# Patient Record
Sex: Male | Born: 1940 | Race: White | Hispanic: No | Marital: Married | Smoking: Current every day smoker
Health system: Southern US, Community
[De-identification: ages and names within clinical notes are randomized; demographics above are authoritative.]

## PROBLEM LIST (undated history)

## (undated) DIAGNOSIS — I499 Cardiac arrhythmia, unspecified: Secondary | ICD-10-CM

## (undated) DIAGNOSIS — C801 Malignant (primary) neoplasm, unspecified: Secondary | ICD-10-CM

## (undated) DIAGNOSIS — F32A Depression, unspecified: Secondary | ICD-10-CM

## (undated) DIAGNOSIS — R52 Pain, unspecified: Secondary | ICD-10-CM

## (undated) DIAGNOSIS — G2581 Restless legs syndrome: Secondary | ICD-10-CM

## (undated) DIAGNOSIS — M1990 Unspecified osteoarthritis, unspecified site: Secondary | ICD-10-CM

## (undated) DIAGNOSIS — F1010 Alcohol abuse, uncomplicated: Secondary | ICD-10-CM

## (undated) DIAGNOSIS — F329 Major depressive disorder, single episode, unspecified: Secondary | ICD-10-CM

## (undated) DIAGNOSIS — G629 Polyneuropathy, unspecified: Secondary | ICD-10-CM

## (undated) DIAGNOSIS — I10 Essential (primary) hypertension: Secondary | ICD-10-CM

## (undated) DIAGNOSIS — M8700 Idiopathic aseptic necrosis of unspecified bone: Secondary | ICD-10-CM

## (undated) DIAGNOSIS — F419 Anxiety disorder, unspecified: Secondary | ICD-10-CM

## (undated) DIAGNOSIS — W19XXXA Unspecified fall, initial encounter: Secondary | ICD-10-CM

## (undated) HISTORY — PX: EYE SURGERY: SHX253

## (undated) HISTORY — PX: HERNIA REPAIR: SHX51

## (undated) HISTORY — PX: KNEE ARTHROSCOPY W/ ACL RECONSTRUCTION: SHX1858

## (undated) HISTORY — PX: BRAIN SURGERY: SHX531

## (undated) HISTORY — PX: OTHER SURGICAL HISTORY: SHX169

## (undated) HISTORY — PX: JOINT REPLACEMENT: SHX530

## (undated) HISTORY — PX: FRACTURE SURGERY: SHX138

---

## 2011-06-23 ENCOUNTER — Emergency Department: Payer: Self-pay | Admitting: Emergency Medicine

## 2016-08-14 ENCOUNTER — Encounter: Payer: Self-pay | Admitting: *Deleted

## 2016-08-16 ENCOUNTER — Ambulatory Visit: Payer: No Typology Code available for payment source | Admitting: Certified Registered Nurse Anesthetist

## 2016-08-16 ENCOUNTER — Ambulatory Visit
Admission: RE | Admit: 2016-08-16 | Discharge: 2016-08-16 | Disposition: A | Discharge status: Home / Self Care | Payer: No Typology Code available for payment source | Source: Ambulatory Visit | Attending: Ophthalmology | Admitting: Ophthalmology

## 2016-08-16 ENCOUNTER — Encounter
Admission: RE | Disposition: A | Discharge status: Home / Self Care | Payer: Self-pay | Source: Ambulatory Visit | Attending: Ophthalmology

## 2016-08-16 DIAGNOSIS — M1990 Unspecified osteoarthritis, unspecified site: Secondary | ICD-10-CM | POA: Diagnosis not present

## 2016-08-16 DIAGNOSIS — G2581 Restless legs syndrome: Secondary | ICD-10-CM | POA: Insufficient documentation

## 2016-08-16 DIAGNOSIS — Z79899 Other long term (current) drug therapy: Secondary | ICD-10-CM | POA: Diagnosis not present

## 2016-08-16 DIAGNOSIS — G629 Polyneuropathy, unspecified: Secondary | ICD-10-CM | POA: Diagnosis not present

## 2016-08-16 DIAGNOSIS — H2511 Age-related nuclear cataract, right eye: Secondary | ICD-10-CM | POA: Insufficient documentation

## 2016-08-16 DIAGNOSIS — J449 Chronic obstructive pulmonary disease, unspecified: Secondary | ICD-10-CM | POA: Insufficient documentation

## 2016-08-16 DIAGNOSIS — F329 Major depressive disorder, single episode, unspecified: Secondary | ICD-10-CM | POA: Insufficient documentation

## 2016-08-16 DIAGNOSIS — I10 Essential (primary) hypertension: Secondary | ICD-10-CM | POA: Insufficient documentation

## 2016-08-16 DIAGNOSIS — E7800 Pure hypercholesterolemia, unspecified: Secondary | ICD-10-CM | POA: Insufficient documentation

## 2016-08-16 DIAGNOSIS — F419 Anxiety disorder, unspecified: Secondary | ICD-10-CM | POA: Diagnosis not present

## 2016-08-16 DIAGNOSIS — C9000 Multiple myeloma not having achieved remission: Secondary | ICD-10-CM | POA: Diagnosis not present

## 2016-08-16 DIAGNOSIS — F17200 Nicotine dependence, unspecified, uncomplicated: Secondary | ICD-10-CM | POA: Insufficient documentation

## 2016-08-16 HISTORY — DX: Cardiac arrhythmia, unspecified: I49.9

## 2016-08-16 HISTORY — PX: CATARACT EXTRACTION W/PHACO: SHX586

## 2016-08-16 HISTORY — DX: Pain, unspecified: R52

## 2016-08-16 HISTORY — DX: Polyneuropathy, unspecified: G62.9

## 2016-08-16 HISTORY — DX: Restless legs syndrome: G25.81

## 2016-08-16 HISTORY — DX: Malignant (primary) neoplasm, unspecified: C80.1

## 2016-08-16 HISTORY — DX: Anxiety disorder, unspecified: F41.9

## 2016-08-16 HISTORY — DX: Unspecified fall, initial encounter: W19.XXXA

## 2016-08-16 HISTORY — DX: Unspecified osteoarthritis, unspecified site: M19.90

## 2016-08-16 HISTORY — DX: Major depressive disorder, single episode, unspecified: F32.9

## 2016-08-16 HISTORY — DX: Idiopathic aseptic necrosis of unspecified bone: M87.00

## 2016-08-16 HISTORY — DX: Alcohol abuse, uncomplicated: F10.10

## 2016-08-16 HISTORY — DX: Depression, unspecified: F32.A

## 2016-08-16 SURGERY — PHACOEMULSIFICATION, CATARACT, WITH IOL INSERTION
Anesthesia: Monitor Anesthesia Care | Site: Eye | Laterality: Right | Wound class: Clean

## 2016-08-16 MED ORDER — MOXIFLOXACIN HCL 0.5 % OP SOLN
OPHTHALMIC | Status: AC
  Filled 2016-08-16: qty 3

## 2016-08-16 MED ORDER — LIDOCAINE HCL (PF) 4 % IJ SOLN
INTRAMUSCULAR | Status: AC
  Filled 2016-08-16: qty 5

## 2016-08-16 MED ORDER — SODIUM HYALURONATE 23 MG/ML IO SOLN
INTRAOCULAR | Status: AC
  Filled 2016-08-16: qty 0.6

## 2016-08-16 MED ORDER — MIDAZOLAM HCL 2 MG/2ML IJ SOLN
INTRAMUSCULAR | Status: DC | PRN
  Administered 2016-08-16: 1 mg via INTRAVENOUS

## 2016-08-16 MED ORDER — EPINEPHRINE PF 1 MG/ML IJ SOLN
INTRAMUSCULAR | Status: DC | PRN
  Administered 2016-08-16: 250 mL via OPHTHALMIC

## 2016-08-16 MED ORDER — POVIDONE-IODINE 5 % OP SOLN
OPHTHALMIC | Status: AC
  Filled 2016-08-16: qty 30

## 2016-08-16 MED ORDER — SODIUM CHLORIDE 0.9 % IV SOLN
INTRAVENOUS | Status: DC
  Administered 2016-08-16: 11:00:00 via INTRAVENOUS

## 2016-08-16 MED ORDER — FENTANYL CITRATE (PF) 100 MCG/2ML IJ SOLN
INTRAMUSCULAR | Status: DC | PRN
  Administered 2016-08-16: 50 ug via INTRAVENOUS

## 2016-08-16 MED ORDER — SODIUM HYALURONATE 23 MG/ML IO SOLN
INTRAOCULAR | Status: DC | PRN
  Administered 2016-08-16: 0.6 mL via INTRAOCULAR

## 2016-08-16 MED ORDER — MOXIFLOXACIN HCL 0.5 % OP SOLN
OPHTHALMIC | Status: DC | PRN
  Administered 2016-08-16: 5 [drp] via OPHTHALMIC

## 2016-08-16 MED ORDER — SODIUM HYALURONATE 10 MG/ML IO SOLN
INTRAOCULAR | Status: DC | PRN
  Administered 2016-08-16: 0.85 mL via INTRAOCULAR

## 2016-08-16 MED ORDER — SODIUM HYALURONATE 10 MG/ML IO SOLN
INTRAOCULAR | Status: AC
  Filled 2016-08-16: qty 0.85

## 2016-08-16 MED ORDER — EPINEPHRINE PF 1 MG/ML IJ SOLN
INTRAMUSCULAR | Status: AC
  Filled 2016-08-16: qty 2

## 2016-08-16 MED ORDER — CYCLOPENTOLATE HCL 2 % OP SOLN
OPHTHALMIC | Status: AC
  Filled 2016-08-16: qty 2

## 2016-08-16 MED ORDER — PHENYLEPHRINE HCL 10 % OP SOLN
OPHTHALMIC | Status: AC
  Filled 2016-08-16: qty 5

## 2016-08-16 MED ORDER — ARMC OPHTHALMIC DILATING DROPS
1.0000 "application " | OPHTHALMIC | Status: AC
  Administered 2016-08-16 (×3): 1 via OPHTHALMIC

## 2016-08-16 MED ORDER — LIDOCAINE HCL (PF) 4 % IJ SOLN
INTRAOCULAR | Status: DC | PRN
  Administered 2016-08-16: 4 mL via OPHTHALMIC

## 2016-08-16 MED ORDER — MOXIFLOXACIN HCL 0.5 % OP SOLN: 1.0000 [drp] | OPHTHALMIC | Status: DC | PRN

## 2016-08-16 SURGICAL SUPPLY — 21 items
CANNULA ANT/CHMB 27GA (MISCELLANEOUS) ×6 IMPLANT
CUP MEDICINE 2OZ PLAST GRAD ST (MISCELLANEOUS) ×3 IMPLANT
GLOVE BIO SURGEON STRL SZ8 (GLOVE) ×3 IMPLANT
GLOVE BIOGEL M 6.5 STRL (GLOVE) ×3 IMPLANT
GLOVE SURG LX 7.5 STRW (GLOVE) ×2
GLOVE SURG LX STRL 7.5 STRW (GLOVE) ×1 IMPLANT
GOWN STRL REUS W/ TWL LRG LVL3 (GOWN DISPOSABLE) ×2 IMPLANT
GOWN STRL REUS W/TWL LRG LVL3 (GOWN DISPOSABLE) ×4
LENS IOL TECNIS ITEC 20.5 (Intraocular Lens) ×3 IMPLANT
PACK CATARACT (MISCELLANEOUS) ×3 IMPLANT
PACK CATARACT BRASINGTON LX (MISCELLANEOUS) ×3 IMPLANT
PACK EYE AFTER SURG (MISCELLANEOUS) ×3 IMPLANT
SOL BSS BAG (MISCELLANEOUS) ×3
SOL PREP PVP 2OZ (MISCELLANEOUS) ×3
SOLUTION BSS BAG (MISCELLANEOUS) ×1 IMPLANT
SOLUTION PREP PVP 2OZ (MISCELLANEOUS) ×1 IMPLANT
SYR 3ML LL SCALE MARK (SYRINGE) ×6 IMPLANT
SYR 5ML LL (SYRINGE) ×3 IMPLANT
SYR TB 1ML 27GX1/2 LL (SYRINGE) ×3 IMPLANT
WATER STERILE IRR 250ML POUR (IV SOLUTION) ×3 IMPLANT
WIPE NON LINTING 3.25X3.25 (MISCELLANEOUS) ×3 IMPLANT

## 2016-08-16 NOTE — H&P (Signed)
The History and Physical notes are on paper, have been signed, and are to be scanned. The patient remains stable and unchanged from the H&P.   Previous H&P reviewed, patient examined, and there are no changes.  Benay Pillow 08/16/2016 12:14 PM

## 2016-08-16 NOTE — Discharge Instructions (Signed)
Eye Surgery Discharge Instructions  Expect mild scratchy sensation or mild soreness. DO NOT RUB YOUR EYE!  The day of surgery:  Minimal physical activity, but bed rest is not required  No reading, computer work, or close hand work  No bending, lifting, or straining.  May watch TV  For 24 hours:  No driving, legal decisions, or alcoholic beverages  Safety precautions  Eat anything you prefer: It is better to start with liquids, then soup then solid foods.  _____ Eye patch should be worn until postoperative exam tomorrow.  ____ Solar shield eyeglasses should be worn for comfort in the sunlight/patch while sleeping  Resume all regular medications including aspirin or Coumadin if these were discontinued prior to surgery. You may shower, bathe, shave, or wash your hair. Tylenol may be taken for mild discomfort.  Call your doctor if you experience significant pain, nausea, or vomiting, fever > 101 or other signs of infection. [phone] or [phone] Specific instructions:  Follow-up Information    Benay Pillow, MD Follow up on 08/17/2016.   Specialty:  Ophthalmology Why:  8:50 am Contact information: [place] [address] [phone]          General Anesthesia, Adult, Care After Refer to this sheet in the next few weeks. These instructions provide you with information on caring for yourself after your procedure. Your health care provider may also give you more specific instructions. Your treatment has been planned according to current medical practices, but problems sometimes occur. Call your health care provider if you have any problems or questions after your procedure. WHAT TO EXPECT AFTER THE PROCEDURE After the procedure, it is typical to experience:  Sleepiness.  Nausea and vomiting. HOME CARE INSTRUCTIONS  For the first 24 hours after general anesthesia:  Have a responsible person with you.  Do not drive a car. If you are alone, do  not take public transportation.  Do not drink alcohol.  Do not take medicine that has not been prescribed by your health care provider.  Do not sign important papers or make important decisions.  You may resume a normal diet and activities as directed by your health care provider.  Change bandages (dressings) as directed.  If you have questions or problems that seem related to general anesthesia, call the hospital and ask for the anesthetist or anesthesiologist on call. SEEK MEDICAL CARE IF:  You have nausea and vomiting that continue the day after anesthesia.  You develop a rash. SEEK IMMEDIATE MEDICAL CARE IF:   You have difficulty breathing.  You have chest pain.  You have any allergic problems.   This information is not intended to replace advice given to you by your health care provider. Make sure you discuss any questions you have with your health care provider.   Document Released: 01/28/2001 Document Revised: 11/12/2014 Document Reviewed: 02/20/2012 Elsevier Interactive Patient Education [employer].

## 2016-08-16 NOTE — Anesthesia Preprocedure Evaluation (Signed)
Anesthesia Evaluation  Patient identified by MRN, date of birth, ID band Patient awake    Reviewed: Allergy & Precautions, NPO status , Patient's Chart, lab work & pertinent test results  Airway Mallampati: II       Dental  (+) Teeth Intact   Pulmonary neg pulmonary ROS, COPD, Current Smoker,     + decreased breath sounds      Cardiovascular Exercise Tolerance: Good hypertension, Pt. on medications and Pt. on home beta blockers  Rhythm:Regular Rate:Normal     Neuro/Psych Anxiety Depression Hx of subd. heamatoma    GI/Hepatic negative GI ROS, Neg liver ROS,   Endo/Other  negative endocrine ROS  Renal/GU negative Renal ROS     Musculoskeletal   Abdominal   Peds  Hematology   Anesthesia Other Findings   Reproductive/Obstetrics                             Anesthesia Physical Anesthesia Plan  ASA: III  Anesthesia Plan: MAC   Post-op Pain Management:    Induction: Intravenous  Airway Management Planned: Natural Airway and Nasal Cannula  Additional Equipment:   Intra-op Plan:   Post-operative Plan:   Informed Consent: I have reviewed the patients History and Physical, chart, labs and discussed the procedure including the risks, benefits and alternatives for the proposed anesthesia with the patient or authorized representative who has indicated his/her understanding and acceptance.     Plan Discussed with: CRNA  Anesthesia Plan Comments:         Anesthesia Quick Evaluation

## 2016-08-16 NOTE — Anesthesia Postprocedure Evaluation (Signed)
Anesthesia Post Note  Patient: KEMONTAE NAZZAL  Procedure(s) Performed: Procedure(s) (LRB): CATARACT EXTRACTION PHACO AND INTRAOCULAR LENS PLACEMENT (IOC) (Right)  Patient location during evaluation: PACU Anesthesia Type: MAC Level of consciousness: awake and alert and oriented Pain management: satisfactory to patient Vital Signs Assessment: post-procedure vital signs reviewed and stable Respiratory status: respiratory function stable Cardiovascular status: blood pressure returned to baseline    Last Vitals:  Vitals:   08/16/16 1107  BP: 130/82  Pulse: 64  Resp: 16  Temp: 36.7 C    Last Pain:  Vitals:   08/16/16 1107  TempSrc: Oral                 Blima Singer

## 2016-08-16 NOTE — Transfer of Care (Signed)
Immediate Anesthesia Transfer of Care Note  Patient: Ryan Travis  Procedure(s) Performed: Procedure(s) with comments: CATARACT EXTRACTION PHACO AND INTRAOCULAR LENS PLACEMENT (IOC) (Right) - Lot # [id number] H [place]: 01:00.8 AP%:16.5 CDE: 10.02   Patient Location: PACU  Anesthesia Type:MAC  Level of Consciousness: awake, alert  and oriented  Airway & Oxygen Therapy: Patient Spontanous Breathing  Post-op Assessment: Report given to RN and Post -op Vital signs reviewed and stable  Post vital signs: Reviewed and stable  Last Vitals:  Vitals:   08/16/16 1107  BP: 130/82  Pulse: 64  Resp: 16  Temp: 36.7 C    Last Pain:  Vitals:   08/16/16 1107  TempSrc: Oral         Complications: No apparent anesthesia complications

## 2016-08-16 NOTE — Anesthesia Procedure Notes (Signed)
Procedure Name: MAC Performed by: Jamillah Camilo Pre-anesthesia Checklist: Patient identified, Emergency Drugs available, Suction available, Patient being monitored and Timeout performed Oxygen Delivery Method: Nasal cannula       

## 2016-08-16 NOTE — Op Note (Signed)
OPERATIVE NOTE  Ryan Travis [id number] 08/16/2016   PREOPERATIVE DIAGNOSIS:  Nuclear sclerotic cataract right eye.  H25.11   POSTOPERATIVE DIAGNOSIS:    Nuclear sclerotic cataract right eye.     PROCEDURE:  Phacoemusification with posterior chamber intraocular lens placement of the right eye   LENS:   Implant Name Type Inv. Item Serial No. Manufacturer Lot No. LRB No. Used  LENS IOL DIOP 20.5 - [id number] Intraocular Lens LENS IOL DIOP 20.5 [id number] AMO   Right 1       PCB00 +20.5   ULTRASOUND TIME: 1 minutes 00 seconds.  CDE 10.02   SURGEON:  Benay Pillow, MD, MPH  ANESTHESIOLOGIST: Anesthesiologist: Iver Nestle, MD CRNA: Demetrius Charity, CRNA   ANESTHESIA:  Topical with tetracaine drops, augmented with 1% preservative-free intracameral lidocaine.  ESTIMATED BLOOD LOSS: less than 1 mL.   COMPLICATIONS:  None.   DESCRIPTION OF PROCEDURE:  The patient was identified in the holding room and transported to the operating room and placed in the supine position under the operating microscope.  The right eye was identified as the operative eye and it was prepped and draped in the usual sterile ophthalmic fashion.   A 1.0 millimeter clear-corneal paracentesis was made at the 10:30 position. 0.5 ml of preservative-free 1% lidocaine with epinephrine was injected into the anterior chamber.  The anterior chamber was filled with Healon 5 viscoelastic.  A 2.4 millimeter keratome was used to make a near-clear corneal incision at the 8:00 position.  A curvilinear capsulorrhexis was made with a cystotome and capsulorrhexis forceps.  Balanced salt solution was used to hydrodissect and hydrodelineate the nucleus.   Phacoemulsification was then used in stop and chop fashion to remove the lens nucleus and epinucleus.  The remaining cortex was then removed using the irrigation and aspiration handpiece. Healon was then placed into the capsular bag to distend it for lens  placement.  A lens was then injected into the capsular bag.  The remaining viscoelastic was aspirated.   Wounds were hydrated with balanced salt solution.  The anterior chamber was inflated to a physiologic pressure with balanced salt solution.    Intracameral vigamox 0.1 mL undiluted was injected into the eye.  No wound leaks were noted.  Topical Vigamox drops were applied to the eye.  The patient was taken to the recovery room in stable condition without complications of anesthesia or surgery  Benay Pillow 08/16/2016, 1:12 PM

## 2016-09-06 ENCOUNTER — Ambulatory Visit: Admit: 2016-09-06 | Payer: No Typology Code available for payment source | Admitting: Ophthalmology

## 2016-09-06 SURGERY — PHACOEMULSIFICATION, CATARACT, WITH IOL INSERTION
Anesthesia: Choice | Laterality: Left

## 2016-10-02 ENCOUNTER — Encounter: Payer: Self-pay | Admitting: *Deleted

## 2016-10-04 ENCOUNTER — Ambulatory Visit
Admission: RE | Admit: 2016-10-04 | Discharge: 2016-10-04 | Disposition: A | Discharge status: Home / Self Care | Payer: No Typology Code available for payment source | Source: Ambulatory Visit | Attending: Ophthalmology | Admitting: Ophthalmology

## 2016-10-04 ENCOUNTER — Ambulatory Visit: Payer: No Typology Code available for payment source | Admitting: Anesthesiology

## 2016-10-04 ENCOUNTER — Encounter: Payer: Self-pay | Admitting: *Deleted

## 2016-10-04 ENCOUNTER — Encounter
Admission: RE | Disposition: A | Discharge status: Home / Self Care | Payer: Self-pay | Source: Ambulatory Visit | Attending: Ophthalmology

## 2016-10-04 DIAGNOSIS — M879 Osteonecrosis, unspecified: Secondary | ICD-10-CM | POA: Diagnosis not present

## 2016-10-04 DIAGNOSIS — G629 Polyneuropathy, unspecified: Secondary | ICD-10-CM | POA: Diagnosis not present

## 2016-10-04 DIAGNOSIS — F17200 Nicotine dependence, unspecified, uncomplicated: Secondary | ICD-10-CM | POA: Diagnosis not present

## 2016-10-04 DIAGNOSIS — Z85828 Personal history of other malignant neoplasm of skin: Secondary | ICD-10-CM | POA: Diagnosis not present

## 2016-10-04 DIAGNOSIS — M1990 Unspecified osteoarthritis, unspecified site: Secondary | ICD-10-CM | POA: Diagnosis not present

## 2016-10-04 DIAGNOSIS — F329 Major depressive disorder, single episode, unspecified: Secondary | ICD-10-CM | POA: Diagnosis not present

## 2016-10-04 DIAGNOSIS — I10 Essential (primary) hypertension: Secondary | ICD-10-CM | POA: Insufficient documentation

## 2016-10-04 DIAGNOSIS — F419 Anxiety disorder, unspecified: Secondary | ICD-10-CM | POA: Insufficient documentation

## 2016-10-04 DIAGNOSIS — H2512 Age-related nuclear cataract, left eye: Secondary | ICD-10-CM | POA: Insufficient documentation

## 2016-10-04 HISTORY — DX: Essential (primary) hypertension: I10

## 2016-10-04 HISTORY — PX: CATARACT EXTRACTION W/PHACO: SHX586

## 2016-10-04 SURGERY — PHACOEMULSIFICATION, CATARACT, WITH IOL INSERTION
Anesthesia: Monitor Anesthesia Care | Site: Eye | Laterality: Left | Wound class: Clean

## 2016-10-04 MED ORDER — SODIUM HYALURONATE 10 MG/ML IO SOLN
INTRAOCULAR | Status: AC
  Filled 2016-10-04: qty 0.85

## 2016-10-04 MED ORDER — MOXIFLOXACIN HCL 0.5 % OP SOLN
OPHTHALMIC | Status: DC | PRN
  Administered 2016-10-04: 1 [drp] via OPHTHALMIC

## 2016-10-04 MED ORDER — FENTANYL CITRATE (PF) 100 MCG/2ML IJ SOLN
INTRAMUSCULAR | Status: DC | PRN
  Administered 2016-10-04: 50 ug via INTRAVENOUS

## 2016-10-04 MED ORDER — MOXIFLOXACIN HCL 0.5 % OP SOLN
OPHTHALMIC | Status: AC
  Filled 2016-10-04: qty 3

## 2016-10-04 MED ORDER — POVIDONE-IODINE 5 % OP SOLN
OPHTHALMIC | Status: DC | PRN
Start: 2016-10-04 — End: 2016-10-04
  Administered 2016-10-04: 1 via OPHTHALMIC

## 2016-10-04 MED ORDER — SODIUM HYALURONATE 10 MG/ML IO SOLN
INTRAOCULAR | Status: DC | PRN
  Administered 2016-10-04: 0.85 mL via INTRAOCULAR

## 2016-10-04 MED ORDER — EPINEPHRINE PF 1 MG/ML IJ SOLN
INTRAOCULAR | Status: DC | PRN
  Administered 2016-10-04: 10:00:00 via OPHTHALMIC

## 2016-10-04 MED ORDER — LIDOCAINE HCL (PF) 4 % IJ SOLN
INTRAOCULAR | Status: DC | PRN
  Administered 2016-10-04: 4 mL via OPHTHALMIC

## 2016-10-04 MED ORDER — CARBACHOL 0.01 % IO SOLN
INTRAOCULAR | Status: DC | PRN
  Administered 2016-10-04: 0.5 mL via INTRAOCULAR

## 2016-10-04 MED ORDER — POVIDONE-IODINE 5 % OP SOLN
OPHTHALMIC | Status: AC
  Filled 2016-10-04: qty 30

## 2016-10-04 MED ORDER — ARMC OPHTHALMIC DILATING DROPS
OPHTHALMIC | Status: AC
  Filled 2016-10-04: qty 0.4

## 2016-10-04 MED ORDER — MOXIFLOXACIN HCL 0.5 % OP SOLN: 1.0000 [drp] | OPHTHALMIC | Status: DC | PRN

## 2016-10-04 MED ORDER — EPINEPHRINE PF 1 MG/ML IJ SOLN
INTRAMUSCULAR | Status: AC
  Filled 2016-10-04: qty 2

## 2016-10-04 MED ORDER — SODIUM HYALURONATE 23 MG/ML IO SOLN
INTRAOCULAR | Status: DC | PRN
  Administered 2016-10-04: 0.6 mL via INTRAOCULAR

## 2016-10-04 MED ORDER — ARMC OPHTHALMIC DILATING DROPS
1.0000 "application " | OPHTHALMIC | Status: AC
  Administered 2016-10-04 (×3): 1 via OPHTHALMIC

## 2016-10-04 MED ORDER — LIDOCAINE HCL (PF) 4 % IJ SOLN
INTRAMUSCULAR | Status: AC
  Filled 2016-10-04: qty 5

## 2016-10-04 MED ORDER — SODIUM HYALURONATE 23 MG/ML IO SOLN
INTRAOCULAR | Status: AC
  Filled 2016-10-04: qty 0.6

## 2016-10-04 MED ORDER — MIDAZOLAM HCL 2 MG/2ML IJ SOLN
INTRAMUSCULAR | Status: DC | PRN
  Administered 2016-10-04: 1 mg via INTRAVENOUS

## 2016-10-04 MED ORDER — SODIUM CHLORIDE 0.9 % IV SOLN
INTRAVENOUS | Status: DC
  Administered 2016-10-04: 09:00:00 via INTRAVENOUS

## 2016-10-04 SURGICAL SUPPLY — 23 items
CANNULA ANT/CHMB 27GA (MISCELLANEOUS) ×6 IMPLANT
CUP MEDICINE 2OZ PLAST GRAD ST (MISCELLANEOUS) ×3 IMPLANT
DISSECTOR HYDRO NUCLEUS 50X22 (MISCELLANEOUS) ×3 IMPLANT
GLOVE BIO SURGEON STRL SZ8 (GLOVE) ×3 IMPLANT
GLOVE BIOGEL M 6.5 STRL (GLOVE) ×3 IMPLANT
GLOVE SURG LX 7.5 STRW (GLOVE) ×2
GLOVE SURG LX STRL 7.5 STRW (GLOVE) ×1 IMPLANT
GOWN STRL REUS W/ TWL LRG LVL3 (GOWN DISPOSABLE) ×2 IMPLANT
GOWN STRL REUS W/TWL LRG LVL3 (GOWN DISPOSABLE) ×4
LENS IOL TECNIS ITEC 17.5 (Intraocular Lens) ×3 IMPLANT
NEEDLE CAPSULORHEX 25GA (NEEDLE) ×3 IMPLANT
PACK CATARACT (MISCELLANEOUS) ×3 IMPLANT
PACK CATARACT BRASINGTON LX (MISCELLANEOUS) ×3 IMPLANT
PACK EYE AFTER SURG (MISCELLANEOUS) ×3 IMPLANT
SOL BSS BAG (MISCELLANEOUS) ×3
SOL PREP PVP 2OZ (MISCELLANEOUS) ×3
SOLUTION BSS BAG (MISCELLANEOUS) ×1 IMPLANT
SOLUTION PREP PVP 2OZ (MISCELLANEOUS) ×1 IMPLANT
SYR 3ML LL SCALE MARK (SYRINGE) ×6 IMPLANT
SYR 5ML LL (SYRINGE) ×3 IMPLANT
SYR TB 1ML 27GX1/2 LL (SYRINGE) ×3 IMPLANT
WATER STERILE IRR 250ML POUR (IV SOLUTION) ×3 IMPLANT
WIPE NON LINTING 3.25X3.25 (MISCELLANEOUS) ×3 IMPLANT

## 2016-10-04 NOTE — Transfer of Care (Signed)
Immediate Anesthesia Transfer of Care Note  Patient: [name]  Procedure(s) Performed: Procedure(s) with comments: CATARACT EXTRACTION PHACO AND INTRAOCULAR LENS PLACEMENT (IOC) (Left) - [place] 35.4 AP% 10.5 CDE 4.11 Fluid pack lot # [id number]  Patient Location: PACU  Anesthesia Type:MAC  Level of Consciousness: awake, alert  and oriented  Airway & Oxygen Therapy: Patient Spontanous Breathing  Post-op Assessment: Report given to RN and Post -op Vital signs reviewed and stable  Post vital signs: Reviewed and stable  Last Vitals:  Vitals:   10/04/16 0849 10/04/16 1050  BP: 111/73 127/88  Pulse: 88 72  Resp: 20 18  Temp: 36.5 C     Last Pain:  Vitals:   10/04/16 1050  TempSrc: Temporal         Complications: No apparent anesthesia complications

## 2016-10-04 NOTE — OR Nursing (Signed)
IV site clear

## 2016-10-04 NOTE — H&P (Signed)
The History and Physical notes are on paper, have been signed, and are to be scanned. The patient remains stable and unchanged from the H&P.   Previous H&P reviewed, patient examined, and there are no changes.  Ryan Travis 10/04/2016 10:11 AM

## 2016-10-04 NOTE — Anesthesia Preprocedure Evaluation (Signed)
Anesthesia Evaluation  Patient identified by MRN, date of birth, ID band Patient awake    Reviewed: Allergy & Precautions, NPO status , Patient's Chart, lab work & pertinent test results  History of Anesthesia Complications Negative for: history of anesthetic complications  Airway Mallampati: II  TM Distance: >3 FB Neck ROM: Full    Dental no notable dental hx.    Pulmonary neg sleep apnea, neg COPD, Current Smoker,    breath sounds clear to auscultation- rhonchi (-) wheezing      Cardiovascular Exercise Tolerance: Good hypertension, (-) CAD and (-) Past MI  Rhythm:Regular Rate:Normal - Systolic murmurs and - Diastolic murmurs    Neuro/Psych Anxiety Depression negative neurological ROS     GI/Hepatic negative GI ROS, Neg liver ROS,   Endo/Other  negative endocrine ROSneg diabetes  Renal/GU negative Renal ROS     Musculoskeletal  (+) Arthritis ,   Abdominal (+) - obese,   Peds  Hematology   Anesthesia Other Findings Past Medical History: No date: Alcohol abuse No date: Anxiety No date: Arthritis 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[facility]) No date: Cancer ([place])     Comment: multiple myeloma No date: Depression No date: Dysrhythmia No date: Fall     Comment: 3 weeks ago/ torn ligaments left knee/wearing               brace/pt No date: Hypertension No date: Neuropathy ([place]) No date: Pain     Comment: chronic leg and ankle pain No date: RLS (restless legs syndrome)   Reproductive/Obstetrics                             Anesthesia Physical Anesthesia Plan  ASA: II  Anesthesia Plan: MAC   Post-op Pain Management:    Induction: Intravenous  Airway Management Planned: Natural Airway  Additional Equipment:   Intra-op Plan:   Post-operative Plan:   Informed Consent: I have reviewed the patients History and Physical, chart, labs and discussed the procedure including the risks,  benefits and alternatives for the proposed anesthesia with the patient or authorized representative who has indicated his/her understanding and acceptance.   Dental advisory given  Plan Discussed with: CRNA and Anesthesiologist  Anesthesia Plan Comments:         Anesthesia Quick Evaluation

## 2016-10-04 NOTE — Anesthesia Postprocedure Evaluation (Signed)
Anesthesia Post Note  Patient: TREVIOUS FOUCHE  Procedure(s) Performed: Procedure(s) (LRB): CATARACT EXTRACTION PHACO AND INTRAOCULAR LENS PLACEMENT (IOC) (Left)  Patient location during evaluation: PACU Anesthesia Type: MAC Level of consciousness: awake and alert Pain management: pain level controlled Vital Signs Assessment: post-procedure vital signs reviewed and stable Respiratory status: spontaneous breathing, nonlabored ventilation and respiratory function stable Cardiovascular status: stable and blood pressure returned to baseline Anesthetic complications: no    Last Vitals:  Vitals:   10/04/16 0849 10/04/16 1050  BP: 111/73 127/88  Pulse: 88 72  Resp: 20 18  Temp: 36.5 C 36.7 C    Last Pain:  Vitals:   10/04/16 1050  TempSrc: Temporal                 Shakeeta Godette

## 2016-10-04 NOTE — Discharge Instructions (Signed)
Eye Surgery Discharge Instructions  Expect mild scratchy sensation or mild soreness. DO NOT RUB YOUR EYE!  The day of surgery: . Minimal physical activity, but bed rest is not required . No reading, computer work, or close hand work . No bending, lifting, or straining. . May watch TV  For 24 hours: . No driving, legal decisions, or alcoholic beverages . Safety precautions . Eat anything you prefer: It is better to start with liquids, then soup then solid foods. . _____ Eye patch should be worn until postoperative exam tomorrow. . ____ Solar shield eyeglasses should be worn for comfort in the sunlight/patch while sleeping  Resume all regular medications including aspirin or Coumadin if these were discontinued prior to surgery. You may shower, bathe, shave, or wash your hair. Tylenol may be taken for mild discomfort.  Call your doctor if you experience significant pain, nausea, or vomiting, fever > 101 or other signs of infection. [phone] or [phone] Specific instructions:  Follow-up Information    Benay Pillow, MD Follow up on 10/05/2016.   Specialty:  Ophthalmology Why:  at9:40 am Contact information: [address] [place] [address] [phone]

## 2016-10-04 NOTE — Op Note (Signed)
OPERATIVE NOTE  Ryan Travis [id number] 10/04/2016   PREOPERATIVE DIAGNOSIS:  Nuclear sclerotic cataract left eye.  H25.12   POSTOPERATIVE DIAGNOSIS:    Nuclear sclerotic cataract left eye.     PROCEDURE:  Phacoemusification with posterior chamber intraocular lens placement of the left eye   LENS:   Implant Name Type Inv. Item Serial No. Manufacturer Lot No. LRB No. Used  LENS IOL DIOP 17.5 - [id number] Intraocular Lens LENS IOL DIOP 17.5 [id number] AMO   Left 1       PCB00 +17.5 D IOL (plano target, monovision).   ULTRASOUND TIME: 0  minutes 35 seconds.  CDE 4.11   SURGEON:  Benay Pillow, MD, MPH   ANESTHESIA:  Topical with tetracaine drops augmented with 1% preservative-free intracameral lidocaine.   COMPLICATIONS:  None.   DESCRIPTION OF PROCEDURE:  The patient was identified in the holding room and transported to the operating room and placed in the supine position under the operating microscope.  The left eye was identified as the operative eye and it was prepped and draped in the usual sterile ophthalmic fashion.   A 1.0 millimeter clear-corneal paracentesis was made at the 5:00 position. 0.5 ml of preservative-free 1% lidocaine with epinephrine was injected into the anterior chamber.  The anterior chamber was filled with Healon 5 viscoelastic.  A 2.4 millimeter keratome was used to make a near-clear corneal incision at the 2:00 position.  A curvilinear capsulorrhexis was made with a cystotome and capsulorrhexis forceps.  Balanced salt solution was used to hydrodissect and hydrodelineate the nucleus.   Phacoemulsification was then used in stop and chop fashion to remove the lens nucleus and epinucleus.  The remaining cortex was then removed using the irrigation and aspiration handpiece. Healon was then placed into the capsular bag to distend it for lens placement.  A lens was then injected into the capsular bag.  The remaining viscoelastic was aspirated.   Wounds were  hydrated with balanced salt solution.  The anterior chamber was inflated to a physiologic pressure with balanced salt solution.   Intracameral vigamox 0.1 mL undiltued was injected into the eye and a drop placed onto the ocular surface.   No wound leaks were noted.  The patient was taken to the recovery room in stable condition without complications of anesthesia or surgery  Benay Pillow 10/04/2016, 10:47 AM

## 2016-10-16 ENCOUNTER — Encounter
Admission: EM | Disposition: A | Discharge status: Home / Self Care | Payer: Self-pay | Source: Home / Self Care | Attending: Internal Medicine

## 2016-10-16 ENCOUNTER — Encounter: Payer: Self-pay | Admitting: Emergency Medicine

## 2016-10-16 ENCOUNTER — Emergency Department: Payer: Non-veteran care

## 2016-10-16 ENCOUNTER — Inpatient Hospital Stay
Admission: EM | Admit: 2016-10-16 | Discharge: 2016-10-19 | DRG: 246 | Disposition: A | Discharge status: Home / Self Care | Payer: Non-veteran care | Attending: Internal Medicine | Admitting: Internal Medicine

## 2016-10-16 DIAGNOSIS — Z885 Allergy status to narcotic agent status: Secondary | ICD-10-CM | POA: Diagnosis not present

## 2016-10-16 DIAGNOSIS — Z888 Allergy status to other drugs, medicaments and biological substances status: Secondary | ICD-10-CM

## 2016-10-16 DIAGNOSIS — Z880 Allergy status to penicillin: Secondary | ICD-10-CM

## 2016-10-16 DIAGNOSIS — I10 Essential (primary) hypertension: Secondary | ICD-10-CM | POA: Diagnosis present

## 2016-10-16 DIAGNOSIS — G629 Polyneuropathy, unspecified: Secondary | ICD-10-CM | POA: Diagnosis present

## 2016-10-16 DIAGNOSIS — R296 Repeated falls: Secondary | ICD-10-CM | POA: Diagnosis present

## 2016-10-16 DIAGNOSIS — R570 Cardiogenic shock: Secondary | ICD-10-CM | POA: Diagnosis not present

## 2016-10-16 DIAGNOSIS — C9000 Multiple myeloma not having achieved remission: Secondary | ICD-10-CM | POA: Diagnosis present

## 2016-10-16 DIAGNOSIS — I639 Cerebral infarction, unspecified: Secondary | ICD-10-CM | POA: Diagnosis not present

## 2016-10-16 DIAGNOSIS — I4891 Unspecified atrial fibrillation: Secondary | ICD-10-CM | POA: Diagnosis not present

## 2016-10-16 DIAGNOSIS — G894 Chronic pain syndrome: Secondary | ICD-10-CM | POA: Diagnosis present

## 2016-10-16 DIAGNOSIS — R262 Difficulty in walking, not elsewhere classified: Secondary | ICD-10-CM

## 2016-10-16 DIAGNOSIS — I252 Old myocardial infarction: Secondary | ICD-10-CM

## 2016-10-16 DIAGNOSIS — W19XXXA Unspecified fall, initial encounter: Secondary | ICD-10-CM | POA: Diagnosis present

## 2016-10-16 DIAGNOSIS — F329 Major depressive disorder, single episode, unspecified: Secondary | ICD-10-CM | POA: Diagnosis present

## 2016-10-16 DIAGNOSIS — I2510 Atherosclerotic heart disease of native coronary artery without angina pectoris: Secondary | ICD-10-CM | POA: Diagnosis not present

## 2016-10-16 DIAGNOSIS — R001 Bradycardia, unspecified: Secondary | ICD-10-CM | POA: Diagnosis present

## 2016-10-16 DIAGNOSIS — F419 Anxiety disorder, unspecified: Secondary | ICD-10-CM | POA: Diagnosis present

## 2016-10-16 DIAGNOSIS — I2111 ST elevation (STEMI) myocardial infarction involving right coronary artery: Secondary | ICD-10-CM | POA: Diagnosis present

## 2016-10-16 DIAGNOSIS — G2581 Restless legs syndrome: Secondary | ICD-10-CM | POA: Diagnosis present

## 2016-10-16 DIAGNOSIS — I2119 ST elevation (STEMI) myocardial infarction involving other coronary artery of inferior wall: Secondary | ICD-10-CM | POA: Diagnosis present

## 2016-10-16 DIAGNOSIS — I213 ST elevation (STEMI) myocardial infarction of unspecified site: Secondary | ICD-10-CM | POA: Diagnosis present

## 2016-10-16 DIAGNOSIS — I480 Paroxysmal atrial fibrillation: Secondary | ICD-10-CM | POA: Diagnosis not present

## 2016-10-16 DIAGNOSIS — E876 Hypokalemia: Secondary | ICD-10-CM | POA: Diagnosis present

## 2016-10-16 DIAGNOSIS — R519 Headache, unspecified: Secondary | ICD-10-CM

## 2016-10-16 DIAGNOSIS — G8929 Other chronic pain: Secondary | ICD-10-CM | POA: Diagnosis present

## 2016-10-16 DIAGNOSIS — Z79899 Other long term (current) drug therapy: Secondary | ICD-10-CM

## 2016-10-16 DIAGNOSIS — E871 Hypo-osmolality and hyponatremia: Secondary | ICD-10-CM | POA: Diagnosis present

## 2016-10-16 DIAGNOSIS — L8990 Pressure ulcer of unspecified site, unspecified stage: Secondary | ICD-10-CM | POA: Insufficient documentation

## 2016-10-16 DIAGNOSIS — R2681 Unsteadiness on feet: Secondary | ICD-10-CM

## 2016-10-16 DIAGNOSIS — M6281 Muscle weakness (generalized): Secondary | ICD-10-CM

## 2016-10-16 DIAGNOSIS — F17210 Nicotine dependence, cigarettes, uncomplicated: Secondary | ICD-10-CM | POA: Diagnosis present

## 2016-10-16 DIAGNOSIS — Z79891 Long term (current) use of opiate analgesic: Secondary | ICD-10-CM

## 2016-10-16 DIAGNOSIS — R51 Headache: Secondary | ICD-10-CM | POA: Diagnosis not present

## 2016-10-16 DIAGNOSIS — I219 Acute myocardial infarction, unspecified: Secondary | ICD-10-CM | POA: Diagnosis present

## 2016-10-16 DIAGNOSIS — M25579 Pain in unspecified ankle and joints of unspecified foot: Secondary | ICD-10-CM | POA: Diagnosis present

## 2016-10-16 HISTORY — PX: CARDIAC CATHETERIZATION: SHX172

## 2016-10-16 LAB — COMPREHENSIVE METABOLIC PANEL
ALK PHOS: 49 U/L (ref 38–126)
ALT: 28 U/L (ref 17–63)
AST: 78 U/L — ABNORMAL HIGH (ref 15–41)
Albumin: 3.8 g/dL (ref 3.5–5.0)
Anion gap: 7 (ref 5–15)
BILIRUBIN TOTAL: 0.7 mg/dL (ref 0.3–1.2)
BUN: 21 mg/dL — ABNORMAL HIGH (ref 6–20)
CALCIUM: 8.6 mg/dL — AB (ref 8.9–10.3)
CO2: 23 mmol/L (ref 22–32)
CREATININE: 0.92 mg/dL (ref 0.61–1.24)
Chloride: 104 mmol/L (ref 101–111)
Glucose, Bld: 110 mg/dL — ABNORMAL HIGH (ref 65–99)
Potassium: 3.4 mmol/L — ABNORMAL LOW (ref 3.5–5.1)
Sodium: 134 mmol/L — ABNORMAL LOW (ref 135–145)
TOTAL PROTEIN: 7.3 g/dL (ref 6.5–8.1)

## 2016-10-16 LAB — CBC WITH DIFFERENTIAL/PLATELET
Basophils Absolute: 0 10*3/uL (ref 0–0.1)
Basophils Relative: 0 %
Eosinophils Absolute: 0 10*3/uL (ref 0–0.7)
Eosinophils Relative: 0 %
HEMATOCRIT: 46 % (ref 40.0–52.0)
HEMOGLOBIN: 15.5 g/dL (ref 13.0–18.0)
LYMPHS ABS: 1.9 10*3/uL (ref 1.0–3.6)
LYMPHS PCT: 13 %
MCH: 35.4 pg — AB (ref 26.0–34.0)
MCHC: 33.7 g/dL (ref 32.0–36.0)
MCV: 105 fL — AB (ref 80.0–100.0)
MONOS PCT: 7 %
Monocytes Absolute: 1 10*3/uL (ref 0.2–1.0)
NEUTROS PCT: 80 %
Neutro Abs: 11.1 10*3/uL — ABNORMAL HIGH (ref 1.4–6.5)
Platelets: 183 10*3/uL (ref 150–440)
RBC: 4.38 MIL/uL — ABNORMAL LOW (ref 4.40–5.90)
RDW: 15.6 % — ABNORMAL HIGH (ref 11.5–14.5)
WBC: 14 10*3/uL — AB (ref 3.8–10.6)

## 2016-10-16 LAB — TROPONIN I: Troponin I: 6.07 ng/mL (ref <0.03)

## 2016-10-16 SURGERY — LEFT HEART CATH AND CORONARY ANGIOGRAPHY
Anesthesia: Moderate Sedation

## 2016-10-16 MED ORDER — IOPAMIDOL (ISOVUE-370) INJECTION 76%
INTRAVENOUS | Status: DC | PRN
  Administered 2016-10-16: 165 mL via INTRA_ARTERIAL

## 2016-10-16 MED ORDER — GABAPENTIN 300 MG PO CAPS
300.0000 mg | ORAL_CAPSULE | Freq: Two times a day (BID) | ORAL | Status: DC
Start: 2016-10-16 — End: 2016-10-19
  Administered 2016-10-16 – 2016-10-19 (×6): 300 mg via ORAL
  Filled 2016-10-16 (×6): qty 1

## 2016-10-16 MED ORDER — CLOPIDOGREL BISULFATE 75 MG PO TABS: 75.0000 mg | ORAL_TABLET | Freq: Every day | ORAL | Status: DC

## 2016-10-16 MED ORDER — ADENOSINE (DIAGNOSTIC) FOR INTRACORONARY USE
INTRAVENOUS | Status: DC | PRN
  Administered 2016-10-16: 48 ug via INTRACORONARY

## 2016-10-16 MED ORDER — DOPAMINE-DEXTROSE 3.2-5 MG/ML-% IV SOLN
INTRAVENOUS | Status: DC | PRN
  Administered 2016-10-16: 7.468 ug/kg/min via INTRAVENOUS

## 2016-10-16 MED ORDER — MIDAZOLAM HCL 2 MG/2ML IJ SOLN
INTRAMUSCULAR | Status: DC | PRN
  Administered 2016-10-16 (×2): 0.5 mg via INTRAVENOUS

## 2016-10-16 MED ORDER — TIROFIBAN HCL IV 12.5 MG/250 ML
INTRAVENOUS | Status: DC | PRN
  Administered 2016-10-16: 0.075 ug/kg/min via INTRAVENOUS

## 2016-10-16 MED ORDER — DOXYCYCLINE HYCLATE 100 MG PO TABS
100.0000 mg | ORAL_TABLET | Freq: Two times a day (BID) | ORAL | Status: DC
Start: 2016-10-16 — End: 2016-10-19
  Administered 2016-10-16 – 2016-10-19 (×6): 100 mg via ORAL
  Filled 2016-10-16 (×6): qty 1

## 2016-10-16 MED ORDER — HEPARIN SODIUM (PORCINE) 1000 UNIT/ML IJ SOLN
INTRAMUSCULAR | Status: AC
  Filled 2016-10-16: qty 1

## 2016-10-16 MED ORDER — ACETAMINOPHEN 325 MG PO TABS: 650.0000 mg | ORAL_TABLET | ORAL | Status: DC | PRN

## 2016-10-16 MED ORDER — HEPARIN (PORCINE) IN NACL 2-0.9 UNIT/ML-% IJ SOLN
INTRAMUSCULAR | Status: AC
  Filled 2016-10-16: qty 1000

## 2016-10-16 MED ORDER — TIROFIBAN HCL IV 12.5 MG/250 ML: 0.0750 ug/kg/min | INTRAVENOUS | Status: AC

## 2016-10-16 MED ORDER — SODIUM CHLORIDE 0.9 % IV SOLN: INTRAVENOUS | Status: AC

## 2016-10-16 MED ORDER — CLOPIDOGREL BISULFATE 75 MG PO TABS: 600.0000 mg | ORAL_TABLET | Freq: Once | ORAL | Status: DC

## 2016-10-16 MED ORDER — HEPARIN SODIUM (PORCINE) 1000 UNIT/ML IJ SOLN
INTRAMUSCULAR | Status: DC | PRN
  Administered 2016-10-16: 5000 [IU] via INTRAVENOUS
  Administered 2016-10-16: 3000 [IU] via INTRAVENOUS

## 2016-10-16 MED ORDER — ADENOSINE 6 MG/2ML IV SOLN
INTRAVENOUS | Status: AC
  Filled 2016-10-16: qty 2

## 2016-10-16 MED ORDER — DOPAMINE-DEXTROSE 3.2-5 MG/ML-% IV SOLN
7.5000 ug/kg/min | INTRAVENOUS | Status: DC
Start: 2016-10-16 — End: 2016-10-17

## 2016-10-16 MED ORDER — SERTRALINE HCL 100 MG PO TABS
200.0000 mg | ORAL_TABLET | Freq: Every day | ORAL | Status: DC
  Administered 2016-10-17 – 2016-10-19 (×3): 200 mg via ORAL
  Filled 2016-10-16 (×3): qty 2

## 2016-10-16 MED ORDER — MIDAZOLAM HCL 2 MG/2ML IJ SOLN
INTRAMUSCULAR | Status: AC
  Filled 2016-10-16: qty 2

## 2016-10-16 MED ORDER — NITROGLYCERIN 5 MG/ML IV SOLN
INTRAVENOUS | Status: AC
  Filled 2016-10-16: qty 10

## 2016-10-16 MED ORDER — CLOPIDOGREL BISULFATE 75 MG PO TABS
600.0000 mg | ORAL_TABLET | Freq: Once | ORAL | Status: AC
  Administered 2016-10-16: 600 mg via ORAL
  Filled 2016-10-16: qty 8

## 2016-10-16 MED ORDER — ASPIRIN 81 MG PO CHEW
81.0000 mg | CHEWABLE_TABLET | Freq: Every day | ORAL | Status: DC
  Administered 2016-10-17 – 2016-10-19 (×3): 81 mg via ORAL
  Filled 2016-10-16 (×3): qty 1

## 2016-10-16 MED ORDER — SODIUM CHLORIDE 0.9% FLUSH
3.0000 mL | Freq: Two times a day (BID) | INTRAVENOUS | Status: DC
  Administered 2016-10-16 – 2016-10-19 (×6): 3 mL via INTRAVENOUS

## 2016-10-16 MED ORDER — TIROFIBAN HCL IV 12.5 MG/250 ML: 0.0750 ug/kg/min | INTRAVENOUS | Status: DC

## 2016-10-16 MED ORDER — SODIUM CHLORIDE 0.9 % IV SOLN: 250.0000 mL | INTRAVENOUS | Status: DC | PRN

## 2016-10-16 MED ORDER — ONDANSETRON HCL 4 MG/2ML IJ SOLN: 4.0000 mg | Freq: Four times a day (QID) | INTRAMUSCULAR | Status: DC | PRN

## 2016-10-16 MED ORDER — SODIUM CHLORIDE 0.9% FLUSH: 3.0000 mL | INTRAVENOUS | Status: DC | PRN

## 2016-10-16 MED ORDER — CARBAMIDE PEROXIDE 6.5 % OT SOLN
10.0000 [drp] | Freq: Every day | OTIC | Status: DC
  Filled 2016-10-16: qty 15

## 2016-10-16 MED ORDER — HEPARIN SODIUM (PORCINE) 5000 UNIT/ML IJ SOLN
5000.0000 [IU] | Freq: Three times a day (TID) | INTRAMUSCULAR | Status: DC
  Administered 2016-10-17 – 2016-10-19 (×7): 5000 [IU] via SUBCUTANEOUS
  Filled 2016-10-16 (×7): qty 1

## 2016-10-16 MED ORDER — ATORVASTATIN CALCIUM 20 MG PO TABS
80.0000 mg | ORAL_TABLET | Freq: Every day | ORAL | Status: DC
  Administered 2016-10-17 – 2016-10-18 (×2): 80 mg via ORAL
  Filled 2016-10-16 (×2): qty 4

## 2016-10-16 MED ORDER — FENTANYL CITRATE (PF) 100 MCG/2ML IJ SOLN
INTRAMUSCULAR | Status: DC | PRN
  Administered 2016-10-16 (×3): 25 ug via INTRAVENOUS

## 2016-10-16 MED ORDER — CLOPIDOGREL BISULFATE 75 MG PO TABS
75.0000 mg | ORAL_TABLET | Freq: Every day | ORAL | Status: DC
  Administered 2016-10-17 – 2016-10-19 (×3): 75 mg via ORAL
  Filled 2016-10-16 (×3): qty 1

## 2016-10-16 MED ORDER — FENTANYL CITRATE (PF) 100 MCG/2ML IJ SOLN
INTRAMUSCULAR | Status: AC
  Filled 2016-10-16: qty 2

## 2016-10-16 MED ORDER — TIROFIBAN (AGGRASTAT) BOLUS VIA INFUSION
INTRAVENOUS | Status: DC | PRN
  Administered 2016-10-16: 2142.5 ug via INTRAVENOUS

## 2016-10-16 SURGICAL SUPPLY — 22 items
BALLN TREK RX 2.5X12 (BALLOONS) ×3
BALLN TREK RX 3.0X15 (BALLOONS) ×3
BALLN ~~LOC~~ TREK RX 4.0X12 (BALLOONS) ×3
BALLOON TREK RX 2.5X12 (BALLOONS) ×1 IMPLANT
BALLOON TREK RX 3.0X15 (BALLOONS) ×1 IMPLANT
BALLOON ~~LOC~~ TREK RX 4.0X12 (BALLOONS) ×1 IMPLANT
CABLE ADAPT CONN TEMP 6FT (ADAPTER) ×3 IMPLANT
CANNULA 5F STIFF (CANNULA) ×9 IMPLANT
CATH INFINITI 5FR ANG PIGTAIL (CATHETERS) ×3 IMPLANT
CATH INFINITI 5FR JL4 (CATHETERS) ×6 IMPLANT
CATH VISTA GUIDE 6FR JR4 (CATHETERS) ×3 IMPLANT
DEVICE CLOSURE MYNXGRIP 6/7F (Vascular Products) ×3 IMPLANT
DEVICE INFLAT 30 PLUS (MISCELLANEOUS) ×3 IMPLANT
KIT MANI 3VAL PERCEP (MISCELLANEOUS) ×3 IMPLANT
PACK CARDIAC CATH (CUSTOM PROCEDURE TRAY) ×3 IMPLANT
SHEATH AVANTI 5FR X 11CM (SHEATH) ×3 IMPLANT
SHEATH AVANTI 6FR X 11CM (SHEATH) ×3 IMPLANT
SLEEVE REPOSITIONING LENGTH 30 (MISCELLANEOUS) ×3 IMPLANT
STENT XIENCE ALPINE RX 3.5X23 (Permanent Stent) ×3 IMPLANT
WIRE EMERALD 3MM-J .035X150CM (WIRE) ×3 IMPLANT
WIRE PACING TEMP ST TIP 5 (CATHETERS) ×3 IMPLANT
WIRE RUNTHROUGH .014X180CM (WIRE) ×3 IMPLANT

## 2016-10-16 NOTE — ED Provider Notes (Signed)
[facility] Emergency Department Provider Note  ____________________________________________  Time seen: Approximately 4:09 PM  I have reviewed the triage vital signs and the nursing notes.   HISTORY  Chief Complaint Chest Pain Level 5 caveat:  Portions of the history and physical were unable to be obtained due to the patient's acute illness    HPI Ryan EVRARD is a 75 y.o. male who complains of central chest pain that started 2 hours ago, constant. He tried taking 2 Vicodin which did not help. Continue feeling worse and worse and became diaphoretic. EMS reported an initial heart rate of 40, blood pressure of 40 systolic. They initiated dopamine en route but were unable to follow serial pressures. Patient reports nausea     Past Medical History:  Diagnosis Date  . Alcohol abuse   . Anxiety   . Arthritis   . 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 Cancer ([place])    multiple myeloma  . Depression   . Dysrhythmia   . Fall    3 weeks ago/ torn ligaments left knee/wearing brace/pt  . Hypertension   . Neuropathy ([place])   . Pain    chronic leg and ankle pain  . RLS (restless legs syndrome)      There are no active problems to display for this patient.    Past Surgical History:  Procedure Laterality Date  . BRAIN SURGERY     subdural hematoma  . CATARACT EXTRACTION W/PHACO Right 08/16/2016   Procedure: CATARACT EXTRACTION PHACO AND INTRAOCULAR LENS PLACEMENT (IOC);  Surgeon: Eulogio Bear, MD;  Location: [facility] ORS;  Service: Ophthalmology;  Laterality: Right;  Lot # [id number] H [place]: 01:00.8 AP%:16.5 CDE: 10.02   . CATARACT EXTRACTION W/PHACO Left 10/04/2016   Procedure: CATARACT EXTRACTION PHACO AND INTRAOCULAR LENS PLACEMENT (IOC);  Surgeon: Eulogio Bear, MD;  Location: [facility] ORS;  Service: Ophthalmology;  Laterality: Left;  [place] 35.4 AP% 10.5 CDE 4.11 Fluid pack lot # [id number]  . EYE SURGERY    . FRACTURE SURGERY     multiple right ankle  .  HERNIA REPAIR    . JOINT REPLACEMENT Bilateral    thr  . KNEE ARTHROSCOPY W/ ACL RECONSTRUCTION     left x 2  . rcr       Prior to Admission medications   Medication Sig Start Date End Date Taking? Authorizing Provider  AMLODIPINE BESYLATE PO Take 10 mg by mouth daily.    Historical Provider, MD  carbamide peroxide (DEBROX) 6.5 % otic solution Place 10 drops into both ears daily.    Historical Provider, MD  carvedilol (COREG) 12.5 MG tablet Take 12.5 mg by mouth 2 (two) times daily with a meal.    Historical Provider, MD  doxycycline (MONODOX) 100 MG capsule Take 100 mg by mouth 2 (two) times daily.    Historical Provider, MD  gabapentin (NEURONTIN) 300 MG capsule Take 300 mg by mouth 2 (two) times daily. 1 am 3 hs    Historical Provider, MD  HYDROcodone-acetaminophen (NORCO) 10-325 MG tablet Take 1 tablet by mouth.    Historical Provider, MD  HYDROcodone-acetaminophen (NORCO/VICODIN) 5-325 MG tablet Take 1 tablet by mouth every 4 (four) hours as needed for moderate pain.    Historical Provider, MD  sertraline (ZOLOFT) 100 MG tablet Take 200 mg by mouth daily.    Historical Provider, MD  sildenafil (VIAGRA) 100 MG tablet Take 100 mg by mouth daily as needed for erectile dysfunction.    Historical Provider, MD  Testosterone Cypionate 200 MG/ML KIT Inject 1 mL into the muscle every 21 ( twenty-one) days.    Historical Provider, MD     Allergies Morphine and related; Neomycin; Other; and Penicillins   History reviewed. No pertinent family history.  Social History Social History  Substance Use Topics  . Smoking status: Current Every Day Smoker    Packs/day: 0.25    Types: Cigarettes  . Smokeless tobacco: Never Used  . Alcohol use Yes    Review of Systems Unable to obtain due to critical illness ____________________________________________   PHYSICAL EXAM:  VITAL SIGNS: ED Triage Vitals  Enc Vitals Group     BP --      Pulse Rate 10/16/16 1601 (!) 46     Resp 10/16/16  1603 15     Temp --      Temp src --      SpO2 10/16/16 1608 91 %     Weight 10/16/16 1602 189 lb (85.7 kg)     Height --      Head Circumference --      Peak Flow --      Pain Score --      Pain Loc --      Pain Edu? --      Excl. in [place]? --     Vital signs reviewed, nursing assessments reviewed.   Constitutional:   Alert and oriented. Ill-appearing ENT   Head:   Normocephalic and atraumatic.   Nose:   No congestion/rhinnorhea.     Neck:   No stridor. No SubQ emphysema. No meningismus. Hematological/Lymphatic/Immunilogical:   No cervical lymphadenopathy. Cardiovascular:   Bradycardia heart rate 40. Thready distal pulses, symmetric femoral pulses and carotid pulses.   Respiratory:   Normal respiratory effort without tachypnea nor retractions. Breath sounds are clear and equal bilaterally. No wheezes/rales/rhonchi. Gastrointestinal:   Soft and nontender. Non distended. There is no CVA tenderness.  No rebound, rigidity, or guarding. Musculoskeletal:   Nontender with normal range of motion in all extremities. No joint effusions.  No lower extremity tenderness.  No edema. Neurologic:   Normal speech and language.  CN 2-10 normal. Motor grossly intact. No gross focal neurologic deficits are appreciated.  Skin:    Skin is warm, diaphoretic and intact. No rash noted.  No petechiae, purpura, or bullae.  ____________________________________________    LABS (pertinent positives/negatives) (all labs ordered are listed, but only abnormal results are displayed) Labs Reviewed - No data to display ____________________________________________   EKG  Interpreted by me Bradycardia heart rate 39, no clear P waves, suspect junctional escape rhythm. ST elevation to 3 aVF with biphasic T waves in V4 V5 and V6. ST depression in aVL  ____________________________________________    RADIOLOGY    ____________________________________________   PROCEDURES Procedures CRITICAL  CARE Performed by: Joni Fears, Emanuell Morina   Total critical care time: 10 minutes  Critical care time was exclusive of separately billable procedures and treating other patients.  Critical care was necessary to treat or prevent imminent or life-threatening deterioration.  Critical care was time spent personally by me on the following activities: development of treatment plan with patient and/or surrogate as well as nursing, discussions with consultants, evaluation of patient's response to treatment, examination of patient, obtaining history from patient or surrogate, ordering and performing treatments and interventions, ordering and review of laboratory studies, ordering and review of radiographic studies, pulse oximetry and re-evaluation of patient's condition.  ____________________________________________   INITIAL IMPRESSION / ASSESSMENT AND PLAN / ED COURSE  Pertinent labs & imaging results that were available during my care of the patient were reviewed by me and considered in my medical decision making (see chart for details).  Patient presents with chest pain and EKG diagnostic of inferior STEMI. Cardiology doctor and present at bedside prior to patient arrival and throughout ED assessment. Patient does report a DO NOT RESUSCITATE directive that he has on file at the [place], and in discussion of this critical illness at high risk of mortality without intervention, he agrees to suspend the order during cardiac catheter procedure to allow for intervention.  During initial assessment and placing an ID band, I titrated dopamine based on response of pulses as best as possible to 10 to improve blood pressure and perfusion. Patient taken emergently to Cath Lab at 4:08 PM.     Clinical Course    ____________________________________________   FINAL CLINICAL IMPRESSION(S) / ED DIAGNOSES  Final diagnoses:  STEMI involving right coronary artery [facility])      New Prescriptions   No medications  on file     Portions of this note were generated with dragon dictation software. Dictation errors may occur despite best attempts at proofreading.    Carrie Mew, MD 10/16/16 [phone]

## 2016-10-16 NOTE — Brief Op Note (Signed)
Brief Cardiac Catheterization Note (Full Report to Follow)  Date: 10/16/2016 Time: 5:34 PM  PATIENT:  Ryan Travis  75 y.o. male  PRE-OPERATIVE DIAGNOSIS:  STEMI  POST-OPERATIVE DIAGNOSIS:  same  PROCEDURE:  Procedure(s): Left Heart Cath and Coronary Angiography (N/A) Coronary Stent Intervention (N/A)  SURGEON:  Surgeon(s) and Role:    * Nelva Bush, MD - Primary  FINDINGS: 1.  Thrombotic occlusion of proximal RCA. 2.  Moderate proximal/mid LAD disease. 3.  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  RECOMMENDATIONS: 1.  STAT CT head due to headache during and after procedure in the setting of remote subdural hematoma. 2.  If CT head without evidence of bleed, load with clopidogrel 600 mg followed by 75 mg daily. 3.  D/C tirofiban 4 hours after clopidogrel given. 4.  Transthoracic echocardiogram tonight or tomorrow. 5.  Transfer to CICU for post-STEMI care.  Nelva Bush, MD [facility] Pager: [phone]

## 2016-10-16 NOTE — H&P (Signed)
PULMONARY / CRITICAL CARE MEDICINE   Name: Ryan Travis MRN: [id number] DOB: [birth year]    ADMISSION DATE:  10/16/2016 CONSULTATION DATE:  10/16/16  REFERRING MD:  Dr.Christopher End  CHIEF COMPLAINT: STEMI, S/P left heart Cath and stent placement.  HISTORY OF PRESENT ILLNESS:    Roxas Clymer is a 75 yo male with PMH significant for Alcohol Abuse, Cancer, Depression , Dysrhythmias and Hypertension.  On 12/12 Patient presented to ED with complaints of chest pain. Patient took vicodin for pain but was feeling worse and became diaphoretic.Marland Kitchen  EMS was called, EMS reported an initial heart rate of 40 and SBP in 40's.  EKG was concerning for ST elevation with Biphasic T waves in V4 V5 and V6 and ST depression in aVL.  Cardiology was consulted and patient was immediately taken into Cath Lab for left heart Cath  And stent placement.  Post operatively CCM team was consulted for Post STEMI care.  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  PAST SURGICAL HISTORY: He  has a past surgical history that includes Fracture surgery; Brain surgery; Knee arthroscopy w/ ACL reconstruction; Hernia repair; rcr; Joint replacement (Bilateral); Cataract extraction w/PHACO (Right, 08/16/2016); Eye surgery; and Cataract extraction w/PHACO (Left, 10/04/2016).  Allergies  Allergen Reactions  . Morphine And Related   . Neomycin   . Other     METHIOLATE  . Penicillins     No current facility-administered medications on file prior to encounter.    Current Outpatient Prescriptions on File Prior to Encounter  Medication Sig  . AMLODIPINE BESYLATE PO Take 10 mg by mouth daily.  . carbamide peroxide (DEBROX) 6.5 % otic solution Place 10 drops into both ears daily.  . carvedilol (COREG) 12.5 MG tablet Take 12.5 mg by  mouth 2 (two) times daily with a meal.  . doxycycline (MONODOX) 100 MG capsule Take 100 mg by mouth 2 (two) times daily.  Marland Kitchen gabapentin (NEURONTIN) 300 MG capsule Take 300 mg by mouth 2 (two) times daily. 1 am 3 hs  . HYDROcodone-acetaminophen (NORCO) 10-325 MG tablet Take 1 tablet by mouth.  Marland Kitchen HYDROcodone-acetaminophen (NORCO/VICODIN) 5-325 MG tablet Take 1 tablet by mouth every 4 (four) hours as needed for moderate pain.  Marland Kitchen sertraline (ZOLOFT) 100 MG tablet Take 200 mg by mouth daily.  . sildenafil (VIAGRA) 100 MG tablet Take 100 mg by mouth daily as needed for erectile dysfunction.  . Testosterone Cypionate 200 MG/ML KIT Inject 1 mL into the muscle every 21 ( twenty-one) days.    FAMILY HISTORY:  His has no family status information on file.    SOCIAL HISTORY: He  reports that he has been smoking Cigarettes.  He has been smoking about 0.25 packs per day. He has never used smokeless tobacco. He reports that he drinks alcohol. He reports that he does not use drugs.  REVIEW OF SYSTEMS:   Review of Systems  Constitutional: Negative for chills, diaphoresis, fever, malaise/fatigue and weight loss.  HENT: Negative for ear discharge, nosebleeds and tinnitus.   Eyes: Negative for double vision, photophobia and pain.  Respiratory: Negative for hemoptysis, sputum production and shortness of breath.   Cardiovascular: Negative for palpitations, orthopnea, claudication and leg swelling.  Gastrointestinal: Negative for abdominal pain, blood in stool, constipation, diarrhea and vomiting.  Genitourinary: Negative for frequency and urgency.  Musculoskeletal: Negative for  falls, joint pain and neck pain.  Neurological: Negative for tremors, sensory change, speech change, focal weakness and weakness.  Endo/Heme/Allergies: Negative for environmental allergies.  Psychiatric/Behavioral: Negative for substance abuse. The patient is not nervous/anxious.      SUBJECTIVE:  Patient denies any Chest pain at  this time and states that he feels better.  No hematoma/swelling  noted on the  Right groin. VITAL SIGNS: Pulse 68   Resp 10   Ht _0  (1.88 m)   Wt 85.7 kg (188 lb 15 oz)   SpO2 98%   BMI 24.26 kg/m   HEMODYNAMICS:    VENTILATOR SETTINGS:    INTAKE / OUTPUT: I/O last 3 completed shifts: In: -  Out: 175 [Urine:175]  PHYSICAL EXAMINATION: General:  Elderly male, found on the bed , in no acute distress Neuro: Awake, alert, oriented, follows command, no focal deficits. HEENT: Atraumatic, normocephalic, no discharge, no JVD appreciated Cardiovascular: Irregularly irregular, No mrg NOTED  Lungs:  Clear bilaterally, no wheezes, crackles, rhonchi noted Abdomen:  Soft, nontender, active bowel sounds Musculoskeletal: no inflammation/deformity noted Skin:  Grossly Intact  LABS:  BMET No results for input(s): NA, K, CL, CO2, BUN, CREATININE, GLUCOSE in the last 168 hours.  Electrolytes No results for input(s): CALCIUM, MG, PHOS in the last 168 hours.  CBC  Recent Labs Lab 10/16/16 1848  WBC 14.0*  HGB 15.5  HCT 46.0  PLT 183    Coag's No results for input(s): APTT, INR in the last 168 hours.  Sepsis Markers No results for input(s): LATICACIDVEN, PROCALCITON, O2SATVEN in the last 168 hours.  ABG No results for input(s): PHART, PCO2ART, PO2ART in the last 168 hours.  Liver Enzymes No results for input(s): AST, ALT, ALKPHOS, BILITOT, ALBUMIN in the last 168 hours.  Cardiac Enzymes No results for input(s): TROPONINI, PROBNP in the last 168 hours.  Glucose No results for input(s): GLUCAP in the last 168 hours.  Imaging Ct Head Wo Contrast  Result Date: 10/16/2016 CLINICAL DATA:  75 year old male with sudden onset of headache lung the cath lab. Diaphoresis. EXAM: CT HEAD WITHOUT CONTRAST TECHNIQUE: Contiguous axial images were obtained from the base of the skull through the vertex without intravenous contrast. COMPARISON:  None. FINDINGS: Brain: Mild cerebral  atrophy. Wedge-shaped area of low attenuation in the posteromedial aspect of the right cerebellar hemisphere, related to an old cerebellar infarction. No evidence of acute infarction, hemorrhage, hydrocephalus, extra-axial collection or mass lesion/mass effect. Vascular: No hyperdense vessel or unexpected calcification. Skull: Normal. Negative for fracture or focal lesion. Sinuses/Orbits: No acute finding. Other: None. IMPRESSION: 1. No acute intracranial abnormalities. 2. Mild cerebral atrophy. 3. Old right cerebellar infarct. Electronically Signed   By: Vinnie Langton M.D.   On: 10/16/2016 18:21     STUDIES:  12/12 Cardiac Cath>> 1.Inferior STEMI with thrombotic occlusion of the proximal RCA. 1. 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 2. Upper normal left ventricular filling pressure (LVEDP 15 mmHg). 3. Successful PCI to the occluded proximal RCA with placement of a Xience Alpine 3.5 x 23 mm drug-eluting stent (postdilated with 4.0 mm [place] balloon) with 0% residual stenosis and TIMI-3 flow. 4. Successful placement of temporary transvenous pacing wire via the right femoral vein. Pacing wire was removed at end of the procedure, as the patient was no longer pacer dependent.  CT head 12/12>>No acute intracranial abnormalities  CULTURES: None  ANTIBIOTICS: none  SIGNIFICANT EVENTS: 12/12>> Patient admitted to  the ICU s/p Right heart Cath and stent placement to the right coronary artery  LINES/TUBES: none   ASSESSMENT / PLAN:  PULMONARY A: No active issues P:   Support with O2 to keep sats> 92%  CARDIOVASCULAR A:    MI S/P Left heart cath and Coronary Angiography  And stent to RCA. Dysrhythmias Hypotension  Hx ofHypertension P:  Continuous Telemetry CT head negative therefore loaded with clopidogrel per cardiology recommendation  D/C tirofiban 4 hours after clopidogrel given Dopamine gtt Transthoracic  echocardiogram tonight or tomorrow. Hold  Home dose coreg  And amlodipine  Rest per cardiology RENAL A:   Hyponatremia Hypokalemia P:   Replace electrolytes per ICU protocol  GASTROINTESTINAL A:   No active issues P:   HH DIET  HEMATOLOGIC A:   No active issues P:  Heparin for dvt prophylaxis  INFECTIOUS A:   leukocytocis related to stress response P:    Follow cbc Monitor fever curve  ENDOCRINE A:   No active issues P:   Blood glucose with BMP  NEUROLOGIC A:   Hx of Depression P:   Continue Sertaline Minimize sedating drugs  Bincy Varughese,AG-ACNP Pulmonary and [place]   10/16/2016, 7:37 PM   Attending note: STEMI s/p RCA stent. Weaned of dopamine shortly after procedure.  Rest per NP Varghese note above.   Appreciate hospitalist assistance.   Critical Care time - 30 mins  Thank you for consulting [facility], we will signoff at this time.  Please feel free to contact us with any questions at [phone] (please enter 7-digits).  Vilinda Boehringer, MD [place] [facility] Pager [phone] (please enter 7-digits) On Call Pager - [phone] (please enter 7-digits)

## 2016-10-16 NOTE — Progress Notes (Signed)
[facility] responded to code STEMI Pt in Rm03, [place] visited the Pt, the medical team was evaluating the Pt, the wife arrived, [facility] led the wife to the [place] waiting Rm, prayed for wife, and provided support and presence until the daughter arrived. [place] checked with the Nurses and updated the family on the Pt's progress timely, and later, the Pt was transferred to ICU Rm10.     10/16/16 2000  Clinical Encounter Type  Visited With Patient;Patient and family together  Visit Type Initial;Follow-up;Spiritual support;Code;ED;Trauma  Referral From Nurse  Consult/Referral To Chaplain  Spiritual Encounters  Spiritual Needs Prayer;Emotional;Other (Comment)

## 2016-10-16 NOTE — Consult Note (Addendum)
Addendum to previous note. Dr. Saunders Revel called and stated that he would like the plavix load to be given now and the aggrastat to be stopped around midnight (4hrs later). Does not want the full 18 hours of aggrastat. Orders have been changed. Called the RN to inform him of the changes.  Ramond Dial, Pharm.D, BCPS Clinical Pharmacist

## 2016-10-16 NOTE — Progress Notes (Signed)
Fem cath flushed and pressure bag applied per Dr Darnelle Bos orders.

## 2016-10-16 NOTE — ED Notes (Signed)
Wife arrived. Escorted to cathlab waiting with pt relations. All medications given to wife.

## 2016-10-16 NOTE — ED Notes (Signed)
Cardiology at bedside on pt arrival

## 2016-10-16 NOTE — ED Triage Notes (Addendum)
Ems called for CP that started 2 hours ago. Pt took 2 vicodin at time. Dopamine started by ems for bp 40s/20s; 13 gtts per min. Hr in 39s. Pt diaphoretic.

## 2016-10-16 NOTE — Consult Note (Addendum)
Cardiology Consultation Note    Patient ID: Ryan Travis, MRN: [id number], DOB/AGE: 03/09/1941 75 y.o. Admit date: 10/16/2016   Date of Consult: 10/16/2016 Primary Physician: PROVIDER NOT [place] Primary Cardiologist: None  Chief Complaint: Chest pain Reason for Consultation: STEMI Requesting MD: Carrie Mew, MD  HPI: Ryan Travis is a 75 y.o. male with history of hypertension, multiple myeloma, multiple falls in the past with subdural hematoma at least 5 years ago, depression, anxiety, alcohol abuse, and chronic pain, and we have been asked to see due to chest pain and EKG changes consistent with STEMI. The patient reports developing chest pain around 3 this afternoon while at home. His wife noted that [name] appeared ill and called 911. EMS found the patient to be hypotensive and bradycardic with a systolic blood pressure in the 40s. EKG showed bradycardia without P waves consistent with a junctional rhythm. Inferior ST segment elevations were also noted.  The patient was given aspirin 324 mg and started on a dopamine infusion. He was transported to the [facility] ED for evaluation. Here, the patient reports having continued chest and neck pain that began earlier this afternoon. He is also nauseated. He denies shortness of breath and lightheadedness. He has not had any recent bleeding. He notes that he fell in the shower yesterday but did not strike his head. He was transported for emergent cardiac catheterization and temporary pacing wire placement. Catheterization revealed occlusion of the proximal RCA, which was successfully treated with a single drug-eluting stent. Temporary transvenous pacemaker was removed at the [place] Hoog[place]orn of the case, as the patient was no longer pacer dependent. However, telemetry was suspicious for possible atrial fibrillation.  Past Medical History:  Diagnosis Date  . Alcohol abuse   . Anxiety   . Arthritis   . 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 Cancer ([place])      multiple myeloma  . Depression   . Dysrhythmia   . Fall    3 weeks ago/ torn ligaments left knee/wearing brace/pt  . Hypertension   . Neuropathy ([place])   . Pain    chronic leg and ankle pain  . RLS (restless legs syndrome)       Surgical History:  Past Surgical History:  Procedure Laterality Date  . BRAIN SURGERY     subdural hematoma  . CATARACT EXTRACTION W/PHACO Right 08/16/2016   Procedure: CATARACT EXTRACTION PHACO AND INTRAOCULAR LENS PLACEMENT (IOC);  Surgeon: Eulogio Bear, MD;  Location: [facility] ORS;  Service: Ophthalmology;  Laterality: Right;  Lot # [id number] H [place]: 01:00.8 AP%:16.5 CDE: 10.02   . CATARACT EXTRACTION W/PHACO Left 10/04/2016   Procedure: CATARACT EXTRACTION PHACO AND INTRAOCULAR LENS PLACEMENT (IOC);  Surgeon: Eulogio Bear, MD;  Location: [facility] ORS;  Service: Ophthalmology;  Laterality: Left;  [place] 35.4 AP% 10.5 CDE 4.11 Fluid pack lot # [id number]  . EYE SURGERY    . FRACTURE SURGERY     multiple right ankle  . HERNIA REPAIR    . JOINT REPLACEMENT Bilateral    thr  . KNEE ARTHROSCOPY W/ ACL RECONSTRUCTION     left x 2  . rcr       Home Meds: Prior to Admission medications   Medication Sig Start Date Jarielys Girardot Date Taking? Authorizing Provider  AMLODIPINE BESYLATE PO Take 10 mg by mouth daily.    Historical Provider, MD  carbamide peroxide (DEBROX) 6.5 % otic solution Place 10 drops into both ears daily.    Historical Provider, MD  carvedilol (COREG) 12.5 MG tablet Take 12.5 mg by mouth 2 (two) times daily with a meal.    Historical Provider, MD  doxycycline (MONODOX) 100 MG capsule Take 100 mg by mouth 2 (two) times daily.    Historical Provider, MD  gabapentin (NEURONTIN) 300 MG capsule Take 300 mg by mouth 2 (two) times daily. 1 am 3 hs    Historical Provider, MD  HYDROcodone-acetaminophen (NORCO) 10-325 MG tablet Take 1 tablet by mouth.    Historical Provider, MD  HYDROcodone-acetaminophen (NORCO/VICODIN) 5-325 MG tablet Take 1 tablet by mouth  every 4 (four) hours as needed for moderate pain.    Historical Provider, MD  sertraline (ZOLOFT) 100 MG tablet Take 200 mg by mouth daily.    Historical Provider, MD  sildenafil (VIAGRA) 100 MG tablet Take 100 mg by mouth daily as needed for erectile dysfunction.    Historical Provider, MD  Testosterone Cypionate 200 MG/ML KIT Inject 1 mL into the muscle every 21 ( twenty-one) days.    Historical Provider, MD    Inpatient Medications:  . aspirin  81 mg Oral Daily  . [START ON 10/17/2016] atorvastatin  80 mg Oral q1800  . carbamide peroxide  10 drop Both Ears Daily  . [START ON 10/17/2016] clopidogrel  600 mg Oral Once  . [START ON 10/18/2016] clopidogrel  75 mg Oral Daily  . doxycycline  100 mg Oral BID  . gabapentin  300 mg Oral BID  . [START ON 10/17/2016] heparin  5,000 Units Subcutaneous Q8H  . [START ON 10/17/2016] sertraline  200 mg Oral Daily  . sodium chloride flush  3 mL Intravenous Q12H   . sodium chloride    . DOPamine    . tirofiban 0.075 mcg/kg/min (10/16/16 1939)    Allergies:  Allergies  Allergen Reactions  . Morphine And Related   . Neomycin   . Other     METHIOLATE  . Penicillins     Social History   Social History  . Marital status: Married    Spouse name: N/A  . Number of children: N/A  . Years of education: N/A   Occupational History  . Not on file.   Social History Main Topics  . Smoking status: Current Every Day Smoker    Packs/day: 0.25    Types: Cigarettes  . Smokeless tobacco: Never Used  . Alcohol use Yes  . Drug use: No  . Sexual activity: No   Other Topics Concern  . Not on file   Social History Narrative  . No narrative on file     Family history: No history of premature coronary artery disease.   Review of Systems: A 12-system review of systems was performed and is negative except as noted in the HPI.  Labs:  Recent Labs  10/16/16 1848  TROPONINI 6.07*   Lab Results  Component Value Date   WBC 14.0 (H) 10/16/2016    HGB 15.5 10/16/2016   HCT 46.0 10/16/2016   MCV 105.0 (H) 10/16/2016   PLT 183 10/16/2016    Recent Labs Lab 10/16/16 1848  NA 134*  K 3.4*  CL 104  CO2 23  BUN 21*  CREATININE 0.92  CALCIUM 8.6*  PROT 7.3  BILITOT 0.7  ALKPHOS 49  ALT 28  AST 78*  GLUCOSE 110*   No results found for: CHOL, HDL, LDLCALC, TRIG No results found for: DDIMER  Radiology/Studies:  Ct Head Wo Contrast  Result Date: 10/16/2016 CLINICAL DATA:  75 year old male with sudden onset  of headache lung the cath lab. Diaphoresis. EXAM: CT HEAD WITHOUT CONTRAST TECHNIQUE: Contiguous axial images were obtained from the base of the skull through the vertex without intravenous contrast. COMPARISON:  None. FINDINGS: Brain: Mild cerebral atrophy. Wedge-shaped area of low attenuation in the posteromedial aspect of the right cerebellar hemisphere, related to an old cerebellar infarction. No evidence of acute infarction, hemorrhage, hydrocephalus, extra-axial collection or mass lesion/mass effect. Vascular: No hyperdense vessel or unexpected calcification. Skull: Normal. Negative for fracture or focal lesion. Sinuses/Orbits: No acute finding. Other: None. IMPRESSION: 1. No acute intracranial abnormalities. 2. Mild cerebral atrophy. 3. Old right cerebellar infarct. Electronically Signed   By: Vinnie Langton M.D.   On: 10/16/2016 18:21    Wt Readings from Last 3 Encounters:  10/16/16 188 lb 15 oz (85.7 kg)  10/02/16 190 lb (86.2 kg)  08/16/16 190 lb (86.2 kg)    EKG: Sinus arrest with junctional rhythm and inferior ST segment elevation.  Physical Exam: Pulse 68, resp. rate 10, height '6\' 2"'  (1.88 m), weight 188 lb 15 oz (85.7 kg), SpO2 98 %. Body mass index is 24.26 kg/m. General: Ill-appearing elderly man, lying in bed. Head: Normocephalic, atraumatic, sclera non-icteric, no xanthomas, nares are without discharge.  Neck: Negative for carotid bruits. Unable to assess JVP, as patient is lying flat. Lungs: Clear  anteriorly. Heart: Irregularly irregular. No murmurs or rubs. Abdomen: Soft, non-tender, non-distended with normoactive bowel sounds. No hepatomegaly. No rebound/guarding. No obvious abdominal masses. Msk:  Strength and tone appear normal for age. Extremities: No clubbing or cyanosis. No edema.  Pedal pulses are trace. Neuro: Alert and oriented X 3. No facial asymmetry. No focal deficit. Moves all extremities spontaneously. Psych:  Responds to questions appropriately with a normal affect.    Assessment and Plan  75 year old man with history of hypertension, multiple myeloma, subdural hematoma several years ago in the setting of frequent falls, anxiety, depression, and alcohol abuse, admitted with inferior STEMI, cardiac shock, and sinus arrest.  STEMI: The patient underwent successful PCI to the proximal RCA with restoration of TIMI-3 flow. His chest pain has resolved. He has continued to have intermittent neck and head discomfort, though this is improving. CT of the head was negative for acute abnormality, including bleed.  Continue low-dose aspirin indefinitely.  Administer clopidogrel 600 mg 1, followed by 75 mg daily to continue for 12 months if possible.  Discontinue tirofiban infusion 4 hours after administration of clopidogrel load.  Obtain transthoracic echocardiogram in the morning.  Initiation of carvedilol secondary to hypotension.  Administer atorvastatin 80 mg daily.  Trental troponins until they have peaked and stopped.  Obtain fasting lipid panel and hemoglobin A1c in the morning.  Cardiogenic shock: Patient's dopamine requirement has decreased with restoration of flow through the RCA. LVEDP was upper normal during cath. He may have some element of RV dysfunction leading to his hypotension.  Continue to wean dopamine as tolerated.  Continue IV fluid hydration for possible RV involvement of acute MI. If clinical assessment of volume status is difficult, placement of  a central venous catheter for CVP monitoring may be helpful.  Continue to infuse dopamine through the venous sheath in the right groin. Anticipate removal of this once vasopressors are no longer needed.  Atrial fibrillation: Telemetry post PCI suspicious for atrial fibrillation. Patient denies prior history of this.  Obtain EKG when patient arrives in ICU.  He appears adequately rate controlled at this time. Would not add additional agents.  Patient is a poor candidate  for anticoagulation, as he would require triple therapy for at least one month. In the setting of falls, alcohol use, and prior subdural hematoma, I feel the risks of chronic anticoagulation outweigh benefits should he remain in atrial fibrillation.  Check TSH with morning labs.  Headache: No focal neurologic deficits. CT head shows no acute abnormalities. I suspect this is likely due to his chronic pain syndrome.  Continue analgesic therapy, PCCM.  Verlan Friends Ivelise Castillo MD 10/16/2016, 7:59 PM Pager: [phone]

## 2016-10-16 NOTE — ED Notes (Signed)
Report to [facility] with cathlab

## 2016-10-16 NOTE — Progress Notes (Signed)
Called for admission s/p RCA occlusion with stent placement.   Now appears to be doing well, maintained on dopamine for low BP. Ct head results reviewed, no acute process, will order plavix as recommended by cardiology. Other admission orders placed, will monitor in ICU. Full H&P to follow.   Marda Stalker, M.D.  10/16/2016

## 2016-10-16 NOTE — ED Notes (Signed)
Transported to cath lab with RN, EMP, defib pads, cardiology, and o2

## 2016-10-16 NOTE — ED Notes (Signed)
C/o [facility]

## 2016-10-16 NOTE — ED Notes (Signed)
Unable to obtain bp in room. Cardiology aware and ok to go to cath lab without

## 2016-10-16 NOTE — ED Notes (Signed)
defib PADS placed on pt

## 2016-10-17 ENCOUNTER — Encounter: Payer: Self-pay | Admitting: Internal Medicine

## 2016-10-17 ENCOUNTER — Inpatient Hospital Stay (HOSPITAL_COMMUNITY)
Admit: 2016-10-17 | Discharge: 2016-10-17 | Disposition: A | Discharge status: Home / Self Care | Payer: Non-veteran care | Attending: Internal Medicine | Admitting: Internal Medicine

## 2016-10-17 DIAGNOSIS — I480 Paroxysmal atrial fibrillation: Secondary | ICD-10-CM

## 2016-10-17 DIAGNOSIS — I639 Cerebral infarction, unspecified: Secondary | ICD-10-CM

## 2016-10-17 LAB — CBC
HCT: 40.9 % (ref 40.0–52.0)
Hemoglobin: 14.2 g/dL (ref 13.0–18.0)
MCH: 35.7 pg — ABNORMAL HIGH (ref 26.0–34.0)
MCHC: 34.8 g/dL (ref 32.0–36.0)
MCV: 102.5 fL — ABNORMAL HIGH (ref 80.0–100.0)
Platelets: 171 10*3/uL (ref 150–440)
RBC: 3.99 MIL/uL — ABNORMAL LOW (ref 4.40–5.90)
RDW: 15.3 % — ABNORMAL HIGH (ref 11.5–14.5)
WBC: 11.2 10*3/uL — ABNORMAL HIGH (ref 3.8–10.6)

## 2016-10-17 LAB — LIPID PANEL
CHOL/HDL RATIO: 3.6 ratio
CHOLESTEROL: 123 mg/dL (ref 0–200)
HDL: 34 mg/dL — ABNORMAL LOW (ref >40)
LDL Cholesterol: 65 mg/dL (ref 0–99)
Triglycerides: 119 mg/dL (ref <150)
VLDL: 24 mg/dL (ref 0–40)

## 2016-10-17 LAB — TROPONIN I
Troponin I: 40.69 ng/mL (ref <0.03)
Troponin I: 36.23 ng/mL (ref <0.03)

## 2016-10-17 LAB — BASIC METABOLIC PANEL WITH GFR
Anion gap: 12 (ref 5–15)
BUN: 19 mg/dL (ref 6–20)
CO2: 19 mmol/L — ABNORMAL LOW (ref 22–32)
Calcium: 8.6 mg/dL — ABNORMAL LOW (ref 8.9–10.3)
Chloride: 107 mmol/L (ref 101–111)
Creatinine, Ser: 0.87 mg/dL (ref 0.61–1.24)
GFR calc Af Amer: >60 mL/min
GFR calc non Af Amer: >60 mL/min
Glucose, Bld: 77 mg/dL (ref 65–99)
Potassium: 3.3 mmol/L — ABNORMAL LOW (ref 3.5–5.1)
Sodium: 138 mmol/L (ref 135–145)

## 2016-10-17 LAB — ECHOCARDIOGRAM COMPLETE
HEIGHTINCHES: 74 in
Weight: 3022.95 oz

## 2016-10-17 LAB — TSH: TSH: 0.853 u[IU]/mL (ref 0.350–4.500)

## 2016-10-17 LAB — MAGNESIUM: Magnesium: 1.5 mg/dL — ABNORMAL LOW (ref 1.7–2.4)

## 2016-10-17 MED ORDER — PNEUMOCOCCAL VAC POLYVALENT 25 MCG/0.5ML IJ INJ: 0.5000 mL | INJECTION | INTRAMUSCULAR | Status: DC | PRN

## 2016-10-17 MED ORDER — CARVEDILOL 12.5 MG PO TABS
12.5000 mg | ORAL_TABLET | Freq: Two times a day (BID) | ORAL | Status: DC
  Filled 2016-10-17 (×2): qty 1

## 2016-10-17 MED ORDER — CARVEDILOL 6.25 MG PO TABS
6.2500 mg | ORAL_TABLET | Freq: Two times a day (BID) | ORAL | Status: DC
  Administered 2016-10-17 – 2016-10-19 (×4): 6.25 mg via ORAL
  Filled 2016-10-17 (×4): qty 1

## 2016-10-17 MED ORDER — ONDANSETRON HCL 4 MG/2ML IJ SOLN: 4.0000 mg | Freq: Once | INTRAMUSCULAR | Status: DC | PRN

## 2016-10-17 MED ORDER — FENTANYL CITRATE (PF) 100 MCG/2ML IJ SOLN: 25.0000 ug | INTRAMUSCULAR | Status: DC | PRN

## 2016-10-17 MED ORDER — HYDROCODONE-ACETAMINOPHEN 5-325 MG PO TABS: 1.0000 | ORAL_TABLET | ORAL | Status: DC | PRN

## 2016-10-17 NOTE — Care Management (Addendum)
I have notified Norina Buzzard with [facility] of patient's admission. Message left for patient's wife that [place] forms will be left in patient's room for her to make a decision regarding transfer when patient is stable. I met with patient and explained [facility] forms. He wants to look over them with his wife before deciding. This RNCM contact number was given to patient to call me today to pick up forms. Anticipate transfer to floor today and then home within next few days.

## 2016-10-17 NOTE — Progress Notes (Signed)
*  PRELIMINARY RESULTS* Echocardiogram 2D Echocardiogram has been performed.  Ryan Travis 10/17/2016, 3:15 PM

## 2016-10-17 NOTE — Progress Notes (Addendum)
[place] at [place] NAME: Ryan Travis    MR#:  [id number]  DATE OF BIRTH:  1940-12-19  SUBJECTIVE:   Denies any chest pain. he is off dopamine drip. REVIEW OF SYSTEMS:   Review of Systems  Constitutional: Negative for chills, fever and weight loss.  HENT: Negative for ear discharge, ear pain and nosebleeds.   Eyes: Negative for blurred vision, pain and discharge.  Respiratory: Negative for sputum production, shortness of breath, wheezing and stridor.   Cardiovascular: Negative for chest pain, palpitations, orthopnea and PND.  Gastrointestinal: Negative for abdominal pain, diarrhea, nausea and vomiting.  Genitourinary: Negative for frequency and urgency.  Musculoskeletal: Negative for back pain and joint pain.  Neurological: Negative for sensory change, speech change, focal weakness and weakness.  Psychiatric/Behavioral: Negative for depression and hallucinations. The patient is not nervous/anxious.    Tolerating Diet:yes Tolerating PT: Pending  DRUG ALLERGIES:   Allergies  Allergen Reactions  . Morphine And Related   . Neomycin   . Other     METHIOLATE  . Penicillins     VITALS:  Blood pressure 117/86, pulse 82, temperature 98.7 F (37.1 C), temperature source Oral, resp. rate (!) 22, height '6\' 2"'  (1.88 m), weight 85.7 kg (188 lb 15 oz), SpO2 99 %.  PHYSICAL EXAMINATION:   Physical Exam  GENERAL:  75 y.o.-year-old patient lying in the bed with no acute distress.  EYES: Pupils equal, round, reactive to light and accommodation. No scleral icterus. Extraocular muscles intact.  HEENT: Head atraumatic, normocephalic. Oropharynx and nasopharynx clear.  NECK:  Supple, no jugular venous distention. No thyroid enlargement, no tenderness.  LUNGS: Normal breath sounds bilaterally, no wheezing, rales, rhonchi. No use of accessory muscles of respiration.  CARDIOVASCULAR: S1, S2 normal. No murmurs, rubs, or gallops.  ABDOMEN:  Soft, nontender, nondistended. Bowel sounds present. No organomegaly or mass.  EXTREMITIES: No cyanosis, clubbing or edema b/l.    NEUROLOGIC: Cranial nerves II through XII are intact. No focal Motor or sensory deficits b/l.   PSYCHIATRIC:  patient is alert and oriented x 3.  SKIN: No obvious rash, lesion, or ulcer.   LABORATORY PANEL:  CBC  Recent Labs Lab 10/17/16 0727  WBC 11.2*  HGB 14.2  HCT 40.9  PLT 171    Chemistries   Recent Labs Lab 10/16/16 1848 10/17/16 0044  NA 134*  --   K 3.4*  --   CL 104  --   CO2 23  --   GLUCOSE 110*  --   BUN 21*  --   CREATININE 0.92  --   CALCIUM 8.6*  --   MG  --  1.5*  AST 78*  --   ALT 28  --   ALKPHOS 49  --   BILITOT 0.7  --    Cardiac Enzymes  Recent Labs Lab 10/17/16 0727  TROPONINI 36.23*   RADIOLOGY:  Ct Head Wo Contrast  Result Date: 10/16/2016 CLINICAL DATA:  75 year old male with sudden onset of headache lung the cath lab. Diaphoresis. EXAM: CT HEAD WITHOUT CONTRAST TECHNIQUE: Contiguous axial images were obtained from the base of the skull through the vertex without intravenous contrast. COMPARISON:  None. FINDINGS: Brain: Mild cerebral atrophy. Wedge-shaped area of low attenuation in the posteromedial aspect of the right cerebellar hemisphere, related to an old cerebellar infarction. No evidence of acute infarction, hemorrhage, hydrocephalus, extra-axial collection or mass lesion/mass effect. Vascular: No hyperdense vessel or unexpected calcification. Skull: Normal. Negative for  fracture or focal lesion. Sinuses/Orbits: No acute finding. Other: None. IMPRESSION: 1. No acute intracranial abnormalities. 2. Mild cerebral atrophy. 3. Old right cerebellar infarct. Electronically Signed   By: Vinnie Langton M.D.   On: 10/16/2016 18:21   ASSESSMENT AND PLAN:   Ryan Travis is a 75 y.o. male with history of hypertension, multiple myeloma, multiple falls in the past with subdural hematoma at least 5 years ago,  depression, anxiety, alcohol abuse, and chronic pain, and we have been asked to see due to chest pain and EKG changes consistent with STEMI  1.  Acute Inferior St elevation MI: -Status post cardiac cath with successful PCI to the proximal RCA with restoration of TIMI-3 flow -No further chest pain -Continue Dual therapy with ASA 81 mg and Plavix 75 mg daily -Continue Coreg as BP allows, may need to decrease dose to 6.25 mg bid -Continue Lipitor 80 mg daily, needs FLP as an outpatient -A1C pending --Echo is pending to evaluate LV systolic function and wall motion  2. Cardiogenic shock post MI -Dopamine has been discontinued at 4:30 AM on 12/13 with good BP since -Stable  3. New onset Afib: -Controlled ventricular response -Coreg as above -He is a poor candidate for anticoagulation, as he would require triple therapy for at least one month. In the setting of falls, alcohol use, and prior subdural hematoma, it has been felt the risks of chronic anticoagulation outweigh benefits should he remain in atrial fibrillation, CT head negative -TSH pending -Replete potassium as needed-Check magnesium   4.depression/anxiety cont zoloft  Transfer to 2 A later today if remains stable   Case discussed with Care Management/Social Worker. Management plans discussed with the patient, family and they are in agreement.  CODE STATUS: full  DVT Prophylaxis:lovenox TOTAL TIME TAKING CARE OF THIS PATIENT: 30 minutes.  >50% time spent on counselling and coordination of care  POSSIBLE D/C IN 1-2 DAYS, DEPENDING ON CLINICAL CONDITION.  Note: This dictation was prepared with Dragon dictation along with smaller phrase technology. Any transcriptional errors that result from this process are unintentional.  Nubia Ziesmer M.D on 10/17/2016 at 12:34 PM  Between 7am to 6pm - Pager - [phone]  After 6pm go to [email or web address] - password [email or web address] [place] [facility]  Office   [phone]  CC: Primary care physician; PROVIDER NOT IN SYSTEM

## 2016-10-17 NOTE — Care Management (Signed)
Consent to transfer to [facility] completed and faxed. Margarita Grizzle with [facility] has been updated.

## 2016-10-17 NOTE — Progress Notes (Signed)
Patient Name: Ryan Travis Date of Encounter: 10/17/2016  Primary Cardiologist: End  Hospital Problem List     Principal Problem:   STEMI involving right coronary artery [facility]) Active Problems:   STEMI (ST elevation myocardial infarction) ([place])   Myocardial infarct     Subjective   Status post cardiac cath on 12/12 that showed an occluded proximal RCA s/p PCI/DES with TIMI 3 flow. Also developed Afib post procedure, currently in Afib with controlled ventricular rates in the 80's bpm. No further chest pain. Dopamine infusion has been discontinued with SBP in the 1-teens at 4:30. Troponin currently > 40. No headache.   Inpatient Medications    Scheduled Meds: . aspirin  81 mg Oral Daily  . atorvastatin  80 mg Oral q1800  . carbamide peroxide  10 drop Both Ears Daily  . carvedilol  12.5 mg Oral BID WC  . clopidogrel  75 mg Oral Daily  . doxycycline  100 mg Oral BID  . gabapentin  300 mg Oral BID  . heparin  5,000 Units Subcutaneous Q8H  . sertraline  200 mg Oral Daily  . sodium chloride flush  3 mL Intravenous Q12H   Continuous Infusions: . DOPamine Stopped (10/17/16 0437)   PRN Meds: sodium chloride, acetaminophen, HYDROcodone-acetaminophen, ondansetron (ZOFRAN) IV, sodium chloride flush   Vital Signs    Vitals:   10/16/16 2100 10/16/16 2200 10/16/16 2300 10/17/16 0000  BP: 103/81 102/79 (!) 85/63 95/73  Pulse: 80 77 81 81  Resp: '14 13 12 12  ' Temp:      TempSrc:      SpO2: 97% 99% 97% 99%  Weight:      Height:        Intake/Output Summary (Last 24 hours) at 10/17/16 0757 Last data filed at 10/16/16 2132  Gross per 24 hour  Intake                0 ml  Output              650 ml  Net             -650 ml   Filed Weights   10/16/16 1602 10/16/16 1840  Weight: 189 lb (85.7 kg) 188 lb 15 oz (85.7 kg)    Physical Exam    GEN: Well nourished, well developed, in no acute distress.  HEENT: Grossly normal.  Neck: Supple, no JVD, carotid bruits, or  masses. Cardiac: Irregular irregular, no murmurs, rubs, or gallops. No clubbing, cyanosis, edema.  Radials/DP/PT 2+ and equal bilaterally.  Respiratory:  Respirations regular and unlabored, clear to auscultation bilaterally. GI: Soft, nontender, nondistended, BS + x 4. MS: no deformity or atrophy. Skin: warm and dry, no rash. Cath site without active bleeding, swelling, or bruit.  Neuro:  Strength and sensation are intact. Psych: AAOx3.  Normal affect.  Labs    CBC  Recent Labs  10/16/16 1848 10/17/16 0727  WBC 14.0* 11.2*  NEUTROABS 11.1*  --   HGB 15.5 14.2  HCT 46.0 40.9  MCV 105.0* 102.5*  PLT 183 097   Basic Metabolic Panel  Recent Labs  10/16/16 1848  NA 134*  K 3.4*  CL 104  CO2 23  GLUCOSE 110*  BUN 21*  CREATININE 0.92  CALCIUM 8.6*   Liver Function Tests  Recent Labs  10/16/16 1848  AST 78*  ALT 28  ALKPHOS 49  BILITOT 0.7  PROT 7.3  ALBUMIN 3.8   No results for input(s): LIPASE,  AMYLASE in the last 72 hours. Cardiac Enzymes  Recent Labs  10/16/16 1848 10/17/16 0044  TROPONINI 6.07* 40.69*   BNP Invalid input(s): POCBNP D-Dimer No results for input(s): DDIMER in the last 72 hours. Hemoglobin A1C No results for input(s): HGBA1C in the last 72 hours. Fasting Lipid Panel No results for input(s): CHOL, HDL, LDLCALC, TRIG, CHOLHDL, LDLDIRECT in the last 72 hours. Thyroid Function Tests No results for input(s): TSH, T4TOTAL, T3FREE, THYROIDAB in the last 72 hours.  Invalid input(s): FREET3  Telemetry    Afib, 80's bpm - Personally Reviewed  ECG    EKG 12/13: Afib, 80 bpm, left axis deviation, nonspecific inferior st/t changes - Personally Reviewed  Radiology    Ct Head Wo Contrast  Result Date: 10/16/2016 CLINICAL DATA:  75 year old male with sudden onset of headache lung the cath lab. Diaphoresis. EXAM: CT HEAD WITHOUT CONTRAST TECHNIQUE: Contiguous axial images were obtained from the base of the skull through the vertex  without intravenous contrast. COMPARISON:  None. FINDINGS: Brain: Mild cerebral atrophy. Wedge-shaped area of low attenuation in the posteromedial aspect of the right cerebellar hemisphere, related to an old cerebellar infarction. No evidence of acute infarction, hemorrhage, hydrocephalus, extra-axial collection or mass lesion/mass effect. Vascular: No hyperdense vessel or unexpected calcification. Skull: Normal. Negative for fracture or focal lesion. Sinuses/Orbits: No acute finding. Other: None. IMPRESSION: 1. No acute intracranial abnormalities. 2. Mild cerebral atrophy. 3. Old right cerebellar infarct. Electronically Signed   By: Vinnie Langton M.D.   On: 10/16/2016 18:21    Cardiac Studies   Cardiac cath 10/16/16: Conclusion   Conclusion: 1. Inferior STEMI with thrombotic occlusion of the proximal RCA. 2. 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 3. Upper normal left ventricular filling pressure (LVEDP 15 mmHg). 4. Successful PCI to the occluded proximal RCA with placement of a Xience Alpine 3.5 x 23 mm drug-eluting stent (postdilated with 4.0 mm [place] balloon) with 0% residual stenosis and TIMI-3 flow. 5. Successful placement of temporary transvenous pacing wire via the right femoral vein. Pacing wire was removed at end of the procedure, as the patient was no longer pacer dependent.  Recommendations: 1. Stat head CT to exclude intracranial hemorrhage in the setting of headaches during and after the procedure. 2. If head CT is negative for acute hemorrhage, load with 600 mg of clopidogrel 1, followed by 75 mg daily. 3. Discontinue tirofiban infusion 4 hours after administration of clopidogrel. 4. Continue indefinite low-dose aspirin therapy. 5. Administer atorvastatin 80 mg daily. 6. Check serial troponins until they have peaked, then stop. 7. Obtain transthoracic echocardiogram, fasting lipid panel, and TSH. 8. Perform EKG  to reassess ST segments as well as underlying rhythm; telemetry suspicious for atrial fibrillation. 9. Wean dopamine as blood pressure tolerates to maintain MAP > 65 mmHg. 10. Transfer to ICU on critical care service.  Cardiology will continue to follow.   Echo pending.  Patient Profile     75 y.o. male with history of hypertension, multiple myeloma, multiple falls in the past with subdural hematoma at least 5 years ago, depression, anxiety, alcohol abuse, and chronic pain who presented to [facility] in the evening of 12/12 with chest pain for several hours and was found to be in cardiogenic shock requiring dopamine infusion and venous temp wire initially with both being discontinued at this time in the setting of an acute inferior ST elevation MI s/p PCI/DES. Developed new onset Afib with controlled ventricular  response.   Assessment & Plan    1. Inferior St elevation MI: -Status post cardiac cath as above -No further chest pain -Continue DAPT with ASA 81 mg and Plavix 75 mg daily -Continue Coreg as BP allows, may need to decrease dose to 6.25 mg bid this morning given BP in the 1-teens systolic -Continue Lipitor 80 mg daily, needs FLP as an outpatient -A1C pending -Will need several more days of inpatient monitoring as he is at increased risk of arrhythmia -Echo is pending to evaluate LV systolic function and wall motion  2. Cardiogenic shock: -Dopamine has been discontinued at 4:30 AM on 12/13 with good BP since -Sheath has been removed -Stable  3. New onset Afib: -Controlled ventricular response -Coreg as above -He is a poor candidate for anticoagulation, as he would require triple therapy for at least one month. In the setting of falls, alcohol use, and prior subdural hematoma, it has been felt the risks of chronic anticoagulation outweigh benefits should he remain in atrial fibrillation -TSH pending -Replete potassium as needed, pending at this time -Check magnesium   4.  Headache: -CT head negative -Resolved    Signed, Christell Faith, PA-C [facility] Pager: [phone] 10/17/2016, 7:57 AM

## 2016-10-17 NOTE — Progress Notes (Signed)
Spoke with Dr. Fritzi Mandes, Hospitalist will take over care on their service today. Appreciate assistance.  Vilinda Boehringer, MD [facility] Pager [phone] (please enter 7-digits) On Call Pager - [phone] (please enter 7-digits)

## 2016-10-18 DIAGNOSIS — I2111 ST elevation (STEMI) myocardial infarction involving right coronary artery: Secondary | ICD-10-CM

## 2016-10-18 DIAGNOSIS — L8990 Pressure ulcer of unspecified site, unspecified stage: Secondary | ICD-10-CM | POA: Insufficient documentation

## 2016-10-18 LAB — BASIC METABOLIC PANEL
ANION GAP: 4 — AB (ref 5–15)
BUN: 14 mg/dL (ref 6–20)
CALCIUM: 8.7 mg/dL — AB (ref 8.9–10.3)
CO2: 26 mmol/L (ref 22–32)
CREATININE: 0.78 mg/dL (ref 0.61–1.24)
Chloride: 106 mmol/L (ref 101–111)
GFR calc Af Amer: >60 mL/min (ref >60)
GLUCOSE: 96 mg/dL (ref 65–99)
Potassium: 3.3 mmol/L — ABNORMAL LOW (ref 3.5–5.1)
Sodium: 136 mmol/L (ref 135–145)

## 2016-10-18 LAB — MAGNESIUM: Magnesium: 1.5 mg/dL — ABNORMAL LOW (ref 1.7–2.4)

## 2016-10-18 LAB — HEMOGLOBIN A1C
Hgb A1c MFr Bld: 5.3 % (ref 4.8–5.6)
MEAN PLASMA GLUCOSE: 105 mg/dL

## 2016-10-18 LAB — MRSA PCR SCREENING: MRSA by PCR: NEGATIVE

## 2016-10-18 MED ORDER — POTASSIUM CHLORIDE CRYS ER 20 MEQ PO TBCR
40.0000 meq | EXTENDED_RELEASE_TABLET | Freq: Two times a day (BID) | ORAL | Status: AC
  Administered 2016-10-18 (×2): 40 meq via ORAL
  Filled 2016-10-18 (×2): qty 2

## 2016-10-18 MED ORDER — LISINOPRIL 5 MG PO TABS
5.0000 mg | ORAL_TABLET | Freq: Every day | ORAL | Status: DC
Start: 2016-10-18 — End: 2016-10-19
  Administered 2016-10-18 – 2016-10-19 (×2): 5 mg via ORAL
  Filled 2016-10-18 (×2): qty 1

## 2016-10-18 MED ORDER — MAGNESIUM OXIDE 400 (241.3 MG) MG PO TABS
400.0000 mg | ORAL_TABLET | Freq: Two times a day (BID) | ORAL | Status: DC
  Administered 2016-10-18 – 2016-10-19 (×3): 400 mg via ORAL
  Filled 2016-10-18 (×3): qty 1

## 2016-10-18 MED ORDER — HYDROCODONE-ACETAMINOPHEN 10-325 MG PO TABS
1.0000 | ORAL_TABLET | ORAL | Status: DC | PRN
  Administered 2016-10-18: 1 via ORAL
  Filled 2016-10-18: qty 1

## 2016-10-18 MED ORDER — TRAZODONE HCL 50 MG PO TABS
50.0000 mg | ORAL_TABLET | Freq: Every evening | ORAL | Status: DC | PRN
Start: 2016-10-18 — End: 2016-10-19
  Administered 2016-10-18: 50 mg via ORAL
  Filled 2016-10-18: qty 1

## 2016-10-18 NOTE — Care Management (Signed)
Informed by Norina Buzzard that patient has been declined for transfer to the [facility].

## 2016-10-18 NOTE — Progress Notes (Signed)
Patient Name: Ryan Travis Date of Encounter: 10/18/2016  Primary Cardiologist: End  Hospital Problem List     Principal Problem:   STEMI involving right coronary artery [facility]) Active Problems:   STEMI (ST elevation myocardial infarction) ([place])   Myocardial infarct     Subjective   No acute overnight events. No further chest pain. Ambulated in his room and sat in the recliner on 12/13 without issues. Converted to sinus rhythm at 8:30 AM on 12/13 and has maintained sinus rhythm since. BP in the 140's/90's this morning. Echo was not interpretable.   Inpatient Medications    Scheduled Meds: . aspirin  81 mg Oral Daily  . atorvastatin  80 mg Oral q1800  . carbamide peroxide  10 drop Both Ears Daily  . carvedilol  6.25 mg Oral BID WC  . clopidogrel  75 mg Oral Daily  . doxycycline  100 mg Oral BID  . gabapentin  300 mg Oral BID  . heparin  5,000 Units Subcutaneous Q8H  . sertraline  200 mg Oral Daily  . sodium chloride flush  3 mL Intravenous Q12H   Continuous Infusions:  PRN Meds: sodium chloride, acetaminophen, HYDROcodone-acetaminophen, ondansetron (ZOFRAN) IV, pneumococcal 23 valent vaccine, sodium chloride flush   Vital Signs    Vitals:   10/17/16 1800 10/17/16 2000 10/18/16 0000 10/18/16 0400  BP: 132/88 118/82 123/86 (!) 146/98  Pulse:  85 95 84  Resp:  '19 17 18  ' Temp:  97.9 F (36.6 C) 98.2 F (36.8 C) 98.6 F (37 C)  TempSrc:  Oral Oral Oral  SpO2:  95% 97% 94%  Weight:      Height:        Intake/Output Summary (Last 24 hours) at 10/18/16 0735 Last data filed at 10/18/16 0600  Gross per 24 hour  Intake              890 ml  Output             3250 ml  Net            -2360 ml   Filed Weights   10/16/16 1602 10/16/16 1840  Weight: 189 lb (85.7 kg) 188 lb 15 oz (85.7 kg)    Physical Exam    GEN: Well nourished, well developed, in no acute distress.  HEENT: Grossly normal.  Neck: Supple, no JVD, carotid bruits, or masses. Cardiac: RRR,  no murmurs, rubs, or gallops. No clubbing, cyanosis, edema.  Radials/DP/PT 2+ and equal bilaterally.  Respiratory:  Respirations regular and unlabored, clear to auscultation bilaterally. GI: Soft, nontender, nondistended, BS + x 4. MS: no deformity or atrophy. Skin: warm and dry, no rash. Neuro:  Strength and sensation are intact. Psych: AAOx3.  Normal affect.  Labs    CBC  Recent Labs  10/16/16 1848 10/17/16 0727  WBC 14.0* 11.2*  NEUTROABS 11.1*  --   HGB 15.5 14.2  HCT 46.0 40.9  MCV 105.0* 102.5*  PLT 183 470   Basic Metabolic Panel  Recent Labs  10/16/16 1848 10/17/16 0044 10/17/16 0727  NA 134*  --  138  K 3.4*  --  3.3*  CL 104  --  107  CO2 23  --  19*  GLUCOSE 110*  --  77  BUN 21*  --  19  CREATININE 0.92  --  0.87  CALCIUM 8.6*  --  8.6*  MG  --  1.5*  --    Liver Function Tests  Recent Labs  10/16/16 1848  AST 78*  ALT 28  ALKPHOS 49  BILITOT 0.7  PROT 7.3  ALBUMIN 3.8   No results for input(s): LIPASE, AMYLASE in the last 72 hours. Cardiac Enzymes  Recent Labs  10/16/16 1848 10/17/16 0044 10/17/16 0727  TROPONINI 6.07* 40.69* 36.23*   BNP Invalid input(s): POCBNP D-Dimer No results for input(s): DDIMER in the last 72 hours. Hemoglobin A1C  Recent Labs  10/17/16 0727  HGBA1C 5.3   Fasting Lipid Panel  Recent Labs  10/17/16 0727  CHOL 123  HDL 34*  LDLCALC 65  TRIG 119  CHOLHDL 3.6   Thyroid Function Tests  Recent Labs  10/17/16 0727  TSH 0.853    Telemetry    Maintaining sinus rhythm as of 8:30 AM on 12/13 - Personally Reviewed  ECG    n/a - Personally Reviewed  Radiology    Ct Head Wo Contrast  Result Date: 10/16/2016 CLINICAL DATA:  75 year old male with sudden onset of headache lung the cath lab. Diaphoresis. EXAM: CT HEAD WITHOUT CONTRAST TECHNIQUE: Contiguous axial images were obtained from the base of the skull through the vertex without intravenous contrast. COMPARISON:  None. FINDINGS: Brain:  Mild cerebral atrophy. Wedge-shaped area of low attenuation in the posteromedial aspect of the right cerebellar hemisphere, related to an old cerebellar infarction. No evidence of acute infarction, hemorrhage, hydrocephalus, extra-axial collection or mass lesion/mass effect. Vascular: No hyperdense vessel or unexpected calcification. Skull: Normal. Negative for fracture or focal lesion. Sinuses/Orbits: No acute finding. Other: None. IMPRESSION: 1. No acute intracranial abnormalities. 2. Mild cerebral atrophy. 3. Old right cerebellar infarct. Electronically Signed   By: Vinnie Langton M.D.   On: 10/16/2016 18:21    Cardiac Studies   Cardiac cath 10/16/16: Conclusion   Conclusion: 1. Inferior STEMI with thrombotic occlusion of the proximal RCA. 2. 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 3. Upper normal left ventricular filling pressure (LVEDP 15 mmHg). 4. Successful PCI to the occluded proximal RCA with placement of a Xience Alpine 3.5 x 23 mm drug-eluting stent (postdilated with 4.0 mm [place] balloon) with 0% residual stenosis and TIMI-3 flow. 5. Successful placement of temporary transvenous pacing wire via the right femoral vein. Pacing wire was removed at end of the procedure, as the patient was no longer pacer dependent.  Recommendations: 1. Stat head CT to exclude intracranial hemorrhage in the setting of headaches during and after the procedure. 2. If head CT is negative for acute hemorrhage, load with 600 mg of clopidogrel 1, followed by 75 mg daily. 3. Discontinue tirofiban infusion 4 hours after administration of clopidogrel. 4. Continue indefinite low-dose aspirin therapy. 5. Administer atorvastatin 80 mg daily. 6. Check serial troponins until they have peaked, then stop. 7. Obtain transthoracic echocardiogram, fasting lipid panel, and TSH. 8. Perform EKG to reassess ST segments as well as underlying rhythm; telemetry  suspicious for atrial fibrillation. 9. Wean dopamine as blood pressure tolerates to maintain MAP > 65 mmHg. 10. Transfer to ICU on critical care service. Cardiology will continue to follow.   Echo 10/17/16:Study Conclusions  - Uninterpretable echocardiogram due to severe chest wall and/or   lung interference.  Recommendations:  Consider alternative cardiac imaging modality, as clinically indicated.   Patient Profile     75 y.o. male with history of hypertension, multiple myeloma, multiple falls in the past with subdural hematoma at least 5 years ago, depression, anxiety, alcohol abuse, and chronic pain who presented to  [place] in the evening of 12/12 with chest pain for several hours and was found to be in cardiogenic shock requiring dopamine infusion and venous temp wire initially with both being discontinued at this time in the setting of an acute inferior ST elevation MI s/p PCI/DES. Developed new onset Afib with controlled ventricular response, converting to NSR at 8:30 AM on 12/13.  Assessment & Plan    1.  Inferior St elevation MI: -Status post cardiac cath as above -No further chest pain -Continue DAPT with ASA 81 mg and Plavix 75 mg daily -Start low-dose Coreg as BP allows -Continue Lipitor 80 mg daily, needs FLP as an outpatient -A1C 5.3% -Will need one more day of inpatient monitoring as he is at increased risk of arrhythmia -Echo was not interpretable, will reorder limited study to assess EF this admission  2. Cardiogenic shock: -Dopamine has been discontinued at 4:30 AM on 12/13 with good BP since -Sheath has been removed -Stable  3. New onset Afib: -Converted to sinus rhythm at 8:30 AM on 12/13 and has maintained sinus rhythm since -Coreg restarted as above this morning -He is a poor candidate for anticoagulation, as he would require triple therapy for at least one month. In the setting of falls, alcohol use, and prior subdural hematoma, it has been felt the risks  of chronic anticoagulation outweigh benefits should he remain in atrial fibrillation -TSH normal -Replete potassium as needed to goal of 4.0, pending at this time -Replete magnesium to goal of 2.0  4. Headache: -CT head negative -Resolved  5. Dispo: -Should be ok for transfer to 2A today, ambulate in the hallway   Signed, Marcille Blanco [place] Pager: [phone] 10/18/2016, 7:35 AM

## 2016-10-18 NOTE — Progress Notes (Signed)
CH made a follow-up of the Pt, CH had seen in the Ed. Pt was with his wife and daughter in the Rm at the time of this visit. Pt was in good spirit, he talked to [facility] about this life, his family, and the Christmas gift he had received form his one of his daughter. Before exiting the Rm, CH offered prayers for the Pt and his family, and then left.     10/18/16 1600  Clinical Encounter Type  Visited With Patient;Patient and family together  Visit Type Follow-up;Spiritual support  Spiritual Encounters  Spiritual Needs Prayer

## 2016-10-18 NOTE — Care Management (Signed)
Updated [facility] Ryan Travis that patient plan to move out to 2A today. Updated notes faxed/received.

## 2016-10-18 NOTE — Progress Notes (Addendum)
[place] [facility] NAME: Ryan Travis    MR#:  [id number]  DATE OF BIRTH:  28-Mar-1941  SUBJECTIVE:   Denies any chest pain. he is off dopamine dripMore than 24 hours. Vital stable. Patient asymptomatic. Anxiously waiting for transfer to regular floor. REVIEW OF SYSTEMS:   Review of Systems  Constitutional: Negative for chills, fever and weight loss.  HENT: Negative for ear discharge, ear pain and nosebleeds.   Eyes: Negative for blurred vision, pain and discharge.  Respiratory: Negative for sputum production, shortness of breath, wheezing and stridor.   Cardiovascular: Negative for chest pain, palpitations, orthopnea and PND.  Gastrointestinal: Negative for abdominal pain, diarrhea, nausea and vomiting.  Genitourinary: Negative for frequency and urgency.  Musculoskeletal: Negative for back pain and joint pain.  Neurological: Negative for sensory change, speech change, focal weakness and weakness.  Psychiatric/Behavioral: Negative for depression and hallucinations. The patient is not nervous/anxious.    Tolerating Diet:yes Tolerating PT: Pending  DRUG ALLERGIES:   Allergies  Allergen Reactions  . Morphine And Related   . Neomycin   . Other     METHIOLATE  . Penicillins     VITALS:  Blood pressure 128/88, pulse 85, temperature 98.4 F (36.9 C), temperature source Oral, resp. rate 18, height '6\' 2"'  (1.88 m), weight 85.7 kg (188 lb 15 oz), SpO2 98 %.  PHYSICAL EXAMINATION:   Physical Exam  GENERAL:  75 y.o.-year-old patient lying in the bed with no acute distress.  EYES: Pupils equal, round, reactive to light and accommodation. No scleral icterus. Extraocular muscles intact.  HEENT: Head atraumatic, normocephalic. Oropharynx and nasopharynx clear.  NECK:  Supple, no jugular venous distention. No thyroid enlargement, no tenderness.  LUNGS: Normal breath sounds bilaterally, no wheezing, rales, rhonchi. No use of accessory  muscles of respiration.  CARDIOVASCULAR: S1, S2 normal. No murmurs, rubs, or gallops.  ABDOMEN: Soft, nontender, nondistended. Bowel sounds present. No organomegaly or mass.  EXTREMITIES: No cyanosis, clubbing or edema b/l.    NEUROLOGIC: Cranial nerves II through XII are intact. No focal Motor or sensory deficits b/l.   PSYCHIATRIC:  patient is alert and oriented x 3.  SKIN: No obvious rash, lesion, or ulcer.   LABORATORY PANEL:  CBC  Recent Labs Lab 10/17/16 0727  WBC 11.2*  HGB 14.2  HCT 40.9  PLT 171    Chemistries   Recent Labs Lab 10/16/16 1848  10/18/16 0815  NA 134*  < > 136  K 3.4*  < > 3.3*  CL 104  < > 106  CO2 23  < > 26  GLUCOSE 110*  < > 96  BUN 21*  < > 14  CREATININE 0.92  < > 0.78  CALCIUM 8.6*  < > 8.7*  MG  --   < > 1.5*  AST 78*  --   --   ALT 28  --   --   ALKPHOS 49  --   --   BILITOT 0.7  --   --   < > = values in this interval not displayed. Cardiac Enzymes  Recent Labs Lab 10/17/16 0727  TROPONINI 36.23*   RADIOLOGY:  Ct Head Wo Contrast  Result Date: 10/16/2016 CLINICAL DATA:  75 year old male with sudden onset of headache lung the cath lab. Diaphoresis. EXAM: CT HEAD WITHOUT CONTRAST TECHNIQUE: Contiguous axial images were obtained from the base of the skull through the vertex without intravenous contrast. COMPARISON:  None. FINDINGS: Brain: Mild cerebral  atrophy. Wedge-shaped area of low attenuation in the posteromedial aspect of the right cerebellar hemisphere, related to an old cerebellar infarction. No evidence of acute infarction, hemorrhage, hydrocephalus, extra-axial collection or mass lesion/mass effect. Vascular: No hyperdense vessel or unexpected calcification. Skull: Normal. Negative for fracture or focal lesion. Sinuses/Orbits: No acute finding. Other: None. IMPRESSION: 1. No acute intracranial abnormalities. 2. Mild cerebral atrophy. 3. Old right cerebellar infarct. Electronically Signed   By: Vinnie Langton M.D.   On:  10/16/2016 18:21   ASSESSMENT AND PLAN:   Ryan Travis is a 75 y.o. male with history of hypertension, multiple myeloma, multiple falls in the past with subdural hematoma at least 5 years ago, depression, anxiety, alcohol abuse, and chronic pain, and we have been asked to see due to chest pain and EKG changes consistent with STEMI  1.  Acute Inferior St elevation MI: -Status post cardiac cath with successful PCI to the proximal RCA with restoration of TIMI-3 flow -No further chest pain -Continue Dual therapy with ASA 81 mg and Plavix 75 mg daily -Continue Coreg as BP allows, may need to decrease dose to 6.25 mg bid -Continue Lipitor 80 mg daily, needs FLP as an outpatient  2. Cardiogenic shock post MI -Dopamine has been discontinued at 4:30 AM on 12/13 with good BP since -Stable  3. New onset Afib: -Controlled ventricular response -Coreg as above -He is a poor candidate for anticoagulation, as he would require triple therapy for at least one month. In the setting of falls, alcohol use, and prior subdural hematoma, it has been felt the risks of chronic anticoagulation outweigh benefits should he remain in atrial fibrillation, CT head negative -Replete potassium as needed-Check magnesium   4.depression/anxiety cont zoloft  Oral improving. Patient will be starting physical therapy. Spoke with the physician at [facility] after they called me for possible transfer. Patient is doing well may likely discharge tomorrow. [facility] aware. He will get back in touch with me after he discusses with his administrator.   Case discussed with Care Management/Social Worker. Management plans discussed with the patient, family and they are in agreement.  CODE STATUS: full  DVT Prophylaxis:lovenox TOTAL TIME TAKING CARE OF THIS PATIENT: 30 minutes.  >50% time spent on counselling and coordination of care  POSSIBLE D/C IN 1-2 DAYS, DEPENDING ON CLINICAL CONDITION.  Note: This dictation was prepared with  Dragon dictation along with smaller phrase technology. Any transcriptional errors that result from this process are unintentional.  [name] M.D on 10/18/2016 at 1:54 PM  Between 7am to 6pm - Pager - [phone]  After 6pm go to [email or web address] - password [email or web address] [facility]  Office  [phone]  CC: Primary care physician; PROVIDER NOT IN SYSTEM

## 2016-10-18 NOTE — Progress Notes (Signed)
Report given to Vernie Shanks, RN on 2A- Patient being transferred to room 231.

## 2016-10-19 LAB — BASIC METABOLIC PANEL
ANION GAP: 5 (ref 5–15)
BUN: 15 mg/dL (ref 6–20)
CALCIUM: 8.7 mg/dL — AB (ref 8.9–10.3)
CO2: 25 mmol/L (ref 22–32)
Chloride: 107 mmol/L (ref 101–111)
Creatinine, Ser: 0.73 mg/dL (ref 0.61–1.24)
Glucose, Bld: 80 mg/dL (ref 65–99)
POTASSIUM: 3.6 mmol/L (ref 3.5–5.1)
SODIUM: 137 mmol/L (ref 135–145)

## 2016-10-19 LAB — MAGNESIUM: MAGNESIUM: 1.6 mg/dL — AB (ref 1.7–2.4)

## 2016-10-19 MED ORDER — CLOPIDOGREL BISULFATE 75 MG PO TABS: 75.0000 mg | ORAL_TABLET | Freq: Every day | ORAL | 1 refills | Status: DC

## 2016-10-19 MED ORDER — LISINOPRIL 5 MG PO TABS: 5.0000 mg | ORAL_TABLET | Freq: Every day | ORAL | 1 refills | Status: DC

## 2016-10-19 MED ORDER — ATORVASTATIN CALCIUM 80 MG PO TABS: 80.0000 mg | ORAL_TABLET | Freq: Every day | ORAL | 1 refills | Status: DC

## 2016-10-19 NOTE — Progress Notes (Signed)
Patient is discharge home in a stable condition, summary and f/u care given, verbalized understanding , left with family 

## 2016-10-19 NOTE — Progress Notes (Signed)
Patient Name: Ryan Travis Date of Encounter: 10/19/2016  Primary Cardiologist: End  Hospital Problem List     Principal Problem:   STEMI involving right coronary artery [facility]) Active Problems:   STEMI (ST elevation myocardial infarction) ([place])   Myocardial infarct   Pressure injury of skin     Subjective   He is doing well with no chest pain or Shortness of breath. No arrhythmia overnight. He wants to go home today.  Inpatient Medications    Scheduled Meds: . aspirin  81 mg Oral Daily  . atorvastatin  80 mg Oral q1800  . carbamide peroxide  10 drop Both Ears Daily  . carvedilol  6.25 mg Oral BID WC  . clopidogrel  75 mg Oral Daily  . doxycycline  100 mg Oral BID  . gabapentin  300 mg Oral BID  . heparin  5,000 Units Subcutaneous Q8H  . lisinopril  5 mg Oral Daily  . magnesium oxide  400 mg Oral BID  . sertraline  200 mg Oral Daily  . sodium chloride flush  3 mL Intravenous Q12H   Continuous Infusions:  PRN Meds: sodium chloride, acetaminophen, HYDROcodone-acetaminophen, ondansetron (ZOFRAN) IV, pneumococcal 23 valent vaccine, sodium chloride flush, traZODone   Vital Signs    Vitals:   10/18/16 0900 10/18/16 1223 10/18/16 1849 10/19/16 0606  BP:  128/88 131/86 118/78  Pulse:  85 83 69  Resp:  18  17  Temp: 98.7 F (37.1 C) 98.4 F (36.9 C) 97.7 F (36.5 C) 97.5 F (36.4 C)  TempSrc: Oral Oral Oral Oral  SpO2:  98% 98% 94%  Weight:      Height:        Intake/Output Summary (Last 24 hours) at 10/19/16 0848 Last data filed at 10/19/16 0748  Gross per 24 hour  Intake              360 ml  Output              625 ml  Net             -265 ml   Filed Weights   10/16/16 1602 10/16/16 1840  Weight: 189 lb (85.7 kg) 188 lb 15 oz (85.7 kg)    Physical Exam    GEN: Well nourished, well developed, in no acute distress.  HEENT: Grossly normal.  Neck: Supple, no JVD, carotid bruits, or masses. Cardiac: RRR, no murmurs, rubs, or gallops. No  clubbing, cyanosis, edema.  Radials/DP/PT 2+ and equal bilaterally.  Respiratory:  Respirations regular and unlabored, clear to auscultation bilaterally. GI: Soft, nontender, nondistended, BS + x 4. MS: no deformity or atrophy. Skin: warm and dry, no rash. Neuro:  Strength and sensation are intact. Psych: AAOx3.  Normal affect.  Labs    CBC  Recent Labs  10/16/16 1848 10/17/16 0727  WBC 14.0* 11.2*  NEUTROABS 11.1*  --   HGB 15.5 14.2  HCT 46.0 40.9  MCV 105.0* 102.5*  PLT 183 474   Basic Metabolic Panel  Recent Labs  10/18/16 0815 10/19/16 0619  NA 136 137  K 3.3* 3.6  CL 106 107  CO2 26 25  GLUCOSE 96 80  BUN 14 15  CREATININE 0.78 0.73  CALCIUM 8.7* 8.7*  MG 1.5* 1.6*   Liver Function Tests  Recent Labs  10/16/16 1848  AST 78*  ALT 28  ALKPHOS 49  BILITOT 0.7  PROT 7.3  ALBUMIN 3.8   No results for input(s): LIPASE, AMYLASE  in the last 72 hours. Cardiac Enzymes  Recent Labs  10/16/16 1848 10/17/16 0044 10/17/16 0727  TROPONINI 6.07* 40.69* 36.23*   BNP Invalid input(s): POCBNP D-Dimer No results for input(s): DDIMER in the last 72 hours. Hemoglobin A1C  Recent Labs  10/17/16 0727  HGBA1C 5.3   Fasting Lipid Panel  Recent Labs  10/17/16 0727  CHOL 123  HDL 34*  LDLCALC 65  TRIG 119  CHOLHDL 3.6   Thyroid Function Tests  Recent Labs  10/17/16 0727  TSH 0.853    Telemetry    Maintaining sinus rhythm  With PACs - Personally Reviewed  ECG    n/a - Personally Reviewed  Radiology    No results found.  Cardiac Studies   Cardiac cath 10/16/16: Conclusion   Conclusion: 1. Inferior STEMI with thrombotic occlusion of the proximal RCA. 2. 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 3. Upper normal left ventricular filling pressure (LVEDP 15 mmHg). 4. Successful PCI to the occluded proximal RCA with placement of a Xience Alpine 3.5 x 23 mm drug-eluting  stent (postdilated with 4.0 mm [place] balloon) with 0% residual stenosis and TIMI-3 flow. 5. Successful placement of temporary transvenous pacing wire via the right femoral vein. Pacing wire was removed at end of the procedure, as the patient was no longer pacer dependent.  Recommendations: 1. Stat head CT to exclude intracranial hemorrhage in the setting of headaches during and after the procedure. 2. If head CT is negative for acute hemorrhage, load with 600 mg of clopidogrel 1, followed by 75 mg daily. 3. Discontinue tirofiban infusion 4 hours after administration of clopidogrel. 4. Continue indefinite low-dose aspirin therapy. 5. Administer atorvastatin 80 mg daily. 6. Check serial troponins until they have peaked, then stop. 7. Obtain transthoracic echocardiogram, fasting lipid panel, and TSH. 8. Perform EKG to reassess ST segments as well as underlying rhythm; telemetry suspicious for atrial fibrillation. 9. Wean dopamine as blood pressure tolerates to maintain MAP > 65 mmHg. 10. Transfer to ICU on critical care service. Cardiology will continue to follow.   Echo 10/17/16:Study Conclusions  - Uninterpretable echocardiogram due to severe chest wall and/or   lung interference.  Recommendations:  Consider alternative cardiac imaging modality, as clinically indicated.   Patient Profile     75 y.o. male with history of hypertension, multiple myeloma, multiple falls in the past with subdural hematoma at least 5 years ago, depression, anxiety, alcohol abuse, and chronic pain who presented to [facility] in the evening of 12/12 with chest pain for several hours and was found to be in cardiogenic shock requiring dopamine infusion and venous temp wire initially with both being discontinued at this time in the setting of an acute inferior ST elevation MI s/p PCI/DES. Developed new onset Afib with controlled ventricular response, converting to NSR at 8:30 AM on 12/13.  Assessment & Plan    1.   Inferior St elevation MI: -Status PCI and drug-eluting stent placement to the right coronary artery. -No further chest pain -Continue DAPT with ASA 81 mg and Plavix 75 mg daily for 1 year -Continue Lipitor 80 mg daily, needs FLP as an outpatient -A1C 5.3% - The patient can be discharged home from a cardiac standpoint. He is going to follow-up at the [place]. I recommend follow-up within one to 2 weeks.  2. Cardiogenic shock:  Resolved.   3. New onset Afib: - This was overall brief post MI. I do not recommend  long-term anticoagulation especially that he is on dual antiplatelet therapy. If he develops recurrent atrial fibrillation in the future, anticoagulation can be started and aspirin can be discontinued.  4.  Ischemic cardiomyopathy: Left ventricular angiography was not performed during cath. Echo was not diagnostic but I suspect that his ejection fraction is reduced. Thus, he was started on carvedilol and lisinopril.   Signed, Kathlyn Sacramento, MD  10/19/2016, 8:48 AM

## 2016-10-19 NOTE — Progress Notes (Signed)
A&O. Up with one assist. Medicated for pain and insomnia. Slept well through the night. Groin unremarkable.

## 2016-10-19 NOTE — Evaluation (Signed)
Physical Therapy Evaluation Patient Details Name: Ryan Travis MRN: [id number] DOB: 08-25-1941 Today's Date: 10/19/2016   History of Present Illness  Pt is a 75 y/o M who presented to the ED with chest pain.  Pt admitted for STEMI, s/p L heart cath and stent placement.  Pt's PMH includes alcohol abuse, anxiety, aseptic bony necrosis, depression, cancer, neuropathy, chronic pain, ACL reconstruction surgery, Bil THA, fall and subdural hematoma.     Clinical Impression  Pt admitted with above diagnosis. Pt currently with functional limitations due to the deficits listed below (see PT Problem List). Mr. Fagg presents 1/5 L quad strength and uses L knee brace locked in extension and built up shoe for OOB mobility. He demonstrates unsteadiness with transfers and ambulation but does not demonstrate LOB and does not require outside assist.  As evidenced by pt's balance testing via Berg Balance Test and the DGI, pt is at a high risk of falling.  Pt relies heavily on his vision for his balance and demonstrates LOB when he closes his eyes or dons a shirt.  Therefore, recommending OPPT at d/c to address balance impairments.  Results of these balance measures were relayed to the pt and he is agreeable to OPPT. Pt will benefit from skilled PT to increase their independence and safety with mobility to allow discharge to the venue listed below.      Follow Up Recommendations Outpatient PT (to address balance impairments)    Equipment Recommendations  None recommended by PT    Recommendations for Other Services       Precautions / Restrictions Precautions Precautions: Fall Required Braces or Orthoses: Other Brace/Splint Other Brace/Splint: Knee brace locked in extension with OOB mobility.  Built up shoe due to leg length discrepancy.  Restrictions Weight Bearing Restrictions: No      Mobility  Bed Mobility Overal bed mobility: Independent             General bed mobility comments: No cues  or physical assist needed.    Transfers Overall transfer level: Needs assistance Equipment used: Rolling walker (2 wheeled) Transfers: Sit to/from Stand Sit to Stand: Supervision         General transfer comment: Supervision for safety as pt mildly unstable as he rises to stand and to sit.  Proper hand placement.  Ambulation/Gait Ambulation/Gait assistance: Supervision Ambulation Distance (Feet): 300 Feet Assistive device: Rolling walker (2 wheeled) Gait Pattern/deviations: Step-through pattern;Decreased stride length;Antalgic   Gait velocity interpretation: at or above normal speed for age/gender General Gait Details: This was the pt's first time ambulating with built up shoe which pt reports has improved his balance. Mild instability but pt able to maintain balance without LOB.  HR up to 113.    Stairs            Wheelchair Mobility    Modified Rankin (Stroke Patients Only)       Balance Overall balance assessment: Needs assistance;History of Falls Sitting-balance support: No upper extremity supported;Feet supported Sitting balance-Leahy Scale: Good     Standing balance support: During functional activity;No upper extremity supported Standing balance-Leahy Scale: Fair Standing balance comment: Able to stand statically without UE support but requires RW for dynamic activities                 Standardized Balance Assessment Standardized Balance Assessment : Berg Balance Test;Dynamic Gait Index Berg Balance Test Sit to Stand: Able to stand  independently using hands Standing Unsupported: Able to stand safely 2 minutes Sitting with  Back Unsupported but Feet Supported on Floor or Stool: Able to sit safely and securely 2 minutes Stand to Sit: Sits safely with minimal use of hands Transfers: Able to transfer safely, minor use of hands Standing Unsupported with Eyes Closed: Needs help to keep from falling Standing Ubsupported with Feet Together: Able to place  feet together independently and stand for 1 minute with supervision From Standing, Reach Forward with Outstretched Arm: Can reach forward >12 cm safely (5") From Standing Position, Pick up Object from Floor: Unable to try/needs assist to keep balance (due to L knee extension brace) From Standing Position, Turn to Look Behind Over each Shoulder: Looks behind one side only/other side shows less weight shift Turn 360 Degrees: Needs close supervision or verbal cueing Standing Unsupported, Alternately Place Feet on Step/Stool: Needs assistance to keep from falling or unable to try Standing Unsupported, One Foot in Front: Able to take small step independently and hold 30 seconds Standing on One Leg: Unable to try or needs assist to prevent fall Total Score: 31 Dynamic Gait Index Level Surface: Mild Impairment Change in Gait Speed: Normal Gait with Horizontal Head Turns: Normal Gait with Vertical Head Turns: Mild Impairment Gait and Pivot Turn: Mild Impairment Step Over Obstacle: Mild Impairment Step Around Obstacles: Normal       Pertinent Vitals/Pain Pain Assessment: No/denies pain    Home Living Family/patient expects to be discharged to:: Private residence Living Arrangements: Spouse/significant other Available Help at Discharge: Family;Available 24 hours/day Type of Home: House Home Access: Stairs to enter Entrance Stairs-Rails: Left;Right;Can reach both Entrance Stairs-Number of Steps: 3 Home Layout: Two level;Able to live on main level with bedroom/bathroom Home Equipment: Wheelchair - [employer] - 2 wheels;Cane - single point;Crutches;Bedside commode;Shower seat      Prior Function Level of Independence: Needs assistance   Gait / Transfers Assistance Needed: Ambulating limited community distances with RW but otherwise utilizes WC in home and for longer distances.    ADL's / Homemaking Assistance Needed: Needs assist from wife for tub transfers and for donning L shoe once  knee brace in place  Comments: Pt reports 2 falls in the past 6 months, both occurred when transferring out of the shower     Hand Dominance        Extremity/Trunk Assessment   Upper Extremity Assessment Upper Extremity Assessment: Overall WFL for tasks assessed    Lower Extremity Assessment Lower Extremity Assessment: RLE deficits/detail;LLE deficits/detail RLE Deficits / Details: h/o R ankle surgery. 0 deg PF, ~10 deg DF.  Otherwise strength WNL LLE Deficits / Details: 1/5 knee extension, otherwise WFL    Cervical / Trunk Assessment Cervical / Trunk Assessment: Normal  Communication   Communication: No difficulties  Cognition Arousal/Alertness: Awake/alert Behavior During Therapy: WFL for tasks assessed/performed Overall Cognitive Status: Within Functional Limits for tasks assessed                      General Comments General comments (skin integrity, edema, etc.): As evidenced by pt's balance testing via Berg Balance Test and the DGI pt is at a high risk of falling.  Pt relies heavily on his vision for his balance and demonstrates LOB when he closes his eyes or dons a shirt.  Therefore, recommending OPPT at d/c to address balance impairments.  Results of these balance measures were relayed to the pt and he is agreeable to OPPT.    Exercises     Assessment/Plan    PT Assessment Patient needs continued  PT services  PT Problem List Decreased strength;Decreased range of motion;Decreased activity tolerance;Decreased balance;Decreased knowledge of use of DME;Decreased safety awareness          PT Treatment Interventions DME instruction;Gait training;Stair training;Functional mobility training;Therapeutic activities;Therapeutic exercise;Balance training;Neuromuscular re-education;Patient/family education    PT Goals (Current goals can be found in the Care Plan section)  Acute Rehab PT Goals Patient Stated Goal: to go home PT Goal Formulation: With patient Time  For Goal Achievement: 11/02/16 Potential to Achieve Goals: Good    Frequency Min 2X/week   Barriers to discharge        Co-evaluation               End of Session Equipment Utilized During Treatment: Gait belt Activity Tolerance: Patient tolerated treatment well Patient left: in bed;with call bell/phone within reach;with bed alarm set Nurse Communication: Mobility status         Time: [id number] PT Time Calculation (min) (ACUTE ONLY): 30 min   Charges:   PT Evaluation $PT Eval Low Complexity: 1 Procedure PT Treatments $Gait Training: 8-22 mins   PT G Codes:       Collie Siad PT, DPT 10/19/2016, 10:53 AM

## 2016-10-19 NOTE — Discharge Summary (Signed)
[facility] [place] NAME: Ryan Travis    MR#:  [id number]  DATE OF BIRTH:  03-27-1941  DATE OF ADMISSION:  10/16/2016 ADMITTING PHYSICIAN: Nelva Bush, MD  DATE OF DISCHARGE: 10/19/16  PRIMARY CARE PHYSICIAN: PROVIDER NOT IN SYSTEM    ADMISSION DIAGNOSIS:  STEMI involving right coronary artery ([place]) [I21.11]  DISCHARGE DIAGNOSIS:  Acute STEMI  S/p PCI and stent to RCA Cardiogenic shock post MI-resolved CAD  SECONDARY DIAGNOSIS:   Past Medical History:  Diagnosis Date  . Alcohol abuse   . Anxiety   . Arthritis   . Aseptic bony necrosis ([place])   . Cancer ([place])    multiple myeloma  . Depression   . Dysrhythmia   . Fall    3 weeks ago/ torn ligaments left knee/wearing brace/pt  . Hypertension   . Neuropathy ([place])   . Pain    chronic leg and ankle pain  . RLS (restless legs syndrome)     HOSPITAL COURSE:  Ryan Bob Perryis a 75 y.o.malewith history of hypertension, multiple myeloma, multiple falls in the past with subdural hematoma at least 5 years ago, depression, anxiety, alcohol abuse, and chronic pain, and we have been asked to see due to chest pain and EKG changes consistent with STEMI  1.  Acute Inferior St elevation MI: -Status post cardiac cath with successful PCI to the proximal RCA with restoration of TIMI-3 flow -No further chest pain -Continue Dual therapy with ASA 81 mg and Plavix 75 mg daily -Continue Coreg as BP allows, may need to decrease dose to 6.25 mg bid -Continue Lipitor 80 mg daily  2. Cardiogenic shock post MI -Dopamine has been discontinued at 4:30 AM on 12/13 with good BP since -Stable  3. New onset Afib:-resolved now in SR -Controlled ventricular response -Coreg as above - CT head negative  4.depression/anxiety cont zoloft  Oral improving. Patient will be starting physical therapy. Spoke with the physician at [facility] after they called me for possible transfer. Since pt  is well  And will d/c to home. [facility] transfer was cancelled after discussing with [facility] MD's  CONSULTS OBTAINED:  Treatment Team:  Nelva Bush, MD  DRUG ALLERGIES:   Allergies  Allergen Reactions  . Morphine And Related   . Neomycin   . Other     METHIOLATE  . Penicillins     DISCHARGE MEDICATIONS:   Current Discharge Medication List    START taking these medications   Details  atorvastatin (LIPITOR) 80 MG tablet Take 1 tablet (80 mg total) by mouth daily at 6 PM. Qty: 30 tablet, Refills: 1    clopidogrel (PLAVIX) 75 MG tablet Take 1 tablet (75 mg total) by mouth daily. Qty: 30 tablet, Refills: 1    lisinopril (PRINIVIL,ZESTRIL) 5 MG tablet Take 1 tablet (5 mg total) by mouth daily. Qty: 30 tablet, Refills: 1      CONTINUE these medications which have NOT CHANGED   Details  AMLODIPINE BESYLATE PO Take 10 mg by mouth daily.    carbamide peroxide (DEBROX) 6.5 % otic solution Place 10 drops into both ears daily.    carvedilol (COREG) 12.5 MG tablet Take 12.5 mg by mouth 2 (two) times daily with a meal.    gabapentin (NEURONTIN) 300 MG capsule Take 300 mg by mouth 2 (two) times daily. 1 am 3 hs    HYDROcodone-acetaminophen (NORCO) 10-325 MG tablet Take 1 tablet by mouth.    sertraline (ZOLOFT) 100 MG  tablet Take 200 mg by mouth daily.    Testosterone Cypionate 200 MG/ML KIT Inject 1 mL into the muscle every 21 ( twenty-one) days.      STOP taking these medications     doxycycline (MONODOX) 100 MG capsule      HYDROcodone-acetaminophen (NORCO/VICODIN) 5-325 MG tablet      sildenafil (VIAGRA) 100 MG tablet         If you experience worsening of your admission symptoms, develop shortness of breath, life threatening emergency, suicidal or homicidal thoughts you must seek medical attention immediately by calling 911 or calling your MD immediately  if symptoms less severe.  You Must read complete instructions/literature along with all the possible adverse  reactions/side effects for all the Medicines you take and that have been prescribed to you. Take any new Medicines after you have completely understood and accept all the possible adverse reactions/side effects.   Please note  You were cared for by a hospitalist during your hospital stay. If you have any questions about your discharge medications or the care you received while you were in the hospital after you are discharged, you can call the unit and asked to speak with the hospitalist on call if the hospitalist that took care of you is not available. Once you are discharged, your primary care physician will handle any further medical issues. Please note that NO REFILLS for any discharge medications will be authorized once you are discharged, as it is imperative that you return to your primary care physician (or establish a relationship with a primary care physician if you do not have one) for your aftercare needs so that they can reassess your need for medications and monitor your lab values. Today   SUBJECTIVE   Doing well  VITAL SIGNS:  Blood pressure (!) 128/91, pulse 84, temperature 98.3 F (36.8 C), temperature source Oral, resp. rate 18, height '6\' 2"'  (1.88 m), weight 85.7 kg (188 lb 15 oz), SpO2 97 %.  I/O:   Intake/Output Summary (Last 24 hours) at 10/19/16 1052 Last data filed at 10/19/16 1030  Gross per 24 hour  Intake              240 ml  Output              500 ml  Net             -260 ml    PHYSICAL EXAMINATION:  GENERAL:  75 y.o.-year-old patient lying in the bed with no acute distress.  EYES: Pupils equal, round, reactive to light and accommodation. No scleral icterus. Extraocular muscles intact.  HEENT: Head atraumatic, normocephalic. Oropharynx and nasopharynx clear.  NECK:  Supple, no jugular venous distention. No thyroid enlargement, no tenderness.  LUNGS: Normal breath sounds bilaterally, no wheezing, rales,rhonchi or crepitation. No use of accessory muscles of  respiration.  CARDIOVASCULAR: S1, S2 normal. No murmurs, rubs, or gallops.  ABDOMEN: Soft, non-tender, non-distended. Bowel sounds present. No organomegaly or mass.  EXTREMITIES: No pedal edema, cyanosis, or clubbing.  NEUROLOGIC: Cranial nerves II through XII are intact. Muscle strength 5/5 in all extremities. Sensation intact. Gait not checked.  PSYCHIATRIC: The patient is alert and oriented x 3.  SKIN: No obvious rash, lesion, or ulcer.   DATA REVIEW:   CBC   Recent Labs Lab 10/17/16 0727  WBC 11.2*  HGB 14.2  HCT 40.9  PLT 171    Chemistries   Recent Labs Lab 10/16/16 1848  10/19/16 0619  NA 134*  < >  137  K 3.4*  < > 3.6  CL 104  < > 107  CO2 23  < > 25  GLUCOSE 110*  < > 80  BUN 21*  < > 15  CREATININE 0.92  < > 0.73  CALCIUM 8.6*  < > 8.7*  MG  --   < > 1.6*  AST 78*  --   --   ALT 28  --   --   ALKPHOS 49  --   --   BILITOT 0.7  --   --   < > = values in this interval not displayed.  Microbiology Results   Recent Results (from the past 240 hour(s))  MRSA PCR Screening     Status: None   Collection Time: 10/16/16  6:40 PM  Result Value Ref Range Status   MRSA by PCR NEGATIVE NEGATIVE Final    Comment:        The GeneXpert MRSA Assay (FDA approved for NASAL specimens only), is one component of a comprehensive MRSA colonization surveillance program. It is not intended to diagnose MRSA infection nor to guide or monitor treatment for MRSA infections.     RADIOLOGY:  No results found.   Management plans discussed with the patient, family and they are in agreement.  CODE STATUS:     Code Status Orders        Start     Ordered   10/16/16 1835  Full code  Continuous     10/16/16 1834    Code Status History    Date Active Date Inactive Code Status Order ID Comments User Context   This patient has a current code status but no historical code status.      TOTAL TIME TAKING CARE OF THIS PATIENT: 40 minutes.    Mattingly Fountaine M.D on  10/19/2016 at 10:52 AM  Between 7am to 6pm - Pager - [phone] After 6pm go to [email or web address] - password [email or web address] [facility]  Office  [phone]  CC: Primary care physician; PROVIDER NOT IN SYSTEM

## 2016-10-19 NOTE — Discharge Instructions (Signed)
Pt will see PCP at [facility] and f/u with cardiology thru PCP

## 2016-10-19 NOTE — Care Management (Signed)
Informed that physical therapy recommended home health physical therapy and needed home health.  Recommendation is actually for outpatient.  Sent the physical therapy note to Norina Buzzard at [facility].  Updated patient and primary nurse.

## 2016-10-19 NOTE — Care Management (Signed)
Notified Norina Buzzard and informed of discharge.  Faxed the discharge summary

## 2016-10-28 ENCOUNTER — Emergency Department
Admission: EM | Admit: 2016-10-28 | Discharge: 2016-10-28 | Disposition: A | Discharge status: Other Acute Inpatient Hospital | Payer: Non-veteran care | Attending: Emergency Medicine | Admitting: Emergency Medicine

## 2016-10-28 ENCOUNTER — Ambulatory Visit (HOSPITAL_COMMUNITY)
Admission: AD | Admit: 2016-10-28 | Discharge: 2016-10-28 | Disposition: A | Discharge status: Home / Self Care | Payer: Non-veteran care | Source: Other Acute Inpatient Hospital | Attending: Emergency Medicine | Admitting: Emergency Medicine

## 2016-10-28 ENCOUNTER — Emergency Department: Payer: Non-veteran care

## 2016-10-28 ENCOUNTER — Encounter: Payer: Self-pay | Admitting: Emergency Medicine

## 2016-10-28 DIAGNOSIS — Z87891 Personal history of nicotine dependence: Secondary | ICD-10-CM | POA: Diagnosis not present

## 2016-10-28 DIAGNOSIS — R109 Unspecified abdominal pain: Secondary | ICD-10-CM | POA: Diagnosis not present

## 2016-10-28 DIAGNOSIS — J989 Respiratory disorder, unspecified: Secondary | ICD-10-CM | POA: Diagnosis present

## 2016-10-28 DIAGNOSIS — I10 Essential (primary) hypertension: Secondary | ICD-10-CM | POA: Diagnosis not present

## 2016-10-28 DIAGNOSIS — R0902 Hypoxemia: Secondary | ICD-10-CM | POA: Diagnosis not present

## 2016-10-28 DIAGNOSIS — R0602 Shortness of breath: Secondary | ICD-10-CM | POA: Insufficient documentation

## 2016-10-28 DIAGNOSIS — Z79899 Other long term (current) drug therapy: Secondary | ICD-10-CM | POA: Diagnosis not present

## 2016-10-28 LAB — TROPONIN I
TROPONIN I: 0.03 ng/mL — AB (ref <0.03)
Troponin I: 0.03 ng/mL (ref <0.03)

## 2016-10-28 LAB — CBC WITH DIFFERENTIAL/PLATELET
Basophils Absolute: 0 10*3/uL (ref 0–0.1)
Basophils Relative: 0 %
Eosinophils Absolute: 0 10*3/uL (ref 0–0.7)
Eosinophils Relative: 0 %
HEMATOCRIT: 44.8 % (ref 40.0–52.0)
HEMOGLOBIN: 15.7 g/dL (ref 13.0–18.0)
LYMPHS ABS: 0.9 10*3/uL — AB (ref 1.0–3.6)
LYMPHS PCT: 6 %
MCH: 35.6 pg — ABNORMAL HIGH (ref 26.0–34.0)
MCHC: 35 g/dL (ref 32.0–36.0)
MCV: 101.6 fL — AB (ref 80.0–100.0)
MONO ABS: 0.9 10*3/uL (ref 0.2–1.0)
MONOS PCT: 6 %
NEUTROS ABS: 13.3 10*3/uL — AB (ref 1.4–6.5)
NEUTROS PCT: 88 %
Platelets: 252 10*3/uL (ref 150–440)
RBC: 4.41 MIL/uL (ref 4.40–5.90)
RDW: 15.4 % — AB (ref 11.5–14.5)
WBC: 15.2 10*3/uL — ABNORMAL HIGH (ref 3.8–10.6)

## 2016-10-28 LAB — BASIC METABOLIC PANEL
Anion gap: 9 (ref 5–15)
BUN: 15 mg/dL (ref 6–20)
CALCIUM: 9.1 mg/dL (ref 8.9–10.3)
CHLORIDE: 95 mmol/L — AB (ref 101–111)
CO2: 30 mmol/L (ref 22–32)
CREATININE: 1.1 mg/dL (ref 0.61–1.24)
GFR calc Af Amer: >60 mL/min (ref >60)
GFR calc non Af Amer: >60 mL/min (ref >60)
GLUCOSE: 156 mg/dL — AB (ref 65–99)
Potassium: 3 mmol/L — ABNORMAL LOW (ref 3.5–5.1)
Sodium: 134 mmol/L — ABNORMAL LOW (ref 135–145)

## 2016-10-28 LAB — BRAIN NATRIURETIC PEPTIDE: B Natriuretic Peptide: 73 pg/mL (ref 0.0–100.0)

## 2016-10-28 MED ORDER — LEVOFLOXACIN IN D5W 750 MG/150ML IV SOLN
750.0000 mg | Freq: Once | INTRAVENOUS | Status: AC
Start: 2016-10-28 — End: 2016-10-28
  Administered 2016-10-28: 750 mg via INTRAVENOUS
  Filled 2016-10-28: qty 150

## 2016-10-28 MED ORDER — IPRATROPIUM-ALBUTEROL 0.5-2.5 (3) MG/3ML IN SOLN
3.0000 mL | Freq: Once | RESPIRATORY_TRACT | Status: AC
  Administered 2016-10-28: 3 mL via RESPIRATORY_TRACT
  Filled 2016-10-28: qty 3

## 2016-10-28 NOTE — ED Notes (Signed)
Pt taken to XR ay at this time.

## 2016-10-28 NOTE — ED Notes (Signed)
Report given to Allison, RN

## 2016-10-28 NOTE — ED Notes (Signed)
Pt noted to be 88% on RA, placed on 2L via [place] at this time.

## 2016-10-28 NOTE — ED Triage Notes (Addendum)
Per ACMES: Pt. To ED by [facility] from home, reporting cold sx x3 days, SOB, 125 solumedrol and 3 duonebs in route-rhonci, exp wheezes present bilaterally in route, original O2 sat 86%, 94% RA after tx, diminished lung sounds present. Denies hx COPD  cbg 230, 130/82, 106 HR, 98.6 oral temp

## 2016-10-28 NOTE — ED Notes (Signed)
Report given to transport service. ETA 20

## 2016-10-28 NOTE — ED Provider Notes (Signed)
[facility] Emergency Department Provider Note   ____________________________________________   I have reviewed the triage vital signs and the nursing notes.   HISTORY  Chief Complaint Shortness of Breath   History limited by: Not Limited   HPI Ryan Travis is a 75 y.o. male who presents to the emergency department today because of shortness of breath. Patient states that for the past 3 days he has been having a cold and congestion. Today he associated shortness of breath was getting worse. Patient was recently in the hospital for myocardial infarction had a stent placement. He patient denies any chest pain with this current episode. He denies any history of lung disease. Denies any history of COPD. No fevers.   Past Medical History:  Diagnosis Date  . Alcohol abuse   . Anxiety   . Arthritis   . 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 Cancer ([place])    multiple myeloma  . Depression   . Dysrhythmia   . Fall    3 weeks ago/ torn ligaments left knee/wearing brace/pt  . Hypertension   . Neuropathy ([place])   . Pain    chronic leg and ankle pain  . RLS (restless legs syndrome)     Patient Active Problem List   Diagnosis Date Noted  . Pressure injury of skin 10/18/2016  . 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 STEMI (ST elevation myocardial infarction) ([place]) 10/16/2016  . Myocardial infarct 10/16/2016    Past Surgical History:  Procedure Laterality Date  . BRAIN SURGERY     subdural hematoma  . CARDIAC CATHETERIZATION N/A 10/16/2016   Procedure: Left Heart Cath and Coronary Angiography;  Surgeon: Nelva Bush, MD;  Location: [place] [facility];  Service: Cardiovascular;  Laterality: N/A;  . CARDIAC CATHETERIZATION N/A 10/16/2016   Procedure: Coronary Stent Intervention;  Surgeon: Nelva Bush, MD;  Location: [place] [facility];  Service: Cardiovascular;  Laterality: N/A;  . CARDIAC CATHETERIZATION N/A 10/16/2016   Procedure: Temporary Pacemaker;  Surgeon: Nelva Bush, MD;  Location: [place] [facility];  Service: Cardiovascular;  Laterality: N/A;  . CATARACT EXTRACTION W/PHACO Right 08/16/2016   Procedure: CATARACT EXTRACTION PHACO AND INTRAOCULAR LENS PLACEMENT (IOC);  Surgeon: Eulogio Bear, MD;  Location: [facility];  Service: Ophthalmology;  Laterality: Right;  Lot # [id number] H [place]: 01:00.8 AP%:16.5 CDE: 10.02   . CATARACT EXTRACTION W/PHACO Left 10/04/2016   Procedure: CATARACT EXTRACTION PHACO AND INTRAOCULAR LENS PLACEMENT (IOC);  Surgeon: Eulogio Bear, MD;  Location: [facility] OR[facility];  Service: Ophthalmology;  Laterality: Left;  [place] 35.4 AP% 10.5 CDE 4.11 Fluid pack lot # [id number]  . EYE SURGERY    . FRACTURE SURGERY     multiple right ankle  . HERNIA REPAIR    . JOINT REPLACEMENT Bilateral    thr  . KNEE ARTHROSCOPY W/ ACL RECONSTRUCTION     left x 2  . rcr      Prior to Admission medications   Medication Sig Start Date End Date Taking? Authorizing Provider  AMLODIPINE BESYLATE PO Take 10 mg by mouth daily.    Historical Provider, MD  atorvastatin (LIPITOR) 80 MG tablet Take 1 tablet (80 mg total) by mouth daily at 6 PM. 10/19/16   Fritzi Mandes, MD  carbamide peroxide (DEBROX) 6.5 % otic solution Place 10 drops into both ears daily.    Historical Provider, MD  carvedilol (COREG) 12.5 MG tablet Take 12.5 mg by mouth 2 (two) times daily with a meal.  Historical Provider, MD  clopidogrel (PLAVIX) 75 MG tablet Take 1 tablet (75 mg total) by mouth daily. 10/20/16   Fritzi Mandes, MD  gabapentin (NEURONTIN) 300 MG capsule Take 300 mg by mouth 2 (two) times daily. 1 am 3 hs    Historical Provider, MD  HYDROcodone-acetaminophen (NORCO) 10-325 MG tablet Take 1 tablet by mouth.    Historical Provider, MD  lisinopril (PRINIVIL,ZESTRIL) 5 MG tablet Take 1 tablet (5 mg total) by mouth daily. 10/20/16   Fritzi Mandes, MD  sertraline (ZOLOFT) 100 MG tablet Take 200 mg by mouth daily.    Historical  Provider, MD  Testosterone Cypionate 200 MG/ML KIT Inject 1 mL into the muscle every 21 ( twenty-one) days.    Historical Provider, MD    Allergies Morphine and related; Neomycin; Other; and Penicillins  No family history on file.  Social History Social History  Substance Use Topics  . Smoking status: Former Smoker    Packs/day: 0.25    Types: Cigarettes  . Smokeless tobacco: Never Used  . Alcohol use No    Review of Systems  Constitutional: Negative for fever. Cardiovascular: Negative for chest pain. Respiratory: Positive for shortness of breath. Gastrointestinal: Negative for abdominal pain, vomiting and diarrhea. Genitourinary: Negative for dysuria. Musculoskeletal: Negative for back pain. Skin: Negative for rash. Neurological: Negative for headaches, focal weakness or numbness.  10-point ROS otherwise negative.  ____________________________________________   PHYSICAL EXAM:  VITAL SIGNS: ED Triage Vitals  Enc Vitals Group     BP 10/28/16 1733 116/89     Pulse --      Resp 10/28/16 1733 (!) 24     Temp 10/28/16 1733 98.7 F (37.1 C)     Temp Source 10/28/16 1733 Oral     SpO2 --      Weight 10/28/16 1734 190 lb (86.2 kg)     Height 10/28/16 1734 _0  (1.88 m)     Head Circumference --      Peak Flow --      Pain Score 10/28/16 1734 0   Constitutional: Alert and oriented. Well appearing and in no distress. Eyes: Conjunctivae are normal. Normal extraocular movements. ENT   Head: Normocephalic and atraumatic.   Nose: No congestion/rhinnorhea.   Mouth/Throat: Mucous membranes are moist.   Neck: No stridor. Hematological/Lymphatic/Immunilogical: No cervical lymphadenopathy. Cardiovascular: Normal rate, regular rhythm.  No murmurs, rubs, or gallops. Respiratory: Increased respiratory effort. Diffuse bilateral expiratory wheezing. Gastrointestinal: Soft and non tender. No rebound. No guarding.  Genitourinary: Deferred Musculoskeletal: Normal  range of motion in all extremities. No lower extremity edema. Neurologic:  Normal speech and language. No gross focal neurologic deficits are appreciated.  Skin:  Skin is warm, dry and intact. No rash noted. Psychiatric: Mood and affect are normal. Speech and behavior are normal. Patient exhibits appropriate insight and judgment.  ____________________________________________    LABS (pertinent positives/negatives)  Labs Reviewed  TROPONIN I - Abnormal; Notable for the following:       Result Value   Troponin I 0.03 (*)    All other components within normal limits  CBC WITH DIFFERENTIAL/PLATELET - Abnormal; Notable for the following:    WBC 15.2 (*)    MCV 101.6 (*)    MCH 35.6 (*)    RDW 15.4 (*)    Neutro Abs 13.3 (*)    Lymphs Abs 0.9 (*)    All other components within normal limits  BASIC METABOLIC PANEL - Abnormal; Notable for the following:    Sodium 134 (*)  Potassium 3.0 (*)    Chloride 95 (*)    Glucose, Bld 156 (*)    All other components within normal limits  BRAIN NATRIURETIC PEPTIDE   ____________________________________________   EKG  I, Nance Pear, attending physician, personally viewed and interpreted this EKG  EKG Time: 1742 Rate: 105 Rhythm: sinus tachycardia Axis: left axis deviation Intervals: qtc 455 QRS: LAFB ST changes: no st elevation Impression: abnormal ekg   ____________________________________________    RADIOLOGY  CXR IMPRESSION:  1. No acute thoracic findings.  2. Old healed bilateral rib fractures.  3. Atherosclerosis.  4. Degenerative arthropathy in both shoulders.      ___________________________________________   PROCEDURES  Procedures  ____________________________________________   INITIAL IMPRESSION / ASSESSMENT AND PLAN / ED COURSE  Pertinent labs & imaging results that were available during my care of the patient were reviewed by me and considered in my medical decision making (see chart for  details).  Patient presented to the emergency department today because of concerns for shortness of breath that is been going on for 3 days. Initial exam patient was in mild respiratory distress with diffuse bilateral wheezing. Patient was given Solu-Medrol by EMS. He is given further DuoNeb treatments here with some improvement. However the patient was hypoxic on room air at the mid to high 80s. Given this finding and recent history of MI thank you be best served by hospital  management. Patient is a [place] patient and he was accepted to the [place].  ____________________________________________   FINAL CLINICAL IMPRESSION(S) / ED DIAGNOSES  Final diagnoses:  Right flank pain  Shortness of breath  Hypoxia     Note: This dictation was prepared with Dragon dictation. Any transcriptional errors that result from this process are unintentional     Nance Pear, MD 10/28/16 2256

## 2018-06-17 ENCOUNTER — Emergency Department: Payer: Non-veteran care

## 2018-06-17 ENCOUNTER — Other Ambulatory Visit: Payer: Self-pay

## 2018-06-17 ENCOUNTER — Emergency Department
Admission: EM | Admit: 2018-06-17 | Discharge: 2018-06-17 | Disposition: A | Discharge status: Home / Self Care | Payer: No Typology Code available for payment source | Attending: Emergency Medicine | Admitting: Emergency Medicine

## 2018-06-17 ENCOUNTER — Emergency Department: Payer: No Typology Code available for payment source

## 2018-06-17 DIAGNOSIS — F419 Anxiety disorder, unspecified: Secondary | ICD-10-CM | POA: Insufficient documentation

## 2018-06-17 DIAGNOSIS — I252 Old myocardial infarction: Secondary | ICD-10-CM | POA: Insufficient documentation

## 2018-06-17 DIAGNOSIS — Z96643 Presence of artificial hip joint, bilateral: Secondary | ICD-10-CM | POA: Insufficient documentation

## 2018-06-17 DIAGNOSIS — Z8579 Personal history of other malignant neoplasms of lymphoid, hematopoietic and related tissues: Secondary | ICD-10-CM | POA: Insufficient documentation

## 2018-06-17 DIAGNOSIS — Z7902 Long term (current) use of antithrombotics/antiplatelets: Secondary | ICD-10-CM | POA: Insufficient documentation

## 2018-06-17 DIAGNOSIS — Z79899 Other long term (current) drug therapy: Secondary | ICD-10-CM | POA: Insufficient documentation

## 2018-06-17 DIAGNOSIS — Z87891 Personal history of nicotine dependence: Secondary | ICD-10-CM | POA: Insufficient documentation

## 2018-06-17 DIAGNOSIS — I10 Essential (primary) hypertension: Secondary | ICD-10-CM | POA: Insufficient documentation

## 2018-06-17 DIAGNOSIS — F329 Major depressive disorder, single episode, unspecified: Secondary | ICD-10-CM | POA: Insufficient documentation

## 2018-06-17 DIAGNOSIS — R42 Dizziness and giddiness: Secondary | ICD-10-CM | POA: Insufficient documentation

## 2018-06-17 LAB — CBC WITH DIFFERENTIAL/PLATELET
BASOS ABS: 0 10*3/uL (ref 0–0.1)
BASOS PCT: 1 %
Eosinophils Absolute: 0 10*3/uL (ref 0–0.7)
Eosinophils Relative: 0 %
HEMATOCRIT: 35.5 % — AB (ref 40.0–52.0)
HEMOGLOBIN: 12.3 g/dL — AB (ref 13.0–18.0)
LYMPHS PCT: 23 %
Lymphs Abs: 1 10*3/uL (ref 1.0–3.6)
MCH: 36.3 pg — ABNORMAL HIGH (ref 26.0–34.0)
MCHC: 34.7 g/dL (ref 32.0–36.0)
MCV: 104.7 fL — AB (ref 80.0–100.0)
Monocytes Absolute: 0.4 10*3/uL (ref 0.2–1.0)
Monocytes Relative: 9 %
NEUTROS PCT: 67 %
Neutro Abs: 3 10*3/uL (ref 1.4–6.5)
Platelets: 146 10*3/uL — ABNORMAL LOW (ref 150–440)
RBC: 3.39 MIL/uL — AB (ref 4.40–5.90)
RDW: 15.5 % — ABNORMAL HIGH (ref 11.5–14.5)
WBC: 4.4 10*3/uL (ref 3.8–10.6)

## 2018-06-17 LAB — TROPONIN I: Troponin I: <0.03 ng/mL (ref <0.03)

## 2018-06-17 LAB — BASIC METABOLIC PANEL
Anion gap: 11 (ref 5–15)
BUN: 18 mg/dL (ref 8–23)
CALCIUM: 8.2 mg/dL — AB (ref 8.9–10.3)
CO2: 21 mmol/L — ABNORMAL LOW (ref 22–32)
Chloride: 101 mmol/L (ref 98–111)
Creatinine, Ser: 1.1 mg/dL (ref 0.61–1.24)
Glucose, Bld: 114 mg/dL — ABNORMAL HIGH (ref 70–99)
POTASSIUM: 3.6 mmol/L (ref 3.5–5.1)
Sodium: 133 mmol/L — ABNORMAL LOW (ref 135–145)

## 2018-06-17 MED ORDER — SODIUM CHLORIDE 0.9 % IV BOLUS
500.0000 mL | Freq: Once | INTRAVENOUS | Status: AC
  Administered 2018-06-17: 500 mL via INTRAVENOUS

## 2018-06-17 NOTE — ED Notes (Signed)
Patient transported to CT 

## 2018-06-17 NOTE — ED Provider Notes (Signed)
[facility] Emergency Department Provider Note  ____________________________________________  Time seen: Approximately 3:58 PM  I have reviewed the triage vital signs and the nursing notes.   HISTORY  Chief Complaint Loss of Consciousness    HPI Ryan Travis is a 77 y.o. male with a history of hypertension, multiple myeloma who complains of dizziness this morning.  Wife is concerned that patient may have passed out but he denies.  No headache vision changes paresthesias or weakness.  No chest pain shortness of breath belly pain or back pain.  Otherwise in his usual state of health.  Dizziness was worse with standing.  Described as lightheadedness.  Intermittent.  EMS report initially a normal blood pressure, but after having the patient stand up his blood pressure dropped to 60/40.      Past Medical History:  Diagnosis Date  . Alcohol abuse   . Anxiety   . Arthritis   . 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 Cancer ([place])    multiple myeloma  . Depression   . Dysrhythmia   . Fall    3 weeks ago/ torn ligaments left knee/wearing brace/pt  . Hypertension   . Neuropathy   . Pain    chronic leg and ankle pain  . RLS (restless legs syndrome)      Patient Active Problem List   Diagnosis Date Noted  . Pressure injury of skin 10/18/2016  . STEMI involving right coronary artery ([place]) 10/16/2016  . STEMI (ST elevation myocardial infarction) ([place]) 10/16/2016  . Myocardial infarct ([place]) 10/16/2016     Past Surgical History:  Procedure Laterality Date  . BRAIN SURGERY     subdural hematoma  . CARDIAC CATHETERIZATION N/A 10/16/2016   Procedure: Left Heart Cath and Coronary Angiography;  Surgeon: Nelva Bush, MD;  Location: [place] [facility];  Service: Cardiovascular;  Laterality: N/A;  . CARDIAC CATHETERIZATION N/A 10/16/2016   Procedure: Coronary Stent Intervention;  Surgeon: Nelva Bush, MD;  Location: [place] [facility];  Service:  Cardiovascular;  Laterality: N/A;  . CARDIAC CATHETERIZATION N/A 10/16/2016   Procedure: Temporary Pacemaker;  Surgeon: Nelva Bush, MD;  Location: [place] [facility];  Service: Cardiovascular;  Laterality: N/A;  . CATARACT EXTRACTION W/PHACO Right 08/16/2016   Procedure: CATARACT EXTRACTION PHACO AND INTRAOCULAR LENS PLACEMENT (IOC);  Surgeon: Eulogio Bear, MD;  Location: [facility];  Service: Ophthalmology;  Laterality: Right;  Lot # [id number] H [place]: 01:00.8 AP%:16.5 CDE: 10.02   . CATARACT EXTRACTION W/PHACO Left 10/04/2016   Procedure: CATARACT EXTRACTION PHACO AND INTRAOCULAR LENS PLACEMENT (IOC);  Surgeon: Eulogio Bear, MD;  Location: [facility];  Service: Ophthalmology;  Laterality: Left;  [place] 35.4 AP% 10.5 CDE 4.11 Fluid pack lot # [id number]  . EYE SURGERY    . FRACTURE SURGERY     multiple right ankle  . HERNIA REPAIR    . JOINT REPLACEMENT Bilateral    thr  . KNEE ARTHROSCOPY W/ ACL RECONSTRUCTION     left x 2  . rcr       Prior to Admission medications   Medication Sig Start Date End Date Taking? Authorizing Provider  AMLODIPINE BESYLATE PO Take 10 mg by mouth daily.    [provider]  atorvastatin (LIPITOR) 80 MG tablet Take 1 tablet (80 mg total) by mouth daily at 6 PM. 10/19/16   Fritzi Mandes, MD  carbamide peroxide (DEBROX) 6.5 % otic solution Place 10 drops into both ears daily.    [provider]  carvedilol (COREG) 12.5 MG tablet Take 12.5 mg by mouth 2 (two) times daily with a meal.    [provider]  clopidogrel (PLAVIX) 75 MG tablet Take 1 tablet (75 mg total) by mouth daily. 10/20/16   Fritzi Mandes, MD  gabapentin (NEURONTIN) 300 MG capsule Take 300 mg by mouth 2 (two) times daily. 1 am 3 hs    [provider]  HYDROcodone-acetaminophen (NORCO) 10-325 MG tablet Take 1 tablet by mouth.    [provider]  lisinopril (PRINIVIL,ZESTRIL) 5 MG tablet Take 1 tablet (5 mg total) by mouth daily. 10/20/16   Fritzi Mandes, MD  sertraline (ZOLOFT) 100 MG tablet Take 200 mg by mouth daily.    [provider]  Testosterone Cypionate 200 MG/ML KIT Inject 1 mL into the muscle every 21 ( twenty-one) days.    [provider]     Allergies Morphine and related; Neomycin; Other; and Penicillins   History reviewed. No pertinent family history.  Social History Social History   Tobacco Use  . Smoking status: Former Smoker    Packs/day: 0.25    Types: Cigarettes  . Smokeless tobacco: Never Used  Substance Use Topics  . Alcohol use: No  . Drug use: No    Review of Systems  Constitutional:   No fever or chills.  Cardiovascular:   No chest pain or syncope. Respiratory:   No dyspnea or cough. Gastrointestinal:   Negative for abdominal pain, vomiting and diarrhea.  Musculoskeletal:   Negative for focal pain or swelling All other systems reviewed and are negative except as documented above in ROS and HPI.  ____________________________________________   PHYSICAL EXAM:  VITAL SIGNS: ED Triage Vitals  Enc Vitals Group     BP 06/17/18 1349 (!) 80/61     Pulse Rate 06/17/18 1349 89     Resp 06/17/18 1349 18     Temp 06/17/18 1349 98 F (36.7 C)     Temp Source 06/17/18 1349 Oral     SpO2 06/17/18 1344 96 %     Weight 06/17/18 1351 173 lb (78.5 kg)     Height 06/17/18 1351 '6\' 2"'  (1.88 m)     Head Circumference --      Peak Flow --      Pain Score 06/17/18 1351 0     Pain Loc --      Pain Edu? --      Excl. in [place]? --     Vital signs reviewed, nursing assessments reviewed.   Constitutional:   Alert and oriented. Non-toxic appearance. Eyes:   Conjunctivae are normal. EOMI. PERRL. ENT      Head:   Normocephalic and atraumatic.      Nose:   No congestion/rhinnorhea.       Mouth/Throat:   Dry Mucous membranes, no pharyngeal erythema. No peritonsillar mass.       Neck:   No meningismus. Full ROM. Hematological/Lymphatic/Immunilogical:   No cervical  lymphadenopathy. Cardiovascular:   RRR. Symmetric bilateral radial and DP pulses.  No murmurs. Cap refill less than 2 seconds. Respiratory:   Normal respiratory effort without tachypnea/retractions. Breath sounds are clear and equal bilaterally. No wheezes/rales/rhonchi. Gastrointestinal:   Soft and nontender. Non distended. There is no CVA tenderness.  No rebound, rigidity, or guarding. Musculoskeletal:   Normal range of motion in all extremities. No joint effusions.  No lower extremity tenderness.  No edema. Neurologic:   Normal speech and language.  Motor grossly intact. No acute focal neurologic  deficits are appreciated.  Skin:    Skin is warm, dry and intact. No rash noted.  No petechiae, purpura, or bullae.  ____________________________________________    LABS (pertinent positives/negatives) (all labs ordered are listed, but only abnormal results are displayed) Labs Reviewed  BASIC METABOLIC PANEL - Abnormal; Notable for the following components:      Result Value   Sodium 133 (*)    CO2 21 (*)    Glucose, Bld 114 (*)    Calcium 8.2 (*)    All other components within normal limits  CBC WITH DIFFERENTIAL/PLATELET - Abnormal; Notable for the following components:   RBC 3.39 (*)    Hemoglobin 12.3 (*)    HCT 35.5 (*)    MCV 104.7 (*)    MCH 36.3 (*)    RDW 15.5 (*)    Platelets 146 (*)    All other components within normal limits  URINE CULTURE  TROPONIN I  URINALYSIS, COMPLETE (UACMP) WITH MICROSCOPIC   ____________________________________________   EKG  Interpreted by me Sinus rhythm rate of 89, left axis, normal intervals.  Normal QRS ST segments and T waves.  ____________________________________________    RADIOLOGY  Ct Head Wo Contrast  Result Date: 06/17/2018 CLINICAL DATA:  Initial evaluation for acute weakness, dizziness, syncope. History of multiple myeloma and subdural hematoma. EXAM: CT HEAD WITHOUT CONTRAST TECHNIQUE: Contiguous axial images were  obtained from the base of the skull through the vertex without intravenous contrast. COMPARISON:  Prior CT from 10/16/2016. FINDINGS: Brain: Mild age-related cerebral atrophy with chronic small vessel ischemic disease. Chronic right PICA infarct noted. No acute intracranial hemorrhage. No acute large vessel territory infarct. No mass lesion, midline shift or mass effect. No hydrocephalus. No extra-axial fluid collection. Vascular: No asymmetric hyperdense vessel. Scattered vascular calcifications noted within the carotid siphons. Skull: Scalp soft tissues demonstrate no acute abnormality. Remote left burr hole craniotomy noted, presumably for subdural evacuation. Sinuses/Orbits: Globes and orbital soft tissues demonstrate no acute finding. Paranasal sinuses and mastoid air cells are clear. Other: None. IMPRESSION: 1. No acute intracranial abnormality. 2. Chronic right PICA territory infarct. 3. Mild age-related cerebral atrophy with chronic small vessel ischemic disease. Electronically Signed   By: Jeannine Boga M.D.   On: 06/17/2018 14:14   Dg Chest Portable 1 View  Result Date: 06/17/2018 CLINICAL DATA:  Dizziness, syncope EXAM: PORTABLE CHEST 1 VIEW COMPARISON:  10/28/2016 FINDINGS: Left axillary surgical clips. No acute opacity or effusion. Normal cardiomediastinal silhouette with aortic atherosclerosis. No pneumothorax. Chronic right upper rib deformities. Chronic deformity of the distal right clavicle and AC joint. Old appearing bilateral rib fractures. IMPRESSION: No active disease. Electronically Signed   By: Donavan Foil M.D.   On: 06/17/2018 14:23    ____________________________________________   PROCEDURES Procedures  ____________________________________________    CLINICAL IMPRESSION / ASSESSMENT AND PLAN / ED COURSE  Pertinent labs & imaging results that were available during my care of the patient were reviewed by me and considered in my medical decision making (see chart for  details).    Patient brought to the ED due to an episode of dizziness and low blood pressure at home.  No prodromal symptoms, currently no symptoms.  Patient does report he has not been drinking a lot of water recently.  No heat exposure, denies any chest pain shortness of breath belly pain fevers or chills.  After 500 mL's of saline from EMS, blood pressure normalized.  On arrival patient has normal blood pressure and vital signs and is  asymptomatic.  Due to his age and comorbidities, check labs, CT scan of the head, chest x-ray.  Give IV fluids.  Clinical Course as of Jun 17 1557  Tue Jun 17, 2018  1521 Orthostatics show significant change in systolic blood pressure without hypotension.  I will give him further IV fluids for hydration after which patient should be stable for discharge home.   [PS]    Clinical Course User Index [PS] Carrie Mew, MD     ____________________________________________   FINAL CLINICAL IMPRESSION(S) / ED DIAGNOSES    Final diagnoses:  [place]     ED Discharge Orders    None      Portions of this note were generated with dragon dictation software. Dictation errors may occur despite best attempts at proofreading.    Carrie Mew, MD 06/17/18 [phone]

## 2018-06-17 NOTE — ED Triage Notes (Signed)
Pt arrives via ems from home with reports of dizziness and passing out. EMS states pt bp on 120/90, then dropped to 64/43. Ems administered 500 NS bolus in route. Pt alert and oriented on arrival. Pt denies any pain and states dizziness has resolved.NAD at this time

## 2019-01-08 ENCOUNTER — Emergency Department: Payer: Medicare Other

## 2019-01-08 ENCOUNTER — Other Ambulatory Visit: Payer: Self-pay

## 2019-01-08 ENCOUNTER — Inpatient Hospital Stay
Admission: EM | Admit: 2019-01-08 | Discharge: 2019-01-12 | DRG: 872 | Disposition: A | Discharge status: Home / Self Care | Payer: Medicare Other | Attending: Internal Medicine | Admitting: Internal Medicine

## 2019-01-08 ENCOUNTER — Encounter: Payer: Self-pay | Admitting: *Deleted

## 2019-01-08 DIAGNOSIS — Z880 Allergy status to penicillin: Secondary | ICD-10-CM | POA: Diagnosis not present

## 2019-01-08 DIAGNOSIS — R1310 Dysphagia, unspecified: Secondary | ICD-10-CM | POA: Diagnosis present

## 2019-01-08 DIAGNOSIS — Z955 Presence of coronary angioplasty implant and graft: Secondary | ICD-10-CM

## 2019-01-08 DIAGNOSIS — F17210 Nicotine dependence, cigarettes, uncomplicated: Secondary | ICD-10-CM | POA: Diagnosis present

## 2019-01-08 DIAGNOSIS — Z885 Allergy status to narcotic agent status: Secondary | ICD-10-CM

## 2019-01-08 DIAGNOSIS — G2581 Restless legs syndrome: Secondary | ICD-10-CM | POA: Diagnosis present

## 2019-01-08 DIAGNOSIS — Y908 Blood alcohol level of 240 mg/100 ml or more: Secondary | ICD-10-CM | POA: Diagnosis present

## 2019-01-08 DIAGNOSIS — Z7902 Long term (current) use of antithrombotics/antiplatelets: Secondary | ICD-10-CM

## 2019-01-08 DIAGNOSIS — D696 Thrombocytopenia, unspecified: Secondary | ICD-10-CM | POA: Diagnosis present

## 2019-01-08 DIAGNOSIS — E8729 Other acidosis: Secondary | ICD-10-CM | POA: Diagnosis present

## 2019-01-08 DIAGNOSIS — F10239 Alcohol dependence with withdrawal, unspecified: Secondary | ICD-10-CM | POA: Diagnosis present

## 2019-01-08 DIAGNOSIS — N3001 Acute cystitis with hematuria: Secondary | ICD-10-CM | POA: Diagnosis present

## 2019-01-08 DIAGNOSIS — E872 Acidosis: Secondary | ICD-10-CM | POA: Diagnosis present

## 2019-01-08 DIAGNOSIS — N390 Urinary tract infection, site not specified: Secondary | ICD-10-CM | POA: Diagnosis not present

## 2019-01-08 DIAGNOSIS — E876 Hypokalemia: Secondary | ICD-10-CM | POA: Diagnosis present

## 2019-01-08 DIAGNOSIS — Z96643 Presence of artificial hip joint, bilateral: Secondary | ICD-10-CM | POA: Diagnosis present

## 2019-01-08 DIAGNOSIS — Z961 Presence of intraocular lens: Secondary | ICD-10-CM | POA: Diagnosis present

## 2019-01-08 DIAGNOSIS — F329 Major depressive disorder, single episode, unspecified: Secondary | ICD-10-CM | POA: Diagnosis present

## 2019-01-08 DIAGNOSIS — F10230 Alcohol dependence with withdrawal, uncomplicated: Secondary | ICD-10-CM | POA: Diagnosis not present

## 2019-01-08 DIAGNOSIS — G629 Polyneuropathy, unspecified: Secondary | ICD-10-CM | POA: Diagnosis present

## 2019-01-08 DIAGNOSIS — F10920 Alcohol use, unspecified with intoxication, uncomplicated: Secondary | ICD-10-CM

## 2019-01-08 DIAGNOSIS — Z79891 Long term (current) use of opiate analgesic: Secondary | ICD-10-CM

## 2019-01-08 DIAGNOSIS — C9001 Multiple myeloma in remission: Secondary | ICD-10-CM | POA: Diagnosis present

## 2019-01-08 DIAGNOSIS — I2510 Atherosclerotic heart disease of native coronary artery without angina pectoris: Secondary | ICD-10-CM | POA: Diagnosis present

## 2019-01-08 DIAGNOSIS — F10221 Alcohol dependence with intoxication delirium: Secondary | ICD-10-CM | POA: Diagnosis present

## 2019-01-08 DIAGNOSIS — Z9841 Cataract extraction status, right eye: Secondary | ICD-10-CM

## 2019-01-08 DIAGNOSIS — Z881 Allergy status to other antibiotic agents status: Secondary | ICD-10-CM

## 2019-01-08 DIAGNOSIS — I248 Other forms of acute ischemic heart disease: Secondary | ICD-10-CM | POA: Diagnosis present

## 2019-01-08 DIAGNOSIS — Z8249 Family history of ischemic heart disease and other diseases of the circulatory system: Secondary | ICD-10-CM

## 2019-01-08 DIAGNOSIS — I10 Essential (primary) hypertension: Secondary | ICD-10-CM | POA: Diagnosis present

## 2019-01-08 DIAGNOSIS — B9620 Unspecified Escherichia coli [E. coli] as the cause of diseases classified elsewhere: Secondary | ICD-10-CM | POA: Diagnosis not present

## 2019-01-08 DIAGNOSIS — I252 Old myocardial infarction: Secondary | ICD-10-CM

## 2019-01-08 DIAGNOSIS — F10229 Alcohol dependence with intoxication, unspecified: Secondary | ICD-10-CM | POA: Diagnosis present

## 2019-01-08 DIAGNOSIS — F419 Anxiety disorder, unspecified: Secondary | ICD-10-CM | POA: Diagnosis present

## 2019-01-08 DIAGNOSIS — Z888 Allergy status to other drugs, medicaments and biological substances status: Secondary | ICD-10-CM

## 2019-01-08 DIAGNOSIS — F1290 Cannabis use, unspecified, uncomplicated: Secondary | ICD-10-CM | POA: Diagnosis present

## 2019-01-08 DIAGNOSIS — R059 Cough, unspecified: Secondary | ICD-10-CM

## 2019-01-08 DIAGNOSIS — R0603 Acute respiratory distress: Secondary | ICD-10-CM | POA: Diagnosis present

## 2019-01-08 DIAGNOSIS — R05 Cough: Secondary | ICD-10-CM | POA: Diagnosis not present

## 2019-01-08 DIAGNOSIS — Z794 Long term (current) use of insulin: Secondary | ICD-10-CM

## 2019-01-08 DIAGNOSIS — Z9489 Other transplanted organ and tissue status: Secondary | ICD-10-CM

## 2019-01-08 DIAGNOSIS — M1990 Unspecified osteoarthritis, unspecified site: Secondary | ICD-10-CM | POA: Diagnosis present

## 2019-01-08 DIAGNOSIS — A4151 Sepsis due to Escherichia coli [E. coli]: Secondary | ICD-10-CM | POA: Diagnosis present

## 2019-01-08 DIAGNOSIS — Z9842 Cataract extraction status, left eye: Secondary | ICD-10-CM

## 2019-01-08 DIAGNOSIS — Z79899 Other long term (current) drug therapy: Secondary | ICD-10-CM

## 2019-01-08 LAB — CBC WITH DIFFERENTIAL/PLATELET
ABS IMMATURE GRANULOCYTES: 0.04 10*3/uL (ref 0.00–0.07)
Basophils Absolute: 0 10*3/uL (ref 0.0–0.1)
Basophils Relative: 0 %
Eosinophils Absolute: 0 10*3/uL (ref 0.0–0.5)
Eosinophils Relative: 0 %
HEMATOCRIT: 39.5 % (ref 39.0–52.0)
HEMOGLOBIN: 13.6 g/dL (ref 13.0–17.0)
IMMATURE GRANULOCYTES: 0 %
LYMPHS PCT: 20 %
Lymphs Abs: 1.9 10*3/uL (ref 0.7–4.0)
MCH: 36.4 pg — AB (ref 26.0–34.0)
MCHC: 34.4 g/dL (ref 30.0–36.0)
MCV: 105.6 fL — AB (ref 80.0–100.0)
MONO ABS: 0.7 10*3/uL (ref 0.1–1.0)
Monocytes Relative: 7 %
NEUTROS ABS: 6.6 10*3/uL (ref 1.7–7.7)
Neutrophils Relative %: 73 %
PLATELETS: 41 10*3/uL — AB (ref 150–400)
RBC: 3.74 MIL/uL — ABNORMAL LOW (ref 4.22–5.81)
RDW: 15.9 % — ABNORMAL HIGH (ref 11.5–15.5)
WBC: 9.3 10*3/uL (ref 4.0–10.5)
nRBC: 0 % (ref 0.0–0.2)

## 2019-01-08 LAB — URINALYSIS, COMPLETE (UACMP) WITH MICROSCOPIC
Bilirubin Urine: NEGATIVE
Glucose, UA: NEGATIVE mg/dL
Ketones, ur: 20 mg/dL — AB
NITRITE: POSITIVE — AB
PH: 5 (ref 5.0–8.0)
Protein, ur: 30 mg/dL — AB
Specific Gravity, Urine: 1.02 (ref 1.005–1.030)

## 2019-01-08 LAB — LIPID PANEL
CHOLESTEROL: 179 mg/dL (ref 0–200)
HDL: 119 mg/dL (ref >40)
LDL Cholesterol: 46 mg/dL (ref 0–99)
Total CHOL/HDL Ratio: 1.5 RATIO
Triglycerides: 68 mg/dL (ref <150)
VLDL: 14 mg/dL (ref 0–40)

## 2019-01-08 LAB — COMPREHENSIVE METABOLIC PANEL
ALT: 28 U/L (ref 0–44)
AST: 74 U/L — ABNORMAL HIGH (ref 15–41)
Albumin: 3.9 g/dL (ref 3.5–5.0)
Alkaline Phosphatase: 79 U/L (ref 38–126)
Anion gap: 19 — ABNORMAL HIGH (ref 5–15)
BUN: 21 mg/dL (ref 8–23)
CO2: 21 mmol/L — ABNORMAL LOW (ref 22–32)
CREATININE: 0.8 mg/dL (ref 0.61–1.24)
Calcium: 8.1 mg/dL — ABNORMAL LOW (ref 8.9–10.3)
Chloride: 103 mmol/L (ref 98–111)
GFR calc Af Amer: >60 mL/min (ref >60)
GFR calc non Af Amer: >60 mL/min (ref >60)
Glucose, Bld: 58 mg/dL — ABNORMAL LOW (ref 70–99)
Potassium: 4.1 mmol/L (ref 3.5–5.1)
Sodium: 143 mmol/L (ref 135–145)
Total Bilirubin: 1.3 mg/dL — ABNORMAL HIGH (ref 0.3–1.2)
Total Protein: 7.4 g/dL (ref 6.5–8.1)

## 2019-01-08 LAB — BASIC METABOLIC PANEL
Anion gap: 16 — ABNORMAL HIGH (ref 5–15)
Anion gap: 15 (ref 5–15)
Anion gap: 9 (ref 5–15)
BUN: 19 mg/dL (ref 8–23)
BUN: 17 mg/dL (ref 8–23)
BUN: 15 mg/dL (ref 8–23)
CO2: 21 mmol/L — ABNORMAL LOW (ref 22–32)
CO2: 21 mmol/L — ABNORMAL LOW (ref 22–32)
CO2: 25 mmol/L (ref 22–32)
Calcium: 7.7 mg/dL — ABNORMAL LOW (ref 8.9–10.3)
Calcium: 7.8 mg/dL — ABNORMAL LOW (ref 8.9–10.3)
Calcium: 7.5 mg/dL — ABNORMAL LOW (ref 8.9–10.3)
Chloride: 100 mmol/L (ref 98–111)
Chloride: 100 mmol/L (ref 98–111)
Chloride: 101 mmol/L (ref 98–111)
Creatinine, Ser: 0.62 mg/dL (ref 0.61–1.24)
Creatinine, Ser: 0.6 mg/dL — ABNORMAL LOW (ref 0.61–1.24)
Creatinine, Ser: 0.67 mg/dL (ref 0.61–1.24)
GFR calc Af Amer: >60 mL/min (ref >60)
GFR calc Af Amer: >60 mL/min (ref >60)
GFR calc Af Amer: >60 mL/min (ref >60)
GFR calc non Af Amer: >60 mL/min (ref >60)
GFR calc non Af Amer: >60 mL/min (ref >60)
GFR calc non Af Amer: >60 mL/min (ref >60)
Glucose, Bld: 100 mg/dL — ABNORMAL HIGH (ref 70–99)
Glucose, Bld: 106 mg/dL — ABNORMAL HIGH (ref 70–99)
Glucose, Bld: 138 mg/dL — ABNORMAL HIGH (ref 70–99)
Potassium: 3.8 mmol/L (ref 3.5–5.1)
Potassium: 3.7 mmol/L (ref 3.5–5.1)
Potassium: 3.5 mmol/L (ref 3.5–5.1)
Sodium: 137 mmol/L (ref 135–145)
Sodium: 136 mmol/L (ref 135–145)
Sodium: 135 mmol/L (ref 135–145)

## 2019-01-08 LAB — ETHANOL: Alcohol, Ethyl (B): 268 mg/dL — ABNORMAL HIGH (ref <10)

## 2019-01-08 LAB — TROPONIN I
Troponin I: 0.04 ng/mL (ref <0.03)
Troponin I: 0.03 ng/mL (ref <0.03)
Troponin I: 0.03 ng/mL (ref <0.03)
Troponin I: 0.03 ng/mL (ref <0.03)

## 2019-01-08 LAB — GLUCOSE, CAPILLARY
Glucose-Capillary: 143 mg/dL — ABNORMAL HIGH (ref 70–99)
Glucose-Capillary: 80 mg/dL (ref 70–99)
Glucose-Capillary: 140 mg/dL — ABNORMAL HIGH (ref 70–99)

## 2019-01-08 LAB — MAGNESIUM: Magnesium: 1.5 mg/dL — ABNORMAL LOW (ref 1.7–2.4)

## 2019-01-08 LAB — LACTIC ACID, PLASMA
Lactic Acid, Venous: 5.2 mmol/L (ref 0.5–1.9)
Lactic Acid, Venous: 5.1 mmol/L (ref 0.5–1.9)
Lactic Acid, Venous: 4.7 mmol/L (ref 0.5–1.9)
Lactic Acid, Venous: 3.8 mmol/L (ref 0.5–1.9)
Lactic Acid, Venous: 1.5 mmol/L (ref 0.5–1.9)

## 2019-01-08 LAB — HEMOGLOBIN A1C
Hgb A1c MFr Bld: 4.9 % (ref 4.8–5.6)
Mean Plasma Glucose: 93.93 mg/dL

## 2019-01-08 LAB — MRSA PCR SCREENING: MRSA by PCR: NEGATIVE

## 2019-01-08 LAB — PHOSPHORUS: Phosphorus: 1.5 mg/dL — ABNORMAL LOW (ref 2.5–4.6)

## 2019-01-08 MED ORDER — ADULT MULTIVITAMIN W/MINERALS CH
1.0000 | ORAL_TABLET | Freq: Every day | ORAL | Status: DC
  Administered 2019-01-08 – 2019-01-12 (×5): 1 via ORAL
  Filled 2019-01-08 (×5): qty 1

## 2019-01-08 MED ORDER — ATORVASTATIN CALCIUM 20 MG PO TABS
80.0000 mg | ORAL_TABLET | Freq: Every day | ORAL | Status: DC
  Administered 2019-01-10 – 2019-01-11 (×2): 80 mg via ORAL
  Filled 2019-01-08 (×3): qty 4

## 2019-01-08 MED ORDER — LORAZEPAM 1 MG PO TABS
1.0000 mg | ORAL_TABLET | Freq: Four times a day (QID) | ORAL | Status: DC | PRN
  Administered 2019-01-08: 1 mg via ORAL
  Filled 2019-01-08: qty 1

## 2019-01-08 MED ORDER — SODIUM CHLORIDE 0.9 % IV SOLN: INTRAVENOUS | Status: DC

## 2019-01-08 MED ORDER — NICOTINE 21 MG/24HR TD PT24
21.0000 mg | MEDICATED_PATCH | Freq: Every day | TRANSDERMAL | Status: DC
  Administered 2019-01-08 – 2019-01-12 (×5): 21 mg via TRANSDERMAL
  Filled 2019-01-08 (×5): qty 1

## 2019-01-08 MED ORDER — CARVEDILOL 3.125 MG PO TABS
12.5000 mg | ORAL_TABLET | Freq: Two times a day (BID) | ORAL | Status: DC
  Administered 2019-01-08 – 2019-01-12 (×6): 12.5 mg via ORAL
  Filled 2019-01-08 (×5): qty 4
  Filled 2019-01-08: qty 1

## 2019-01-08 MED ORDER — INSULIN REGULAR(HUMAN) IN NACL 100-0.9 UT/100ML-% IV SOLN: INTRAVENOUS | Status: DC

## 2019-01-08 MED ORDER — GABAPENTIN 300 MG PO CAPS
300.0000 mg | ORAL_CAPSULE | Freq: Two times a day (BID) | ORAL | Status: DC
  Administered 2019-01-08 – 2019-01-12 (×9): 300 mg via ORAL
  Filled 2019-01-08: qty 1
  Filled 2019-01-08: qty 3
  Filled 2019-01-08 (×2): qty 1
  Filled 2019-01-08: qty 3
  Filled 2019-01-08 (×4): qty 1

## 2019-01-08 MED ORDER — DEXTROSE 50 % IV SOLN
1.0000 | Freq: Once | INTRAVENOUS | Status: AC
  Administered 2019-01-08: 50 mL via INTRAVENOUS
  Filled 2019-01-08: qty 50

## 2019-01-08 MED ORDER — AMLODIPINE BESYLATE 10 MG PO TABS
10.0000 mg | ORAL_TABLET | Freq: Every day | ORAL | Status: DC
  Administered 2019-01-08 – 2019-01-12 (×4): 10 mg via ORAL
  Filled 2019-01-08 (×4): qty 1

## 2019-01-08 MED ORDER — CLOPIDOGREL BISULFATE 75 MG PO TABS
75.0000 mg | ORAL_TABLET | Freq: Every day | ORAL | Status: DC
  Administered 2019-01-08 – 2019-01-12 (×5): 75 mg via ORAL
  Filled 2019-01-08 (×5): qty 1

## 2019-01-08 MED ORDER — INSULIN ASPART 100 UNIT/ML ~~LOC~~ SOLN
0.0000 [IU] | Freq: Three times a day (TID) | SUBCUTANEOUS | Status: DC
  Administered 2019-01-09: 2 [IU] via SUBCUTANEOUS
  Administered 2019-01-10 – 2019-01-11 (×2): 1 [IU] via SUBCUTANEOUS
  Filled 2019-01-08 (×3): qty 1

## 2019-01-08 MED ORDER — SODIUM CHLORIDE 0.9 % IV SOLN
1.0000 g | Freq: Once | INTRAVENOUS | Status: AC
  Administered 2019-01-08: 1 g via INTRAVENOUS
  Filled 2019-01-08: qty 10

## 2019-01-08 MED ORDER — IPRATROPIUM-ALBUTEROL 0.5-2.5 (3) MG/3ML IN SOLN
3.0000 mL | Freq: Once | RESPIRATORY_TRACT | Status: AC
  Administered 2019-01-08: 3 mL via RESPIRATORY_TRACT
  Filled 2019-01-08: qty 3

## 2019-01-08 MED ORDER — SERTRALINE HCL 50 MG PO TABS
200.0000 mg | ORAL_TABLET | Freq: Every day | ORAL | Status: DC
  Administered 2019-01-08 – 2019-01-12 (×5): 200 mg via ORAL
  Filled 2019-01-08 (×5): qty 4

## 2019-01-08 MED ORDER — CEPHALEXIN 500 MG PO CAPS
500.0000 mg | ORAL_CAPSULE | Freq: Two times a day (BID) | ORAL | Status: DC
  Administered 2019-01-08 – 2019-01-09 (×4): 500 mg via ORAL
  Filled 2019-01-08 (×6): qty 1

## 2019-01-08 MED ORDER — LORAZEPAM 2 MG/ML IJ SOLN: 1.0000 mg | Freq: Four times a day (QID) | INTRAMUSCULAR | Status: DC | PRN

## 2019-01-08 MED ORDER — POTASSIUM CHLORIDE 10 MEQ/100ML IV SOLN
10.0000 meq | INTRAVENOUS | Status: DC
  Filled 2019-01-08 (×2): qty 100

## 2019-01-08 MED ORDER — DEXTROSE-NACL 5-0.45 % IV SOLN
INTRAVENOUS | Status: DC
  Administered 2019-01-08 (×2): via INTRAVENOUS

## 2019-01-08 MED ORDER — SODIUM CHLORIDE 0.9 % IV BOLUS
1000.0000 mL | Freq: Once | INTRAVENOUS | Status: AC
  Administered 2019-01-08: 1000 mL via INTRAVENOUS

## 2019-01-08 MED ORDER — ORAL CARE MOUTH RINSE
15.0000 mL | Freq: Two times a day (BID) | OROMUCOSAL | Status: DC
  Administered 2019-01-08 – 2019-01-12 (×3): 15 mL via OROMUCOSAL

## 2019-01-08 MED ORDER — VITAMIN B-6 50 MG PO TABS
100.0000 mg | ORAL_TABLET | Freq: Every day | ORAL | Status: DC
  Administered 2019-01-08 – 2019-01-12 (×5): 100 mg via ORAL
  Filled 2019-01-08 (×5): qty 2

## 2019-01-08 MED ORDER — VITAMIN B-1 100 MG PO TABS
100.0000 mg | ORAL_TABLET | Freq: Every day | ORAL | Status: DC
  Administered 2019-01-08 – 2019-01-12 (×5): 100 mg via ORAL
  Filled 2019-01-08 (×5): qty 1

## 2019-01-08 MED ORDER — FOLIC ACID 1 MG PO TABS
1.0000 mg | ORAL_TABLET | Freq: Every day | ORAL | Status: DC
  Administered 2019-01-08 – 2019-01-12 (×5): 1 mg via ORAL
  Filled 2019-01-08 (×5): qty 1

## 2019-01-08 MED ORDER — INSULIN ASPART 100 UNIT/ML ~~LOC~~ SOLN: 0.0000 [IU] | Freq: Every day | SUBCUTANEOUS | Status: DC

## 2019-01-08 NOTE — Progress Notes (Signed)
Inpatient Diabetes Program Recommendations  AACE/ADA: New Consensus Statement on Inpatient Glycemic Control (2015)  Target Ranges:  Prepandial:   less than 140 mg/dL      Peak postprandial:   less than 180 mg/dL (1-2 hours)      Critically ill patients:  140 - 180 mg/dL   Lab Results  Component Value Date   GLUCAP 143 (H) 01/08/2019   HGBA1C 5.3 10/17/2016    Review of Glycemic Control Results for AREEB, CORRON (MRN [id number]) as of 01/08/2019 10:36  Ref. Range 01/08/2019 05:11  Glucose Latest Ref Range: 70 - 99 mg/dL 58 (L)   Diabetes history: None Outpatient Diabetes medications: None Current orders for Inpatient glycemic control: DKA order set/IV insulin Inpatient Diabetes Program Recommendations:    Note that patient admitted for Alcoholic ketoacidosis.  Admission glucose=58 mg/dL.    Insulin drip not started due to low blood sugars.  Do not recommend DKA order set for this patient. Called and discussed with RN and discussed possibility of d/c of DKA order set.  Recommended that he discuss with ICU MD and pharmacist.    Thanks,  Adah Perl, RN, BC-ADM Inpatient Diabetes Coordinator Pager [phone]

## 2019-01-08 NOTE — ED Triage Notes (Signed)
Per EMS pt was seen yesterday morning by them and received a breathing treatment but didn't want to go to the hospital. Today they were called out because pt just feels horrible "like he could just die" VSS. Alert and Oriented but EMS states he seems confused. Pt answers all questions appropriately

## 2019-01-08 NOTE — Plan of Care (Signed)
Pt transferred from ICU.  VSS. A&Ox4.  CIWA score 4, no intervention needed.  Pt has difficulty swallowing, Dr Brett Albino made aware. Now NPO and SLP consult pending.

## 2019-01-08 NOTE — Plan of Care (Signed)

## 2019-01-08 NOTE — ED Provider Notes (Signed)
[facility] Emergency Department Provider Note  ____________________________________________  Time seen: Approximately 5:19 AM  I have reviewed the triage vital signs and the nursing notes.   HISTORY  Chief Complaint Respiratory Distress  Level 5 caveat:  Portions of the history and physical were unable to be obtained due to intoxication   HPI Ryan Travis is a 78 y.o. male history of alcohol abuse, multiple myeloma, hypertension, CAD status post STEMI who presents for evaluation of weakness.  Patient reports that yesterday morning his wife called 911 because he was having trouble breathing.  After receiving 1 DuoNeb patient refused transfer to the hospital.  This morning patient called 911 because he felt "like he was going to die".  He reports that the sensation that he was going to die started after he drank a pint of vodka.  He denies any changes on his chronic cough or shortness of breath.  No chest pain, no abdominal pain.  Patient does report nausea and vomiting however he reports that the symptoms happen daily for more than 6 years.  No fever or chills, no dysuria or hematuria.   Past Medical History:  Diagnosis Date  . Alcohol abuse   . Anxiety   . Arthritis   . 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 Cancer ([place])    multiple myeloma  . Depression   . Dysrhythmia   . Fall    3 weeks ago/ torn ligaments left knee/wearing brace/pt  . Hypertension   . Neuropathy   . Pain    chronic leg and ankle pain  . RLS (restless legs syndrome)     Patient Active Problem List   Diagnosis Date Noted  . Pressure injury of skin 10/18/2016  . STEMI involving right coronary artery ([place]) 10/16/2016  . STEMI (ST elevation myocardial infarction) ([place]) 10/16/2016  . Myocardial infarct ([place]) 10/16/2016    Past Surgical History:  Procedure Laterality Date  . BRAIN SURGERY     subdural hematoma  . CARDIAC CATHETERIZATION N/A 10/16/2016   Procedure: Left Heart  Cath and Coronary Angiography;  Surgeon: Nelva Bush, MD;  Location: [place] [facility];  Service: Cardiovascular;  Laterality: N/A;  . CARDIAC CATHETERIZATION N/A 10/16/2016   Procedure: Coronary Stent Intervention;  Surgeon: Nelva Bush, MD;  Location: [place] [facility];  Service: Cardiovascular;  Laterality: N/A;  . CARDIAC CATHETERIZATION N/A 10/16/2016   Procedure: Temporary Pacemaker;  Surgeon: Nelva Bush, MD;  Location: [place] [facility] LAB;  Service: Cardiovascular;  Laterality: N/A;  . CATARACT EXTRACTION W/PHACO Right 08/16/2016   Procedure: CATARACT EXTRACTION PHACO AND INTRAOCULAR LENS PLACEMENT (IOC);  Surgeon: Eulogio Bear, MD;  Location: [facility] ORS;  Service: Ophthalmology;  Laterality: Right;  Lot # [id number] H [place]: 01:00.8 AP%:16.5 CDE: 10.02   . CATARACT EXTRACTION W/PHACO Left 10/04/2016   Procedure: CATARACT EXTRACTION PHACO AND INTRAOCULAR LENS PLACEMENT (IOC);  Surgeon: Eulogio Bear, MD;  Location: [facility] ORS;  Service: Ophthalmology;  Laterality: Left;  [place] 35.4 AP% 10.5 CDE 4.11 Fluid pack lot # [id number]  . EYE SURGERY    . FRACTURE SURGERY     multiple right ankle  . HERNIA REPAIR    . JOINT REPLACEMENT Bilateral    thr  . KNEE ARTHROSCOPY W/ ACL RECONSTRUCTION     left x 2  . rcr      Prior to Admission medications   Medication Sig Start Date End Date Taking? Authorizing Provider  AMLODIPINE BESYLATE PO Take 10 mg by  mouth daily.    [provider]  atorvastatin (LIPITOR) 80 MG tablet Take 1 tablet (80 mg total) by mouth daily at 6 PM. 10/19/16   Fritzi Mandes, MD  carbamide peroxide (DEBROX) 6.5 % otic solution Place 10 drops into both ears daily.    [provider]  carvedilol (COREG) 12.5 MG tablet Take 12.5 mg by mouth 2 (two) times daily with a meal.    [provider]  clopidogrel (PLAVIX) 75 MG tablet Take 1 tablet (75 mg total) by mouth daily. 10/20/16   Fritzi Mandes, MD  gabapentin (NEURONTIN) 300 MG  capsule Take 300 mg by mouth 2 (two) times daily. 1 am 3 hs    [provider]  HYDROcodone-acetaminophen (NORCO) 10-325 MG tablet Take 1 tablet by mouth.    [provider]  lisinopril (PRINIVIL,ZESTRIL) 5 MG tablet Take 1 tablet (5 mg total) by mouth daily. 10/20/16   Fritzi Mandes, MD  sertraline (ZOLOFT) 100 MG tablet Take 200 mg by mouth daily.    [provider]  Testosterone Cypionate 200 MG/ML KIT Inject 1 mL into the muscle every 21 ( twenty-one) days.    [provider]    Allergies Morphine and related; Neomycin; Other; and Penicillins  No family history on file.  Social History Social History   Tobacco Use  . Smoking status: Former Smoker    Packs/day: 0.25    Types: Cigarettes  . Smokeless tobacco: Never Used  Substance Use Topics  . Alcohol use: No  . Drug use: No    Review of Systems  Constitutional: Negative for fever. Eyes: Negative for visual changes. ENT: Negative for sore throat. Neck: No neck pain  Cardiovascular: Negative for chest pain. Respiratory: Negative for shortness of breath. + cough Gastrointestinal: Negative for abdominal pain, vomiting or diarrhea. Genitourinary: Negative for dysuria. Musculoskeletal: Negative for back pain. Skin: Negative for rash. Neurological: Negative for headaches, weakness or numbness. Psych: No SI or HI  ____________________________________________   PHYSICAL EXAM:  VITAL SIGNS: ED Triage Vitals  Enc Vitals Group     BP --      Pulse Rate 01/08/19 0509 (!) 101     Resp 01/08/19 0509 (!) 24     Temp 01/08/19 0509 98 F (36.7 C)     Temp Source 01/08/19 0509 Oral     SpO2 01/08/19 0509 95 %     Weight 01/08/19 0512 186 lb (84.4 kg)     Height 01/08/19 0512 '5\' 8"'  (1.727 m)     Head Circumference --      Peak Flow --      Pain Score 01/08/19 0510 0     Pain Loc --      Pain Edu? --      Excl. in [place]? --     Constitutional: Alert and oriented, mildly intoxicated.    HEENT:      Head: Normocephalic and atraumatic.         Eyes: Conjunctivae are normal. Sclera is non-icteric.       Mouth/Throat: Mucous membranes are moist.       Neck: Supple with no signs of meningismus. Cardiovascular: Tachycardic with regular rhythm. No murmurs, gallops, or rubs. 2+ symmetrical distal pulses are present in all extremities. No JVD. Respiratory: Increased WOB, tachypneic with decreased air movement bilaterally.  Gastrointestinal: Soft, non tender, and non distended with positive bowel sounds. No rebound or guarding. Musculoskeletal: Nontender with normal range of motion in all extremities. No edema, cyanosis,  or erythema of extremities. Neurologic: Normal speech and language. Face is symmetric. Moving all extremities. No gross focal neurologic deficits are appreciated. Skin: Skin is warm, dry and intact. No rash noted. Psychiatric: Mood and affect are normal. Speech and behavior are normal.  ____________________________________________   LABS (all labs ordered are listed, but only abnormal results are displayed)  Labs Reviewed  CBC WITH DIFFERENTIAL/PLATELET - Abnormal; Notable for the following components:      Result Value   RBC 3.74 (*)    MCV 105.6 (*)    MCH 36.4 (*)    RDW 15.9 (*)    Platelets 41 (*)    All other components within normal limits  COMPREHENSIVE METABOLIC PANEL - Abnormal; Notable for the following components:   CO2 21 (*)    Glucose, Bld 58 (*)    Calcium 8.1 (*)    AST 74 (*)    Total Bilirubin 1.3 (*)    Anion gap 19 (*)    All other components within normal limits  URINALYSIS, COMPLETE (UACMP) WITH MICROSCOPIC - Abnormal; Notable for the following components:   Color, Urine YELLOW (*)    APPearance CLEAR (*)    Hgb urine dipstick LARGE (*)    Ketones, ur 20 (*)    Protein, ur 30 (*)    Nitrite POSITIVE (*)    Leukocytes,Ua TRACE (*)    Bacteria, UA RARE (*)    All other components within normal limits  TROPONIN I - Abnormal;  Notable for the following components:   Troponin I 0.04 (*)    All other components within normal limits  ETHANOL - Abnormal; Notable for the following components:   Alcohol, Ethyl (B) 268 (*)    All other components within normal limits  URINE CULTURE  LACTIC ACID, PLASMA   ____________________________________________  EKG  ED ECG REPORT I, Rudene Re, the attending physician, personally viewed and interpreted this ECG.  Sinus tachycardia, rate of 100, prolonged QTC, LAFB, no ST elevations or depressions.  Unchanged from prior. ____________________________________________  RADIOLOGY  I have personally reviewed the images performed during this visit and I agree with the Radiologist's read.   Interpretation by Radiologist:  Dg Chest 1 View  Result Date: 01/08/2019 CLINICAL DATA:  Cough and shortness of breath EXAM: CHEST  1 VIEW COMPARISON:  06/17/2018 FINDINGS: The heart size and mediastinal contours are within normal limits. Both lungs are clear. The visualized skeletal structures are unremarkable. IMPRESSION: No active disease. Electronically Signed   By: Ulyses Jarred M.D.   On: 01/08/2019 06:06      ____________________________________________   PROCEDURES  Procedure(s) performed: None Procedures Critical Care performed:  Yes  CRITICAL CARE Performed by: Rudene Re  ?  Total critical care time: 30 min  Critical care time was exclusive of separately billable procedures and treating other patients.  Critical care was necessary to treat or prevent imminent or life-threatening deterioration.  Critical care was time spent personally by me on the following activities: development of treatment plan with patient and/or surrogate as well as nursing, discussions with consultants, evaluation of patient's response to treatment, examination of patient, obtaining history from patient or surrogate, ordering and performing treatments and interventions, ordering and  review of laboratory studies, ordering and review of radiographic studies, pulse oximetry and re-evaluation of patient's condition.  ____________________________________________   INITIAL IMPRESSION / ASSESSMENT AND PLAN / ED COURSE  78 y.o. male history of alcohol abuse, multiple myeloma, hypertension, CAD status post STEMI who presents for  evaluation of feeling like he is going to die after drinking a pint of vodka.  Patient is mildly intoxicated, has increased work of breathing, tachypneic with no hypoxia and decreased air movement bilaterally.  According to patient this is his baseline.  Will give 1 DuoNeb.  Will get a chest x-ray to rule out pneumonia or pulmonary edema.  Will check basic labs to rule out alcoholic ketoacidosis, severe dehydration or electrolyte abnormalities.  EKG with no acute ischemic changes.    _________________________ 6:20 AM on 01/08/2019 -----------------------------------------  Labs consistent with UTI but no signs of sepsis, alcoholic ketoacidosis, hypoglycemia, worsening thrombocytopenia most likely due to alcohol abuse, and troponin of 0.04 most likely demand ischemia. WIll give IVF, D50, rocephin, and admit to Hospitalist. Discussed with Dr. Marcille Blanco for admission.    As part of my medical decision making, I reviewed the following data within the [place] notes reviewed and incorporated, Labs reviewed , EKG interpreted , Old chart reviewed, Radiograph reviewed , Discussed with admitting physician , Notes from prior ED visits and [place] Controlled Substance Database    Pertinent labs & imaging results that were available during my care of the patient were reviewed by me and considered in my medical decision making (see chart for details).    ____________________________________________   FINAL CLINICAL IMPRESSION(S) / ED DIAGNOSES  Final diagnoses:  Alcoholic ketoacidosis  Acute cystitis with hematuria  Alcoholic intoxication  without complication (HCC)  Demand ischemia of myocardium (HCC)  Thrombocytopenia (HCC)      NEW MEDICATIONS STARTED DURING THIS VISIT:  ED Discharge Orders    None       Note:  This document was prepared using Dragon voice recognition software and may include unintentional dictation errors.    Rudene Re, MD 01/08/19 [phone]

## 2019-01-08 NOTE — ED Notes (Signed)
ED TO INPATIENT HANDOFF REPORT  ED Nurse Name and Phone #:  Carney Harder [phone]  S Name/Age/Gender [name] 78 y.o. male Room/Bed: ED19A/ED19A  Code Status   Code Status: Prior  Home/SNF/Other Home Patient oriented to: self, place, time and situation Is this baseline? Yes      Chief Complaint [facility] - Respiratory distress  Triage Note Per EMS pt was seen yesterday morning by them and received a breathing treatment but didn't want to go to the hospital. Today they were called out because pt just feels horrible "like he could just die" VSS. Alert and Oriented but EMS states he seems confused. Pt answers all questions appropriately    Allergies Allergies  Allergen Reactions  . Morphine And Related   . Neomycin   . Other     METHIOLATE  . Penicillins     Level of Care/Admitting Diagnosis ED Disposition    ED Disposition Condition [place] [facility]: [place] [[id number]]  Level of Care: Stepdown [14]  Diagnosis: Alcoholic ketoacidosis [366440]  Admitting Physician: Harrie Foreman [[id number]]  Attending Physician: Harrie Foreman [[id number]]  Estimated length of stay: past midnight tomorrow  Certification:: I certify this patient will need inpatient services for at least 2 midnights  PT Class (Do Not Modify): Inpatient [101]  PT Acc Code (Do Not Modify): Private [1]       B Medical/Surgery History Past Medical History:  Diagnosis Date  . Alcohol abuse   . Anxiety   . Arthritis   . 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 Cancer ([place])    multiple myeloma  . Depression   . Dysrhythmia   . Fall    3 weeks ago/ torn ligaments left knee/wearing brace/pt  . Hypertension   . Neuropathy   . Pain    chronic leg and ankle pain  . RLS (restless legs syndrome)    Past Surgical History:  Procedure Laterality Date  . BRAIN SURGERY     subdural hematoma  . CARDIAC CATHETERIZATION N/A 10/16/2016   Procedure: Left Heart Cath and  Coronary Angiography;  Surgeon: Nelva Bush, MD;  Location: [place] CV LAB;  Service: Cardiovascular;  Laterality: N/A;  . CARDIAC CATHETERIZATION N/A 10/16/2016   Procedure: Coronary Stent Intervention;  Surgeon: Nelva Bush, MD;  Location: [place] [facility];  Service: Cardiovascular;  Laterality: N/A;  . CARDIAC CATHETERIZATION N/A 10/16/2016   Procedure: Temporary Pacemaker;  Surgeon: Nelva Bush, MD;  Location: [place] [facility];  Service: Cardiovascular;  Laterality: N/A;  . CATARACT EXTRACTION W/PHACO Right 08/16/2016   Procedure: CATARACT EXTRACTION PHACO AND INTRAOCULAR LENS PLACEMENT (IOC);  Surgeon: Eulogio Bear, MD;  Location: [facility] ORS;  Service: Ophthalmology;  Laterality: Right;  Lot # [id number] H [place]: 01:00.8 AP%:16.5 CDE: 10.02   . CATARACT EXTRACTION W/PHACO Left 10/04/2016   Procedure: CATARACT EXTRACTION PHACO AND INTRAOCULAR LENS PLACEMENT (IOC);  Surgeon: Eulogio Bear, MD;  Location: [facility] ORS;  Service: Ophthalmology;  Laterality: Left;  [place] 35.4 AP% 10.5 CDE 4.11 Fluid pack lot # [id number]  . EYE SURGERY    . FRACTURE SURGERY     multiple right ankle  . HERNIA REPAIR    . JOINT REPLACEMENT Bilateral    thr  . KNEE ARTHROSCOPY W/ ACL RECONSTRUCTION     left x 2  . rcr       A IV Location/Drains/Wounds Patient Lines/Drains/Airways Status   Active Line/Drains/Airways    Name:  Placement date:   Placement time:   Site:   Days:   Peripheral IV 10/28/16 Left Antecubital   10/28/16    1730    Antecubital   802   Peripheral IV 01/08/19 Right Antecubital   01/08/19    0553    Antecubital   less than 1   Incision (Closed) 08/16/16 Eye   08/16/16    1252     875   Incision (Closed) 10/04/16 Eye Left   10/04/16    0958     826   Pressure Injury 10/18/16 Stage II -  Partial thickness loss of dermis presenting as a shallow open ulcer with a red, pink wound bed without slough. right 2nd toe ulcer   10/18/16    1200     812           Intake/Output Last 24 hours  Intake/Output Summary (Last 24 hours) at 01/08/2019 0730 Last data filed at 01/08/2019 0730 Gross per 24 hour  Intake 1002.33 ml  Output -  Net 1002.33 ml    Labs/Imaging Results for orders placed or performed during the hospital encounter of 01/08/19 (from the past 48 hour(s))  CBC with Differential/Platelet     Status: Abnormal   Collection Time: 01/08/19  5:11 AM  Result Value Ref Range   WBC 9.3 4.0 - 10.5 K/uL   RBC 3.74 (L) 4.22 - 5.81 MIL/uL   Hemoglobin 13.6 13.0 - 17.0 g/dL   HCT 39.5 39.0 - 52.0 %   MCV 105.6 (H) 80.0 - 100.0 fL   MCH 36.4 (H) 26.0 - 34.0 pg   MCHC 34.4 30.0 - 36.0 g/dL   RDW 15.9 (H) 11.5 - 15.5 %   Platelets 41 (L) 150 - 400 K/uL    Comment: Immature Platelet Fraction may be clinically indicated, consider ordering this additional test [id number]    nRBC 0.0 0.0 - 0.2 %   Neutrophils Relative % 73 %   Neutro Abs 6.6 1.7 - 7.7 K/uL   Lymphocytes Relative 20 %   Lymphs Abs 1.9 0.7 - 4.0 K/uL   Monocytes Relative 7 %   Monocytes Absolute 0.7 0.1 - 1.0 K/uL   Eosinophils Relative 0 %   Eosinophils Absolute 0.0 0.0 - 0.5 K/uL   Basophils Relative 0 %   Basophils Absolute 0.0 0.0 - 0.1 K/uL   Immature Granulocytes 0 %   Abs Immature Granulocytes 0.04 0.00 - 0.07 K/uL    Comment: Performed at [facility], [address] [place], [place] [address]  Comprehensive metabolic panel     Status: Abnormal   Collection Time: 01/08/19  5:11 AM  Result Value Ref Range   Sodium 143 135 - 145 mmol/L   Potassium 4.1 3.5 - 5.1 mmol/L   Chloride 103 98 - 111 mmol/L   CO2 21 (L) 22 - 32 mmol/L   Glucose, Bld 58 (L) 70 - 99 mg/dL   BUN 21 8 - 23 mg/dL   Creatinine, Ser 0.80 0.61 - 1.24 mg/dL   Calcium 8.1 (L) 8.9 - 10.3 mg/dL   Total Protein 7.4 6.5 - 8.1 g/dL   Albumin 3.9 3.5 - 5.0 g/dL   AST 74 (H) 15 - 41 U/L   ALT 28 0 - 44 U/L   Alkaline Phosphatase 79 38 - 126 U/L   Total Bilirubin 1.3 (H) 0.3 - 1.2 mg/dL    GFR calc non Af Amer >60 >60 mL/min   GFR calc Af Amer >60 >60  mL/min   Anion gap 19 (H) 5 - 15    Comment: Performed at [facility], [address] [place], [place] [address]  Urinalysis, Complete w Microscopic     Status: Abnormal   Collection Time: 01/08/19  5:11 AM  Result Value Ref Range   Color, Urine YELLOW (A) YELLOW   APPearance CLEAR (A) CLEAR   Specific Gravity, Urine 1.020 1.005 - 1.030   pH 5.0 5.0 - 8.0   Glucose, UA NEGATIVE NEGATIVE mg/dL   Hgb urine dipstick LARGE (A) NEGATIVE   Bilirubin Urine NEGATIVE NEGATIVE   Ketones, ur 20 (A) NEGATIVE mg/dL   Protein, ur 30 (A) NEGATIVE mg/dL   Nitrite POSITIVE (A) NEGATIVE   Leukocytes,Ua TRACE (A) NEGATIVE   RBC / HPF 6-10 0 - 5 RBC/hpf   WBC, UA 21-50 0 - 5 WBC/hpf   Bacteria, UA RARE (A) NONE SEEN   Squamous Epithelial / LPF 0-5 0 - 5   Mucus PRESENT    Hyaline Casts, UA PRESENT     Comment: Performed at [facility], [address] [place], [place] [address]  Troponin I - ONCE - STAT     Status: Abnormal   Collection Time: 01/08/19  5:11 AM  Result Value Ref Range   Troponin I 0.04 (HH) <0.03 ng/mL    Comment: CRITICAL RESULT CALLED TO, READ BACK BY AND VERIFIED WITH JENNIFER DAILY AT 0551 01/08/2019 SMA Performed at [facility], [address] [place], [place] [address]   Ethanol     Status: Abnormal   Collection Time: 01/08/19  5:12 AM  Result Value Ref Range   Alcohol, Ethyl (B) 268 (H) <10 mg/dL    Comment: (NOTE) Lowest detectable limit for serum alcohol is 10 mg/dL. For medical purposes only. Performed at [facility], [address] [place], [place] [address]    Dg Chest 1 View  Result Date: 01/08/2019 CLINICAL DATA:  Cough and shortness of breath EXAM: CHEST  1 VIEW COMPARISON:  06/17/2018 FINDINGS: The heart size and mediastinal contours are within normal limits. Both lungs are clear. The visualized skeletal structures are unremarkable. IMPRESSION: No  active disease. Electronically Signed   By: Ulyses Jarred M.D.   On: 01/08/2019 06:06    Pending Labs Unresulted Labs (From admission, onward)    Start     Ordered   01/08/19 1655  Lactic acid, plasma  ONCE - STAT,   STAT     01/08/19 0622   01/08/19 0541  Urine Culture  Add-on,   AD     01/08/19 0541   Signed and Held  Basic metabolic panel  STAT Now then every 4 hours ,   STAT     Signed and Held   Signed and Held  Troponin I - Now Then Q6H  Now then every 6 hours,   R     Signed and Held          Vitals/Pain Today's Vitals   01/08/19 0512 01/08/19 0530 01/08/19 0552 01/08/19 0630  BP:  (!) 144/95  (!) 142/92  Pulse:  (!) 103 (!) 105 95  Resp:  '19 18 13  ' Temp:      TempSrc:      SpO2:  95% 97% 96%  Weight: 84.4 kg     Height: '5\' 8"'  (1.727 m)     PainSc:        Isolation Precautions No active isolations  Medications Medications  ipratropium-albuterol (DUONEB) 0.5-2.5 (3)  MG/3ML nebulizer solution 3 mL (3 mLs Nebulization Given 01/08/19 0530)  cefTRIAXone (ROCEPHIN) 1 g in sodium chloride 0.9 % 100 mL IVPB (0 g Intravenous Stopped 01/08/19 0637)  sodium chloride 0.9 % bolus 1,000 mL ( Intravenous Stopped 01/08/19 0709)  dextrose 50 % solution 50 mL (50 mLs Intravenous Given 01/08/19 4360)    Mobility manual wheelchair High fall risk   Focused Assessments Cardiac Assessment Handoff:    Lab Results  Component Value Date   TROPONINI 0.04 ([place]) 01/08/2019   No results found for: DDIMER Does the Patient currently have chest pain? No     R Recommendations: See Admitting Provider Note  Report given to:   Additional Notes:  Patient from home with wife, stands to pivot into wheelchair. Current every day drinker and smoker.

## 2019-01-08 NOTE — H&P (Signed)
[facility] NAME: Ryan Travis    MR#:  [id number]  DATE OF BIRTH:  [birth year]  DATE OF ADMISSION:  01/08/2019  PRIMARY CARE PHYSICIAN: [place]   REQUESTING/REFERRING PHYSICIAN: Dr. Alfred Levins  CHIEF COMPLAINT:   Chief Complaint  Patient presents with  . Respiratory Distress    HISTORY OF PRESENT ILLNESS:  Ryan Travis  is a 78 y.o. male with a known history of multiple myeloma in remission, hypertension, CAD, STEMI s/p stent, depression, and polysubstance abuse who presented to the ED "feeling like death was at my doorstep, like I was going to die". Wife called 75 yesterday morning because he was having shortness of breath, received 1 DuoNeb treatment and refused to transfer to the hospital.   Onset of symptoms after drinking a pint of vodka. Persistent. Described as dizziness, weakness, shortness of breath however this is chronic. No chest pain, abdominal pain, fall (though he had one 2 weeks ago), or loss of consciousness. Associated with nausea and unknown number of episodes of non-bloody emesis though he states this is a daily occurrence. He drinks 1 pint of vodka daily. Smokes about 1/2 pack of cigarettes a day. Endorses marijuana use without being asked. 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 Historical care at the [place] though he does not want to be transferred at this time.  No fever, chills, weight loss, dysuria, urgency, frequency, or hematuria. Family history of heart attack in his mother who died at age 50. Father died of renal disease unspecified age. Patient actively intoxicated with elevated ethanol level in the ED. BS 58. + anion gap. Thrombocytopenic. Admitted to the hospitalist service for alcoholic ketoacidosis. Sepsis rule out ? UTI + nitrite + protein + ketones . CIWA protocol initiated.  PAST MEDICAL HISTORY:   Past Medical History:  Diagnosis Date  . Alcohol abuse   . Anxiety     . Arthritis   . 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 Cancer ([place])    multiple myeloma  . Depression   . Dysrhythmia   . Fall    3 weeks ago/ torn ligaments left knee/wearing brace/pt  . Hypertension   . Neuropathy   . Pain    chronic leg and ankle pain  . RLS (restless legs syndrome)     PAST SURGICAL HISTORY:   Past Surgical History:  Procedure Laterality Date  . BRAIN SURGERY     subdural hematoma  . CARDIAC CATHETERIZATION N/A 10/16/2016   Procedure: Left Heart Cath and Coronary Angiography;  Surgeon: Nelva Bush, MD;  Location: [facility];  Service: Cardiovascular;  Laterality: N/A;  . CARDIAC CATHETERIZATION N/A 10/16/2016   Procedure: Coronary Stent Intervention;  Surgeon: Nelva Bush, MD;  Location: [facility];  Service: Cardiovascular;  Laterality: N/A;  . CARDIAC CATHETERIZATION N/A 10/16/2016   Procedure: Temporary Pacemaker;  Surgeon: Nelva Bush, MD;  Location: [facility];  Service: Cardiovascular;  Laterality: N/A;  . CATARACT EXTRACTION W/PHACO Right 08/16/2016   Procedure: CATARACT EXTRACTION PHACO AND INTRAOCULAR LENS PLACEMENT (IOC);  Surgeon: Eulogio Bear, MD;  Location: [facility] OR[facility];  Service: Ophthalmology;  Laterality: Right;  Lot # [id number] H [place]: 01:00.8 AP%:16.5 CDE: 10.02   . CATARACT EXTRACTION W/PHACO Left 10/04/2016   Procedure: CATARACT EXTRACTION PHACO AND INTRAOCULAR LENS PLACEMENT (IOC);  Surgeon: Eulogio Bear, MD;  Location: [facility];  Service: Ophthalmology;  Laterality: Left;  [place] 35.4 AP%  10.5 CDE 4.11 Fluid pack lot # [id number]  . EYE SURGERY    . FRACTURE SURGERY     multiple right ankle  . HERNIA REPAIR    . JOINT REPLACEMENT Bilateral    thr  . KNEE ARTHROSCOPY W/ ACL RECONSTRUCTION     left x 2  . rcr      SOCIAL HISTORY:   1/2 pack cigarettes a day. Regular marijuana use. 1 pint of vodka a day  FAMILY HISTORY:   Mother died at age 68 heart attack Father died of renal disease    DRUG ALLERGIES:   Allergies  Allergen Reactions  . Azithromycin Other (See Comments)    Other reaction(s): Unknown   . Morphine And Related   . Neomycin   . Other     METHIOLATE  . Penicillins     REVIEW OF SYSTEMS:   Review of Systems  Constitutional: Negative for chills, diaphoresis, fever and weight loss.  HENT: Negative for congestion, ear discharge, ear pain, sinus pain and sore throat.   Eyes: Negative for blurred vision, double vision and photophobia.  Respiratory: Positive for cough. Negative for hemoptysis, sputum production, shortness of breath and wheezing.   Cardiovascular: Positive for leg swelling. Negative for chest pain, palpitations, orthopnea, claudication and PND.  Gastrointestinal: Positive for nausea and vomiting. Negative for abdominal pain, blood in stool, diarrhea and heartburn.  Genitourinary: Negative for dysuria, flank pain, frequency, hematuria and urgency.  Musculoskeletal: Positive for falls (2 weeks ago), joint pain and myalgias. Negative for back pain and neck pain.  Skin: Negative for itching and rash.  Neurological: Positive for dizziness and weakness. Negative for focal weakness, seizures, loss of consciousness and headaches.  Endo/Heme/Allergies: Does not bruise/bleed easily.  Psychiatric/Behavioral: Positive for substance abuse. Negative for hallucinations and suicidal ideas. The patient is not nervous/anxious.    MEDICATIONS AT HOME:   Prior to Admission medications   Medication Sig Start Date End Date Taking? Authorizing Provider  AMLODIPINE BESYLATE PO Take 10 mg by mouth daily.    [provider]  atorvastatin (LIPITOR) 80 MG tablet Take 1 tablet (80 mg total) by mouth daily at 6 PM. 10/19/16   Fritzi Mandes, MD  carbamide peroxide (DEBROX) 6.5 % otic solution Place 10 drops into both ears daily.    [provider]  carvedilol (COREG) 12.5 MG tablet Take 12.5 mg by mouth 2 (two) times daily with a meal.    [provider]  clopidogrel (PLAVIX) 75 MG tablet Take 1 tablet (75 mg total) by mouth daily. 10/20/16   Fritzi Mandes, MD  gabapentin (NEURONTIN) 300 MG capsule Take 300 mg by mouth 2 (two) times daily. 1 am 3 hs    [provider]  HYDROcodone-acetaminophen (NORCO) 10-325 MG tablet Take 1 tablet by mouth.    [provider]  lisinopril (PRINIVIL,ZESTRIL) 5 MG tablet Take 1 tablet (5 mg total) by mouth daily. 10/20/16   Fritzi Mandes, MD  sertraline (ZOLOFT) 100 MG tablet Take 200 mg by mouth daily.    [provider]  Testosterone Cypionate 200 MG/ML KIT Inject 1 mL into the muscle every 21 ( twenty-one) days.    [provider]    VITAL SIGNS:  Blood pressure 132/90, pulse (!) 104, temperature 98 F (36.7 C), temperature source Oral, resp. rate 19, height '5\' 8"'  (1.727 m), weight 84.4 kg, SpO2 94 %.  PHYSICAL EXAMINATION:  Physical Exam GENERAL:  78 y.o.-year-old patient lying in the bed with no acute  distress. Acutely intoxicated.  EYES: Pupils equal, round, reactive to light and accommodation. No scleral icterus. Extraocular muscles intact.  HEENT: Head atraumatic, normocephalic. Oropharynx and nasopharynx clear.  NECK:  Supple, no jugular venous distention. No thyroid enlargement, no tenderness.  LUNGS: Normal breath sounds bilaterally, no wheezing, rales,rhonchi or crepitation. No use of accessory muscles of respiration. Increased work of breathing with conversation.  CARDIOVASCULAR: S1, S2 normal. No murmurs, rubs, or gallops.  ABDOMEN: Soft, nontender, nondistended. Bowel sounds present. No organomegaly or mass.  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 + pain to palpation in the ankles, knees L>R. No, cyanosis, or clubbing. Distal pulses 2+ NEUROLOGIC: Cranial nerves II through XII are intact. Muscle strength 5/5 in all extremities. Sensation intact. Gait not checked.  PSYCHIATRIC: The  patient is alert and oriented x 3.  SKIN: No obvious rash, lesion, or ulcer.  LABORATORY PANEL:   CBC Recent Labs  Lab 01/08/19 0511  WBC 9.3  HGB 13.6  HCT 39.5  PLT 41*   ------------------------------------------------------------------------------------------------------------------  Chemistries  Recent Labs  Lab 01/08/19 0511  NA 143  K 4.1  CL 103  CO2 21*  GLUCOSE 58*  BUN 21  CREATININE 0.80  CALCIUM 8.1*  AST 74*  ALT 28  ALKPHOS 79  BILITOT 1.3*   ------------------------------------------------------------------------------------------------------------------  Cardiac Enzymes Recent Labs  Lab 01/08/19 0511  TROPONINI 0.04*   ------------------------------------------------------------------------------------------------------------------  RADIOLOGY:  Dg Chest 1 View  Result Date: 01/08/2019 CLINICAL DATA:  Cough and shortness of breath EXAM: CHEST  1 VIEW COMPARISON:  06/17/2018 FINDINGS: The heart size and mediastinal contours are within normal limits. Both lungs are clear. The visualized skeletal structures are unremarkable. IMPRESSION: No active disease. Electronically Signed   By: Ulyses Jarred M.D.   On: 01/08/2019 06:06   IMPRESSION AND PLAN:   # alcoholic ketoacidosis Fluid bolus and D5W given in the ED. No need for insulin at this time as sugar is low. Hemodynamically stable. Monitor electrolytes and supplement as needed.  # respiratory distress Pulse oximetry and close monitoring, PRN breathing treatments ordered  # alcohol abuse CIWA protocol. Continue to assess and counsel as he stabilizes  # UTI, sepsis rule out No symptoms endorsed. Afebrile. Lactate pending. Started empirically on IV rocephin. Urine culture sent. Blood culture if + lactate.  # elevated troponin Elevated to 0.04. He has a history of CAD, STEMI s/p stent. Not endorsing chest pain. Follow serial troponin. Cardiology consult if elevated serially. Cardiac monitoring.  Lipid panel and A1C ordered.   #tobacco abuse Nicotine patch ordered  # neuropathy Continue gabapentin  # hypertension Continue home meds   # depression Zoloft   #DVT prophylaxis SCDs, he is thrombocytopenic   All the records are reviewed and case discussed with ED provider. Management plans discussed with the patient, family and they are in agreement.  CODE STATUS: full code  TOTAL TIME TAKING CARE OF THIS PATIENT: 45 minutes.    Lashayla Armes PA-C on 01/08/2019 at 8:12 AM  Between 7am to 6pm - Pager - [phone]  After 6pm go to [email or web address] - Proofreader  [facility]  Office  [phone]  CC: Primary care physician; [place]   Note: This dictation was prepared with Dragon dictation along with smaller phrase technology. Any transcriptional errors that result from this process are unintentional.

## 2019-01-08 NOTE — ED Notes (Addendum)
This RN spoke with RN Shirlean Mylar in order to provide report to assigned Room 120. RN Shirlean Mylar requesting different level of care.  This RN paged Attending Manuella Ghazi and spoke with him in regards to pt status and room assignment.

## 2019-01-08 NOTE — ED Notes (Signed)
Date and time results received: 01/08/19 8:16 AM  (use smartphrase ".now" to insert current time)  Test: lactic acid Critical Value: 5.2  Name of Provider Notified: Manuella Ghazi  Orders Received? Or Actions Taken?: Actions Taken: MD nofied

## 2019-01-09 ENCOUNTER — Inpatient Hospital Stay: Payer: Medicare Other

## 2019-01-09 DIAGNOSIS — E872 Acidosis: Secondary | ICD-10-CM

## 2019-01-09 DIAGNOSIS — F10230 Alcohol dependence with withdrawal, uncomplicated: Secondary | ICD-10-CM

## 2019-01-09 DIAGNOSIS — B9620 Unspecified Escherichia coli [E. coli] as the cause of diseases classified elsewhere: Secondary | ICD-10-CM

## 2019-01-09 DIAGNOSIS — N390 Urinary tract infection, site not specified: Secondary | ICD-10-CM

## 2019-01-09 LAB — CBC
HCT: 34.7 % — ABNORMAL LOW (ref 39.0–52.0)
Hemoglobin: 12.3 g/dL — ABNORMAL LOW (ref 13.0–17.0)
MCH: 36 pg — ABNORMAL HIGH (ref 26.0–34.0)
MCHC: 35.4 g/dL (ref 30.0–36.0)
MCV: 101.5 fL — ABNORMAL HIGH (ref 80.0–100.0)
Platelets: 29 10*3/uL — CL (ref 150–400)
RBC: 3.42 MIL/uL — ABNORMAL LOW (ref 4.22–5.81)
RDW: 14.6 % (ref 11.5–15.5)
WBC: 6.4 10*3/uL (ref 4.0–10.5)
nRBC: 0.3 % — ABNORMAL HIGH (ref 0.0–0.2)

## 2019-01-09 LAB — GLUCOSE, CAPILLARY
Glucose-Capillary: 107 mg/dL — ABNORMAL HIGH (ref 70–99)
Glucose-Capillary: 125 mg/dL — ABNORMAL HIGH (ref 70–99)
Glucose-Capillary: 191 mg/dL — ABNORMAL HIGH (ref 70–99)
Glucose-Capillary: 153 mg/dL — ABNORMAL HIGH (ref 70–99)
Glucose-Capillary: 107 mg/dL — ABNORMAL HIGH (ref 70–99)

## 2019-01-09 LAB — BASIC METABOLIC PANEL
Anion gap: 11 (ref 5–15)
Anion gap: 8 (ref 5–15)
BUN: 10 mg/dL (ref 8–23)
BUN: 13 mg/dL (ref 8–23)
CO2: 28 mmol/L (ref 22–32)
CO2: 27 mmol/L (ref 22–32)
CREATININE: 0.63 mg/dL (ref 0.61–1.24)
Calcium: 8.1 mg/dL — ABNORMAL LOW (ref 8.9–10.3)
Calcium: 8 mg/dL — ABNORMAL LOW (ref 8.9–10.3)
Chloride: 95 mmol/L — ABNORMAL LOW (ref 98–111)
Chloride: 96 mmol/L — ABNORMAL LOW (ref 98–111)
Creatinine, Ser: 0.55 mg/dL — ABNORMAL LOW (ref 0.61–1.24)
GFR calc Af Amer: >60 mL/min (ref >60)
GFR calc Af Amer: >60 mL/min (ref >60)
GFR calc non Af Amer: >60 mL/min (ref >60)
GLUCOSE: 146 mg/dL — AB (ref 70–99)
Glucose, Bld: 132 mg/dL — ABNORMAL HIGH (ref 70–99)
Potassium: 2.8 mmol/L — ABNORMAL LOW (ref 3.5–5.1)
Potassium: 3.6 mmol/L (ref 3.5–5.1)
Sodium: 134 mmol/L — ABNORMAL LOW (ref 135–145)
Sodium: 131 mmol/L — ABNORMAL LOW (ref 135–145)

## 2019-01-09 LAB — MAGNESIUM: Magnesium: 1.3 mg/dL — ABNORMAL LOW (ref 1.7–2.4)

## 2019-01-09 MED ORDER — IPRATROPIUM-ALBUTEROL 0.5-2.5 (3) MG/3ML IN SOLN: 3.0000 mL | Freq: Once | RESPIRATORY_TRACT | Status: DC

## 2019-01-09 MED ORDER — MAGNESIUM SULFATE 2 GM/50ML IV SOLN
2.0000 g | Freq: Once | INTRAVENOUS | Status: AC
  Administered 2019-01-09: 2 g via INTRAVENOUS
  Filled 2019-01-09: qty 50

## 2019-01-09 MED ORDER — CHLORDIAZEPOXIDE HCL 5 MG PO CAPS
25.0000 mg | ORAL_CAPSULE | Freq: Three times a day (TID) | ORAL | Status: DC
  Administered 2019-01-09 – 2019-01-10 (×3): 25 mg via ORAL
  Filled 2019-01-09 (×3): qty 2
  Filled 2019-01-09: qty 5

## 2019-01-09 MED ORDER — POTASSIUM CHLORIDE 10 MEQ/100ML IV SOLN
10.0000 meq | INTRAVENOUS | Status: AC
  Administered 2019-01-09 (×4): 10 meq via INTRAVENOUS
  Filled 2019-01-09: qty 100

## 2019-01-09 MED ORDER — SODIUM CHLORIDE 0.9 % IV SOLN
INTRAVENOUS | Status: DC | PRN
  Administered 2019-01-09: 250 mL via INTRAVENOUS
  Administered 2019-01-12: 500 mL via INTRAVENOUS

## 2019-01-09 MED ORDER — LORAZEPAM 2 MG PO TABS: 2.0000 mg | ORAL_TABLET | Freq: Four times a day (QID) | ORAL | Status: DC | PRN

## 2019-01-09 MED ORDER — CLONIDINE HCL 0.1 MG PO TABS
0.1000 mg | ORAL_TABLET | Freq: Once | ORAL | Status: AC
  Administered 2019-01-09: 0.1 mg via ORAL
  Filled 2019-01-09: qty 1

## 2019-01-09 MED ORDER — POTASSIUM CHLORIDE CRYS ER 20 MEQ PO TBCR: 40.0000 meq | EXTENDED_RELEASE_TABLET | ORAL | Status: DC

## 2019-01-09 MED ORDER — LORAZEPAM 2 MG/ML IJ SOLN: 0.0000 mg | Freq: Two times a day (BID) | INTRAMUSCULAR | Status: DC

## 2019-01-09 MED ORDER — LOPERAMIDE HCL 2 MG PO CAPS: 2.0000 mg | ORAL_CAPSULE | Freq: Four times a day (QID) | ORAL | Status: DC | PRN

## 2019-01-09 MED ORDER — LORAZEPAM 2 MG/ML IJ SOLN
1.0000 mg | Freq: Four times a day (QID) | INTRAMUSCULAR | Status: AC | PRN
  Administered 2019-01-09 (×2): 1 mg via INTRAVENOUS
  Filled 2019-01-09 (×2): qty 1

## 2019-01-09 MED ORDER — LORAZEPAM 2 MG/ML IJ SOLN
0.0000 mg | Freq: Four times a day (QID) | INTRAMUSCULAR | Status: AC
  Administered 2019-01-09: 05:00:00 1 mg via INTRAVENOUS
  Filled 2019-01-09: qty 1

## 2019-01-09 MED ORDER — NEOMYCIN-POLYMYXIN-DEXAMETH 3.5-10000-0.1 OP SUSP
1.0000 [drp] | Freq: Three times a day (TID) | OPHTHALMIC | Status: DC
  Administered 2019-01-10 – 2019-01-12 (×8): 1 [drp] via OPHTHALMIC
  Filled 2019-01-09: qty 5

## 2019-01-09 MED ORDER — LACTATED RINGERS IV BOLUS
1000.0000 mL | Freq: Once | INTRAVENOUS | Status: AC
  Administered 2019-01-09: 1000 mL via INTRAVENOUS

## 2019-01-09 MED ORDER — POTASSIUM CHLORIDE CRYS ER 20 MEQ PO TBCR
40.0000 meq | EXTENDED_RELEASE_TABLET | Freq: Once | ORAL | Status: AC
  Administered 2019-01-09: 40 meq via ORAL
  Filled 2019-01-09: qty 2

## 2019-01-09 MED ORDER — KETOTIFEN FUMARATE 0.025 % OP SOLN
1.0000 [drp] | Freq: Two times a day (BID) | OPHTHALMIC | Status: DC
  Administered 2019-01-10 – 2019-01-12 (×6): 1 [drp] via OPHTHALMIC
  Filled 2019-01-09: qty 5

## 2019-01-09 MED ORDER — IPRATROPIUM-ALBUTEROL 0.5-2.5 (3) MG/3ML IN SOLN
3.0000 mL | RESPIRATORY_TRACT | Status: DC
  Administered 2019-01-09 – 2019-01-10 (×6): 3 mL via RESPIRATORY_TRACT
  Filled 2019-01-09 (×6): qty 3

## 2019-01-09 MED ORDER — LORAZEPAM 2 MG/ML IJ SOLN: 2.0000 mg | Freq: Four times a day (QID) | INTRAMUSCULAR | Status: DC | PRN

## 2019-01-09 MED ORDER — LORAZEPAM 1 MG PO TABS
1.0000 mg | ORAL_TABLET | Freq: Four times a day (QID) | ORAL | Status: AC | PRN
  Administered 2019-01-09 – 2019-01-12 (×3): 1 mg via ORAL
  Filled 2019-01-09 (×4): qty 1

## 2019-01-09 NOTE — Progress Notes (Signed)
Patient returned to ICU this AM with increased agitation. After receiving scheduled librium and clonidine, and one dose of PRN ativan, patient has remained obtunded, however, arousable Will remain in SDU overnight as Bps have ran low while the patient has been asleep.

## 2019-01-09 NOTE — Progress Notes (Signed)
[facility] [place] NAME: Ryan Travis    MR#:  [id number]  DATE OF BIRTH:  May 23, 1941  SUBJECTIVE:  CHIEF COMPLAINT:   Chief Complaint  Patient presents with  . Respiratory Distress   Seen this AM with daughter at bedside. Initially only able to rouse with shouting and sternal rub, then awake but agitated. Transferred to ICU for continuously scoring max on CIWA. Daughter at bedside. States he chronically takes benzodiazepines at home for withdrawal.   Interest in substance abuse counseling/AA once he is stable. At this time no transfer to the [place].   REVIEW OF SYSTEMS:  ROS Unable to assess given his mental status.   DRUG ALLERGIES:   Allergies  Allergen Reactions  . Azithromycin Other (See Comments)    Other reaction(s): Unknown   . Morphine And Related   . Neomycin   . Other     METHIOLATE  . Penicillins     Did it involve swelling of the face/tongue/throat, SOB, or low BP? Yes Did it involve sudden or severe rash/hives, skin peeling, or any reaction on the inside of your mouth or nose? Yes Did you need to seek medical attention at a hospital or doctor's office? Yes When did it last happen? 10 years If all above answers are "NO", may proceed with cephalosporin use.   VITALS:  Blood pressure (!) 82/63, pulse 74, temperature 98.5 F (36.9 C), temperature source Oral, resp. rate 18, height _0  (1.727 m), weight 84.4 kg, SpO2 100 %. PHYSICAL EXAMINATION:  Physical Exam GENERAL:  78 y.o.-year-old patient lying in the bed with acute agitation.  EYES: Pupils equal, round, reactive to light and accommodation. No scleral icterus. Extraocular muscles intact.  HEENT: Head atraumatic, normocephalic. Oropharynx and nasopharynx clear.  NECK:  Supple, no jugular venous distention. No thyroid enlargement, no tenderness.  LUNGS: Normal breath sounds bilaterally anteriorly, no wheezing, rales,rhonchi or crepitation. No use of accessory  muscles of respiration. Increased work of breathing with conversation.  CARDIOVASCULAR: S1, S2 normal. No murmurs, rubs, or gallops.  ABDOMEN: Soft, nontender, nondistended. Bowel sounds present. No organomegaly or mass.  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 + pain to palpation in the ankles, knees L>R. No, cyanosis, or clubbing. Distal pulses 2+ NEUROLOGIC: Cranial nerves II through XII are intact. Muscle strength 5/5 in all extremities. Sensation intact. Gait not checked.  PSYCHIATRIC: The patient is in acute withdrawal with agitation.  SKIN: No obvious rash, lesion, or ulcer otherwise.  LABORATORY PANEL:  Male CBC Recent Labs  Lab 01/09/19 0322  WBC 6.4  HGB 12.3*  HCT 34.7*  PLT 29*   ------------------------------------------------------------------------------------------------------------------ Chemistries  Recent Labs  Lab 01/08/19 0511  01/09/19 0322  NA 143   < > 134*  K 4.1   < > 2.8*  CL 103   < > 95*  CO2 21*   < > 28  GLUCOSE 58*   < > 146*  BUN 21   < > 10  CREATININE 0.80   < > 0.55*  CALCIUM 8.1*   < > 8.1*  MG  --    < > 1.3*  AST 74*  --   --   ALT 28  --   --   ALKPHOS 79  --   --   BILITOT 1.3*  --   --    < > =  values in this interval not displayed.   RADIOLOGY:  Ct Head Wo Contrast  Result Date: 01/09/2019 CLINICAL DATA:  Altered mental status EXAM: CT HEAD WITHOUT CONTRAST TECHNIQUE: Contiguous axial images were obtained from the base of the skull through the vertex without intravenous contrast. COMPARISON:  06/17/2018 FINDINGS: Brain: No evidence of acute infarction, hemorrhage, hydrocephalus, extra-axial collection or mass lesion/mass effect. Remote right inferior cerebellar infarct. Moderate generalized atrophy with mild small vessel ischemic change in the supratentorial white matter Vascular: Atherosclerotic calcification Skull: Left parietal burr  holes. No acute or aggressive finding. Frontal scalp lipoma that is right para median Sinuses/Orbits: Cataract resection on both sides. IMPRESSION: 1. No acute finding or change from prior. 2. Atrophy and remote right cerebellar infarct. Electronically Signed   By: Monte Fantasia M.D.   On: 01/09/2019 08:45   ASSESSMENT AND PLAN:   Ryan Travis  is a 78 y.o. male veteran follows at [facility] with a known history of multiple myeloma in remission, hypertension, CAD, STEMI s/p stent, depression, and polysubstance abuse who presented to the ED "feeling like death was at my doorstep, like I was going to die". Symptoms after drinking a pint of vodka. Described as dizziness, weakness, shortness of breath however this is chronic. No chest pain, abdominal pain, fall (though he had one 2-3 weeks ago), or loss of consciousness. Associated with nausea and unknown number of episodes of non-bloody emesis though he states this is a daily occurrence.  Patient actively intoxicated with elevated ethanol level in the ED. BS 58. + anion gap. Thrombocytopenic. Admitted to the hospitalist service for alcoholic ketoacidosis with acute intoxication. Sepsis due to UTI, UA + nitrite + protein + ketones. CIWA protocol initiated. Now in ICU.  Plan  # alcoholic ketoacidosis Fluid bolus and D5W given in the ED. IVF initially. No need for insulin as sugar was low. Hemodynamically stable. Monitor electrolytes and supplement as needed. Pharmacy following.  Most recent hypomagnesia 1.3 hypokalemia 2.8.  # dysphagia Was Npo and seen by speech therapy for evaluation of difficulty swallowing. Diet recommendation given and orders placed- mech soft/regular with aspiration precautions while at current cognitive status     # respiratory distress Pulse oximetry and close monitoring, PRN breathing treatments ordered  # alcohol abuse with acute withdrawal CIWA protocol. Continue to assess and counsel as he stabilizes. Transferred  to ICU 01/09/2019 acute altered mental status. CT head 01/09/2019 negative for acute process. Remote infarct and atrophy.   # UTI, sepsis No symptoms endorsed. Afebrile. Lactate 1.5. Started empirically on IV rocephin. Now on Keflex. Urine culture sent + E coli . Blood culture no growth < 24 hrs.   # elevated troponin Elevated to 0.04 initially, down to 0.03. Likely demand ischemia from sepsis, UTI. He has a history of CAD, STEMI s/p stent. Not endorsing chest pain. Followed serial troponins. Cardiac monitoring. Lipid panel and A1C ordered and are within normal limits. Cardiology consult deferred.   #tobacco abuse Nicotine patch ordered  # neuropathy Continue gabapentin  # hypertension Continue home meds   # depression Zoloft   #chronic substance abuse  LCSW Monica aware that family would like services at discharge for substance abuse  #DVT prophylaxis SCDs, he is thrombocytopenic   All the records are reviewed and case is discussed with Care Management/Social Worker. Management plans discussed with the patient and/or family and they are in agreement.  CODE STATUS: Full Code  TOTAL TIME TAKING CARE OF THIS PATIENT: 30 minutes.   More than  50% of the time was spent in counseling/coordination of care: YES  POSSIBLE D/C IN 2-3 DAYS, DEPENDING ON CLINICAL CONDITION.   Ardath Lepak PA-C on 01/09/2019 at 3:12 PM  Between 7am to 6pm - Pager - [phone]  After 6 pm go to [email or web address] - Proofreader  [facility]  Office  [phone]  CC: Primary care physician; [place]  Note: This dictation was prepared with Dragon dictation along with smaller phrase technology. Any transcriptional errors that result from this process are unintentional.

## 2019-01-09 NOTE — Plan of Care (Signed)

## 2019-01-09 NOTE — Progress Notes (Signed)
MD notified of low potassium and low plt count. Also made aware of pt frequent stools.

## 2019-01-09 NOTE — Consult Note (Addendum)
PULMONARY / CRITICAL CARE MEDICINE  Name: Ryan Travis MRN: [id number] DOB: Jul 02, 1941    LOS: 1  Referring Provider:  Dr Atha Starks Reason for Referral: Acute delirium  Brief patient description:  78 y/o male admitted with alcohol intoxication,  alcoholic ketoacidoses and E-coli UTI; transferred to the ICU today for acute delirium  HPI:  This is a 78 y/o male with a medical history as indicated below who was admitted on 01/08/2019 with acute alcohol intoxication, acute alcoholic ketoacidosis and found to have a urinary tract infection.  His urine culture was positive for E. coli.  He was admitted to the floor and started on broad-spectrum antibiotics and IV fluids.  This morning, he became acutely agitated hence he was transferred to the ICU.  The ICU he was given Librium, lorazepam and clonidine and since then he has been sleeping.  He is arousable but goes right back to sleep.  His CT head was negative and his vital signs are stable.  Past Medical History:  Diagnosis Date  . Alcohol abuse   . Anxiety   . Arthritis   . 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 Cancer ([place])    multiple myeloma  . Depression   . Dysrhythmia   . Fall    3 weeks ago/ torn ligaments left knee/wearing brace/pt  . Hypertension   . Neuropathy   . Pain    chronic leg and ankle pain  . RLS (restless legs syndrome)    Past Surgical History:  Procedure Laterality Date  . BRAIN SURGERY     subdural hematoma  . CARDIAC CATHETERIZATION N/A 10/16/2016   Procedure: Left Heart Cath and Coronary Angiography;  Surgeon: Nelva Bush, MD;  Location: [place] [facility];  Service: Cardiovascular;  Laterality: N/A;  . CARDIAC CATHETERIZATION N/A 10/16/2016   Procedure: Coronary Stent Intervention;  Surgeon: Nelva Bush, MD;  Location: [place] [facility];  Service: Cardiovascular;  Laterality: N/A;  . CARDIAC CATHETERIZATION N/A 10/16/2016   Procedure: Temporary Pacemaker;  Surgeon: Nelva Bush, MD;  Location:  [place] [facility];  Service: Cardiovascular;  Laterality: N/A;  . CATARACT EXTRACTION W/PHACO Right 08/16/2016   Procedure: CATARACT EXTRACTION PHACO AND INTRAOCULAR LENS PLACEMENT (IOC);  Surgeon: Eulogio Bear, MD;  Location: [facility];  Service: Ophthalmology;  Laterality: Right;  Lot # [id number] H [place]: 01:00.8 AP%:16.5 CDE: 10.02   . CATARACT EXTRACTION W/PHACO Left 10/04/2016   Procedure: CATARACT EXTRACTION PHACO AND INTRAOCULAR LENS PLACEMENT (IOC);  Surgeon: Eulogio Bear, MD;  Location: [facility];  Service: Ophthalmology;  Laterality: Left;  [place] 35.4 AP% 10.5 CDE 4.11 Fluid pack lot # [id number]  . EYE SURGERY    . FRACTURE SURGERY     multiple right ankle  . HERNIA REPAIR    . JOINT REPLACEMENT Bilateral    thr  . KNEE ARTHROSCOPY W/ ACL RECONSTRUCTION     left x 2  . rcr     Prior to Admission medications   Medication Sig Start Date End Date Taking? Authorizing Provider  amLODipine (NORVASC) 5 MG tablet Take 5 mg by mouth daily.   Yes [provider]  clopidogrel (PLAVIX) 75 MG tablet Take 75 mg by mouth daily.   Yes [provider]  donepezil (ARICEPT) 5 MG tablet Take 1 tablet (5 mg total) by mouth at bedtime. 06/30/18 08/09/18 Yes Sowles, Drue Stager, MD  empagliflozin (JARDIANCE) 25 MG TABS tablet Take 25 mg by mouth daily.   Yes [provider]  glycopyrrolate (ROBINUL)  1 MG tablet Take 1 mg by mouth 2 (two) times daily.   Yes [provider]  insulin aspart (NOVOLOG FLEXPEN) 100 UNIT/ML FlexPen Inject 12 Units into the skin 2 (two) times daily.   Yes [provider]  insulin aspart (NOVOLOG) 100 UNIT/ML FlexPen Inject 18 Units into the skin daily. At 1700   Yes [provider]  Insulin Degludec-Liraglutide (XULTOPHY) 100-3.6 UNIT-MG/ML SOPN Inject 50 Units into the skin daily.   Yes [provider]  levETIRAcetam (KEPPRA) 500 MG tablet Take 500 mg by mouth 2 (two) times daily.   Yes [provider]   lipase/protease/amylase (CREON) 12000 units CPEP capsule Take 6,000 Units by mouth 3 (three) times daily before meals.   Yes [provider]  lipase/protease/amylase (CREON) 12000 units CPEP capsule Take 3,000 Units by mouth at bedtime. With snack   Yes [provider]  lisinopril (PRINIVIL,ZESTRIL) 5 MG tablet Take 5 mg by mouth daily.   Yes [provider]  metoprolol succinate (TOPROL-XL) 25 MG 24 hr tablet Take 1 tablet (25 mg total) by mouth daily. 06/30/18  Yes Sowles, Drue Stager, MD  rosuvastatin (CRESTOR) 40 MG tablet Take 1 tablet (40 mg total) by mouth daily. 06/30/18 08/09/18 Yes Steele Sizer, MD  aspirin EC 81 MG tablet Take 81 mg by mouth daily.    [provider]  famotidine (PEPCID) 20 MG tablet Take 1 tablet (20 mg total) by mouth 2 (two) times daily. 06/30/18 07/30/18  Steele Sizer, MD  gabapentin (NEURONTIN) 300 MG capsule Take 1 capsule (300 mg total) by mouth 2 (two) times daily. 06/30/18 07/30/18  Steele Sizer, MD  insulin glargine (LANTUS) 100 UNIT/ML injection Inject 0.1 mLs (10 Units total) into the skin daily. 06/30/18 07/30/18  Steele Sizer, MD  lacosamide 100 MG TABS Take 1 tablet (100 mg total) by mouth 2 (two) times daily. Patient not taking: Reported on 08/09/2018 11/15/17   Fritzi Mandes, MD  promethazine (PHENERGAN) 12.5 MG tablet Take 1 tablet (12.5 mg total) by mouth every 6 (six) hours as needed for nausea or vomiting. Patient not taking: Reported on 08/09/2018 09/03/17   Stark Klein, MD  sertraline (ZOLOFT) 25 MG tablet Take 1 tablet (25 mg total) by mouth daily. Patient not taking: Reported on 08/09/2018 06/30/18   Steele Sizer, MD   Allergies Allergies  Allergen Reactions  . Azithromycin Other (See Comments)    Other reaction(s): Unknown   . Morphine And Related   . Neomycin   . Other     METHIOLATE  . Penicillins     Did it involve swelling of the face/tongue/throat, SOB, or low BP? Yes Did it involve sudden or  severe rash/hives, skin peeling, or any reaction on the inside of your mouth or nose? Yes Did you need to seek medical attention at a hospital or doctor's office? Yes When did it last happen? 10 years If all above answers are "NO", may proceed with cephalosporin use.    Family History No family history on file. Social History  reports that he has quit smoking. His smoking use included cigarettes. He smoked 0.25 packs per day. He has never used smokeless tobacco. He reports that he does not drink alcohol or use drugs.  Review Of Systems: Unable to obtain as patient is somnolent  VITAL SIGNS: BP 100/73 (BP Location: Left Arm)   Pulse 79   Temp (!) 97.3 F (36.3 C) (Axillary)   Resp 15   Ht 5' 8" (1.727 m)  Wt 84.4 kg   SpO2 99%   BMI 28.28 kg/m   HEMODYNAMICS:    VENTILATOR SETTINGS:    INTAKE / OUTPUT: I/O last 3 completed shifts: In: 5842.4 [P.O.:240; I.V.:2100.1; IV Piggyback:3502.3] Out: 3251 [Urine:3250; Stool:1]  PHYSICAL EXAMINATION: General: No acute distress HEENT: PERRLA, trachea midline, no JVD Neuro: Awakens to noxious stimulus, unable to follow commands Cardiovascular: Apical pulse regular, S1-S2, no murmur regurg or gallop, +2 pulses bilaterally, right lower extremity edema Lungs: Bilateral breath sounds without any wheezes or rhonchi Abdomen: Normal bowel sounds in all 4 quadrants, palpation reveals no organomegaly Musculoskeletal: Positive range of motion, no joint deformities Skin: Warm and dry, venous stasis discoloration in bilateral lower extremities  LABS:  BMET Recent Labs  Lab 01/08/19 1818 01/09/19 0322 01/09/19 1809  NA 135 134* 131*  K 3.5 2.8* 3.6  CL 101 95* 96*  CO2 25 28 27  BUN 15 10 13  CREATININE 0.67 0.55* 0.63  GLUCOSE 138* 146* 132*    Electrolytes Recent Labs  Lab 01/08/19 1032  01/08/19 1818 01/09/19 0322 01/09/19 1809  CALCIUM 7.7*   < > 7.5* 8.1* 8.0*  MG 1.5*  --   --  1.3*  --   PHOS 1.5*  --   --   --    --    < > = values in this interval not displayed.    CBC Recent Labs  Lab 01/08/19 0511 01/09/19 0322  WBC 9.3 6.4  HGB 13.6 12.3*  HCT 39.5 34.7*  PLT 41* 29*    Coag's No results for input(s): APTT, INR in the last 168 hours.  Sepsis Markers Recent Labs  Lab 01/08/19 1224 01/08/19 1526 01/08/19 1818  LATICACIDVEN 4.7* 3.8* 1.5    ABG No results for input(s): PHART, PCO2ART, PO2ART in the last 168 hours.  Liver Enzymes Recent Labs  Lab 01/08/19 0511  AST 74*  ALT 28  ALKPHOS 79  BILITOT 1.3*  ALBUMIN 3.9    Cardiac Enzymes Recent Labs  Lab 01/08/19 1032 01/08/19 1430 01/08/19 2215  TROPONINI 0.03* 0.03* 0.03*    Glucose Recent Labs  Lab 01/08/19 2045 01/09/19 0747 01/09/19 0916 01/09/19 1116 01/09/19 1628 01/09/19 2138  GLUCAP 140* 107* 125* 191* 153* 107*    Imaging Ct Head Wo Contrast  Result Date: 01/09/2019 CLINICAL DATA:  Altered mental status EXAM: CT HEAD WITHOUT CONTRAST TECHNIQUE: Contiguous axial images were obtained from the base of the skull through the vertex without intravenous contrast. COMPARISON:  06/17/2018 FINDINGS: Brain: No evidence of acute infarction, hemorrhage, hydrocephalus, extra-axial collection or mass lesion/mass effect. Remote right inferior cerebellar infarct. Moderate generalized atrophy with mild small vessel ischemic change in the supratentorial white matter Vascular: Atherosclerotic calcification Skull: Left parietal burr holes. No acute or aggressive finding. Frontal scalp lipoma that is right para median Sinuses/Orbits: Cataract resection on both sides. IMPRESSION: 1. No acute finding or change from prior. 2. Atrophy and remote right cerebellar infarct. Electronically Signed   By: Jonathon  Watts M.D.   On: 01/09/2019 08:45   STUDIES:  None  CULTURES: Cultures pending Urine culture positive for E. coli  ANTIBIOTICS: Ciprofloxacin discontinued Patient started on ceftriaxone  SIGNIFICANT  EVENTS: 01/09/2019: Transferred to the ICU 01/08/2019: Admitted  LINES/TUBES: Peripheral line  DISCUSSION: 77-year-old male admitted with E. coli UTI, alcohol intoxication, alcoholic ketoacidosis and acute delirium secondary to alcohol intoxication and possible alcohol withdrawal  ASSESSMENT  E. coli UTI Alcoholic ketoacidosis Acute alcohol intoxication Acute delirium secondary to EtOH withdrawal Hypokalemia   Hypomagnesemia  PLAN Trend lactic acid IV fluids Thiamine, folic acid and multivitamin Monitor and correct electrolytes Continue CIWA protocol with PRN lorazepam and as needed Librium Discontinue Keflex and start ceftriaxone for UTI Follow-up blood cultures Rest of the treatment plan remains unchanged  Best Practice: Code Status: Full code Diet: N.p.o. until mental status improves GI prophylaxis: Not indicated VTE prophylaxis: Subcu heparin and SCDs  FAMILY  - Updates: Family at bedside.  Will update when available Magdalene S. Tukov ANP-BC Pulmonary and Critical Care Medicine [facility] Pager [phone] or [phone]  NB: This document was prepared using Dragon voice recognition software and may include unintentional dictation errors.    01/09/2019, 10:00 PM 

## 2019-01-09 NOTE — Evaluation (Signed)
Clinical/Bedside Swallow Evaluation Patient Details  Name: Ryan Travis MRN: [id number] Date of Birth: 03/06/1941  Today's Date: 01/09/2019 Time: SLP Start Time (ACUTE ONLY): 0930 SLP Stop Time (ACUTE ONLY): 1020 SLP Time Calculation (min) (ACUTE ONLY): 50 min  Past Medical History:  Past Medical History:  Diagnosis Date  . Alcohol abuse   . Anxiety   . Arthritis   . 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 Cancer ([place])    multiple myeloma  . Depression   . Dysrhythmia   . Fall    3 weeks ago/ torn ligaments left knee/wearing brace/pt  . Hypertension   . Neuropathy   . Pain    chronic leg and ankle pain  . RLS (restless legs syndrome)    Past Surgical History:  Past Surgical History:  Procedure Laterality Date  . BRAIN SURGERY     subdural hematoma  . CARDIAC CATHETERIZATION N/A 10/16/2016   Procedure: Left Heart Cath and Coronary Angiography;  Surgeon: Nelva Bush, MD;  Location: [place] [facility];  Service: Cardiovascular;  Laterality: N/A;  . CARDIAC CATHETERIZATION N/A 10/16/2016   Procedure: Coronary Stent Intervention;  Surgeon: Nelva Bush, MD;  Location: [place] [facility];  Service: Cardiovascular;  Laterality: N/A;  . CARDIAC CATHETERIZATION N/A 10/16/2016   Procedure: Temporary Pacemaker;  Surgeon: Nelva Bush, MD;  Location: [place] [facility];  Service: Cardiovascular;  Laterality: N/A;  . CATARACT EXTRACTION W/PHACO Right 08/16/2016   Procedure: CATARACT EXTRACTION PHACO AND INTRAOCULAR LENS PLACEMENT (IOC);  Surgeon: Eulogio Bear, MD;  Location: [facility];  Service: Ophthalmology;  Laterality: Right;  Lot # [id number] H [place]: 01:00.8 AP%:16.5 CDE: 10.02   . CATARACT EXTRACTION W/PHACO Left 10/04/2016   Procedure: CATARACT EXTRACTION PHACO AND INTRAOCULAR LENS PLACEMENT (IOC);  Surgeon: Eulogio Bear, MD;  Location: [facility];  Service: Ophthalmology;  Laterality: Left;  [place] 35.4 AP% 10.5 CDE 4.11 Fluid pack lot # [id number]  . EYE SURGERY     . FRACTURE SURGERY     multiple right ankle  . HERNIA REPAIR    . JOINT REPLACEMENT Bilateral    thr  . KNEE ARTHROSCOPY W/ ACL RECONSTRUCTION     left x 2  . rcr     HPI:  Pt is a 78 y.o. male with a known current ETOH abuse and history of multiple myeloma in remission, hypertension, CAD, STEMI s/p stent, depression, and polysubstance abuse who presented to the ED "feeling like death was at my doorstep, like I was going to die". Wife called 86 yesterday morning because he was having shortness of breath, received 1 DuoNeb treatment and refused to transfer to the hospital. Onset of symptoms after drinking a pint of vodka. Persistent. Described as dizziness, weakness, shortness of breath however this is chronic. No chest pain, abdominal pain, fall (though he had one 2 weeks ago), or loss of consciousness. Associated with nausea and unknown number of episodes of non-bloody emesis though he states this is a daily occurrence. He drinks 1 pint of vodka daily. Smokes about 1/2 pack of cigarettes a day. Endorses marijuana use without being asked. 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 Historical care at the [place].    Assessment / Plan / Recommendation Clinical Impression  Pt appears to present w/ fairly adequate oropharyngeal phase swallowing function w/ no immediate, overt s/s of aspiration noted during po trials; no decline in O2 sats (97%) or HR/RR during session. Pt appears to present w/ Cognitive decline/Confusion at  this time - unsure if related to his ETOH abuse and CIWA. Pt consumed trials of thin liquids via Cup, ice chips, purees, and semi-solids moistened. Pt consumed trials w/ no decline in vocal quality or respiratory status. Oral phase appeared grossly [facility] for bolus management, mastication of trials given, and oral clearing post swallowing. He needed min extra time during the oral phase for full mastication and oral clearing - suspect min decreased awareness d/t Cognitive status.  Pt fed self w/ min support and prep. OM exam revealed no unilateral weakness. Recommend a more Mech Soft diet at this time d/t Cognitive status(cut meats); Thin liquids via Cup/straw. Recommend general aspiration precautions; Pills in Puree if needed for easier, safer swallowing. NSG updated/agreed; precautions posted. ST services will be available for any further needs while admitted.  SLP Visit Diagnosis: Dysphagia, unspecified (R13.10)(Cognitive decline/impact)    Aspiration Risk  Mild aspiration risk(but reduced following general aspiration precautions) - d/t declined Cognitive status currently   Diet Recommendation  Mech Soft/Regular diet - cut meats; Thin liquids. General aspiration precautions and support w/ feeding at meals d/t current Cognitive status/decline.  Medication Administration: Whole meds with puree(as needed for easier, safer swallowing)    Other  Recommendations Recommended Consults: (Dietician f/u) Oral Care Recommendations: Oral care BID;Staff/trained caregiver to provide oral care Other Recommendations: (n/a)   Follow up Recommendations None      Frequency and Duration (n/a)  (n/a)       Prognosis Prognosis for Safe Diet Advancement: Good Barriers to Reach Goals: (Confusion at this time)      Swallow Study   General Date of Onset: 01/08/19 HPI: Pt is a 78 y.o. male with a known current ETOH abuse and history of multiple myeloma in remission, hypertension, CAD, STEMI s/p stent, depression, and polysubstance abuse who presented to the ED "feeling like death was at my doorstep, like I was going to die". Wife called 84 yesterday morning because he was having shortness of breath, received 1 DuoNeb treatment and refused to transfer to the hospital. Onset of symptoms after drinking a pint of vodka. Persistent. Described as dizziness, weakness, shortness of breath however this is chronic. No chest pain, abdominal pain, fall (though he had one 2 weeks ago), or loss of  consciousness. Associated with nausea and unknown number of episodes of non-bloody emesis though he states this is a daily occurrence. He drinks 1 pint of vodka daily. Smokes about 1/2 pack of cigarettes a day. Endorses marijuana use without being asked. 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 Historical care at the [place].  Type of Study: Bedside Swallow Evaluation Previous Swallow Assessment: none reported Diet Prior to this Study: Regular;Thin liquids Temperature Spikes Noted: No(wbc 6.4) Respiratory Status: Room air History of Recent Intubation: No Behavior/Cognition: Alert;Cooperative;Pleasant mood;Confused;Distractible;Requires cueing Oral Cavity Assessment: Within Functional Limits Oral Care Completed by SLP: Recent completion by staff Oral Cavity - Dentition: Adequate natural dentition Vision: Functional for self-feeding Self-Feeding Abilities: Able to feed self;Needs assist;Needs set up;Total assist(confusion+) Patient Positioning: Upright in bed(needed positioning) Baseline Vocal Quality: Normal(adequate) Volitional Cough: Cognitively unable to elicit Volitional Swallow: Unable to elicit    Oral/Motor/Sensory Function Overall Oral Motor/Sensory Function: Within functional limits(w/ oral movements followed through; clearing mouth)   Ice Chips Ice chips: Within functional limits Presentation: Spoon(fed; 2 trials)   Thin Liquid Thin Liquid: Within functional limits Presentation: Cup;Self Fed;Straw(~8 ozs total)    Nectar Thick Nectar Thick Liquid: Not tested   Honey Thick Honey Thick Liquid:  Not tested   Puree Puree: Within functional limits Presentation: Self Fed;Spoon(5 trials) Other Comments: assisted   Solid     Solid: Impaired Presentation: Self Fed;Spoon(5 trials) Oral Phase Impairments: Impaired mastication;Poor awareness of bolus(min) Oral Phase Functional Implications: Prolonged oral transit;Impaired mastication(min) Pharyngeal Phase Impairments:  (none) Other Comments: needed assistance       Orinda Kenner, MS, CCC-SLP Watson,Katherine 01/09/2019,2:15 PM

## 2019-01-09 NOTE — Progress Notes (Signed)
Pharmacy Electrolyte Monitoring Consult:  Pharmacy consulted to assist in monitoring and replacing electrolytes in this 78 y.o. male admitted on 01/08/2019 with Respiratory Distress   Labs:  Sodium (mmol/L)  Date Value  01/09/2019 134 (L)   Potassium (mmol/L)  Date Value  01/09/2019 2.8 (L)   Magnesium (mg/dL)  Date Value  01/09/2019 1.3 (L)   Phosphorus (mg/dL)  Date Value  01/08/2019 1.5 (L)   Calcium (mg/dL)  Date Value  01/09/2019 8.1 (L)   Albumin (g/dL)  Date Value  01/08/2019 3.9    Assessment/Plan: Patient currently on CIWA. Patient received potassium 98mEq Q1hr x 4 doses and magnesium 2g IV x 1. Will order potassium 40mEq POx 1 and magnesium 2g (for total of 4g ) x 1.   Per ICU rounds will check BMP at 1800.   Will obtain BMP/Magnesium with am labs.   While on CIWA will replace for goal magnesium ~ 2 and to maintain other electrolytes within normal limits.   Pharmacy will continue to monitor and adjust per consult.   Simpson,Michael L 01/09/2019 1:16 PM

## 2019-01-10 LAB — URINE CULTURE: Culture: >=100000 — AB

## 2019-01-10 LAB — BASIC METABOLIC PANEL
Anion gap: 8 (ref 5–15)
BUN: 11 mg/dL (ref 8–23)
CO2: 28 mmol/L (ref 22–32)
Calcium: 8.2 mg/dL — ABNORMAL LOW (ref 8.9–10.3)
Chloride: 99 mmol/L (ref 98–111)
Creatinine, Ser: 0.75 mg/dL (ref 0.61–1.24)
GFR calc Af Amer: >60 mL/min (ref >60)
GFR calc non Af Amer: >60 mL/min (ref >60)
Glucose, Bld: 98 mg/dL (ref 70–99)
Potassium: 3.2 mmol/L — ABNORMAL LOW (ref 3.5–5.1)
Sodium: 135 mmol/L (ref 135–145)

## 2019-01-10 LAB — CBC
HCT: 41.4 % (ref 39.0–52.0)
Hemoglobin: 14.3 g/dL (ref 13.0–17.0)
MCH: 36.1 pg — ABNORMAL HIGH (ref 26.0–34.0)
MCHC: 34.5 g/dL (ref 30.0–36.0)
MCV: 104.5 fL — ABNORMAL HIGH (ref 80.0–100.0)
Platelets: 28 10*3/uL — CL (ref 150–400)
RBC: 3.96 MIL/uL — ABNORMAL LOW (ref 4.22–5.81)
RDW: 14.4 % (ref 11.5–15.5)
WBC: 5.2 10*3/uL (ref 4.0–10.5)
nRBC: 0 % (ref 0.0–0.2)

## 2019-01-10 LAB — GLUCOSE, CAPILLARY
GLUCOSE-CAPILLARY: 142 mg/dL — AB (ref 70–99)
Glucose-Capillary: 97 mg/dL (ref 70–99)
Glucose-Capillary: 93 mg/dL (ref 70–99)
Glucose-Capillary: 119 mg/dL — ABNORMAL HIGH (ref 70–99)

## 2019-01-10 LAB — PHOSPHORUS
Phosphorus: 1.1 mg/dL — ABNORMAL LOW (ref 2.5–4.6)
Phosphorus: 3 mg/dL (ref 2.5–4.6)

## 2019-01-10 LAB — POTASSIUM: Potassium: 3.5 mmol/L (ref 3.5–5.1)

## 2019-01-10 LAB — MAGNESIUM: Magnesium: 2 mg/dL (ref 1.7–2.4)

## 2019-01-10 MED ORDER — LACTATED RINGERS IV SOLN
INTRAVENOUS | Status: DC
  Administered 2019-01-10: 05:00:00 via INTRAVENOUS

## 2019-01-10 MED ORDER — ALBUTEROL SULFATE (2.5 MG/3ML) 0.083% IN NEBU: 2.5000 mg | INHALATION_SOLUTION | RESPIRATORY_TRACT | Status: DC | PRN

## 2019-01-10 MED ORDER — K PHOS MONO-SOD PHOS DI & MONO 155-852-130 MG PO TABS
500.0000 mg | ORAL_TABLET | Freq: Three times a day (TID) | ORAL | Status: DC
  Administered 2019-01-10 (×3): 500 mg via ORAL
  Filled 2019-01-10 (×4): qty 2

## 2019-01-10 MED ORDER — SODIUM CHLORIDE 0.9 % IV SOLN
2.0000 g | INTRAVENOUS | Status: DC
  Administered 2019-01-10 – 2019-01-12 (×3): 2 g via INTRAVENOUS
  Filled 2019-01-10 (×3): qty 2

## 2019-01-10 MED ORDER — CHLORDIAZEPOXIDE HCL 5 MG PO CAPS: 25.0000 mg | ORAL_CAPSULE | Freq: Three times a day (TID) | ORAL | Status: DC | PRN

## 2019-01-10 MED ORDER — POTASSIUM PHOSPHATES 15 MMOLE/5ML IV SOLN
30.0000 mmol | Freq: Once | INTRAVENOUS | Status: AC
  Administered 2019-01-10: 12:00:00 30 mmol via INTRAVENOUS
  Filled 2019-01-10: qty 10

## 2019-01-10 MED ORDER — HEPARIN SODIUM (PORCINE) 5000 UNIT/ML IJ SOLN
5000.0000 [IU] | Freq: Three times a day (TID) | INTRAMUSCULAR | Status: DC
  Administered 2019-01-10: 5000 [IU] via SUBCUTANEOUS
  Filled 2019-01-10: qty 1

## 2019-01-10 MED ORDER — IPRATROPIUM-ALBUTEROL 0.5-2.5 (3) MG/3ML IN SOLN
3.0000 mL | Freq: Three times a day (TID) | RESPIRATORY_TRACT | Status: DC
  Administered 2019-01-10: 13:00:00 3 mL via RESPIRATORY_TRACT
  Filled 2019-01-10: qty 3

## 2019-01-10 NOTE — Progress Notes (Signed)
Pharmacy Electrolyte Monitoring Consult:  Pharmacy consulted to assist in monitoring and replacing electrolytes in this 78 y.o. male admitted on 01/08/2019 with Respiratory Distress   Labs:  Sodium (mmol/L)  Date Value  01/10/2019 135   Potassium (mmol/L)  Date Value  01/10/2019 3.2 (L)   Magnesium (mg/dL)  Date Value  01/10/2019 2.0   Phosphorus (mg/dL)  Date Value  01/10/2019 1.1 (L)   Calcium (mg/dL)  Date Value  01/10/2019 8.2 (L)   Albumin (g/dL)  Date Value  01/08/2019 3.9    Assessment/Plan: Patient currently on CIWA.  Ordered Potassium Phosphate 67mmol IV x 1 dose. Recheck K and Phos levels tonight at 19:00.   Will obtain BMP/Mag/Phos with am labs.   While on CIWA will replace for goal magnesium ~ 2 and to maintain other electrolytes within normal limits.   Pharmacy will continue to monitor and adjust per consult.   Olivia Canter, [facility] 01/10/2019 9:51 AM

## 2019-01-10 NOTE — Progress Notes (Signed)
CRITICAL VALUE ALERT  Critical Value: 28  Date & Time Notied:01/10/19 05:55  Provider Notified: Leonides Grills, NP  Orders Received/Actions taken: acknowledged

## 2019-01-10 NOTE — Progress Notes (Signed)
Pharmacy Antibiotic Note  Ryan Travis is a 78 y.o. male admitted on 01/08/2019 with UTI.  Pharmacy has been consulted for cefriaxone dosing.  Plan: Ceftriaxone 2 grams q 24 hours ordered.  Height: 5\' 8"  (172.7 cm) Weight: 186 lb (84.4 kg) IBW/kg (Calculated) : 68.4  Temp (24hrs), Avg:97.5 F (36.4 C), Min:96.8 F (36 C), Max:98.5 F (36.9 C)  Recent Labs  Lab 01/08/19 0511 01/08/19 0740 01/08/19 1032 01/08/19 1036 01/08/19 1224 01/08/19 1430 01/08/19 1526 01/08/19 1818 01/09/19 0322 01/09/19 1809  WBC 9.3  --   --   --   --   --   --   --  6.4  --   CREATININE 0.80  --  0.62  --   --  0.60*  --  0.67 0.55* 0.63  LATICACIDVEN  --  5.2*  --  5.1* 4.7*  --  3.8* 1.5  --   --     Estimated Creatinine Clearance: 83.1 mL/min (by C-G formula based on SCr of 0.63 mg/dL).    Allergies  Allergen Reactions  . Azithromycin Other (See Comments)    Other reaction(s): Unknown   . Morphine And Related   . Neomycin   . Other     METHIOLATE  . Penicillins     Did it involve swelling of the face/tongue/throat, SOB, or low BP? Yes Did it involve sudden or severe rash/hives, skin peeling, or any reaction on the inside of your mouth or nose? Yes Did you need to seek medical attention at a hospital or doctor's office? Yes When did it last happen? 10 years If all above answers are "NO", may proceed with cephalosporin use.    Antimicrobials this admission: cephalexin 3/5  >> ceftriaxone 3/7   >>   Dose adjustments this admission:   Microbiology results: 3/5 BCx: NG 24h 3/5 UCx: >100K E. coli  3/5 MRSA PCR: (-)      3/5 UA LE(tr)  NO2(+)  WBC 21-50 Thank you for allowing pharmacy to be a part of this patient's care.  Marylouise Mallet S 01/10/2019 4:26 AM

## 2019-01-10 NOTE — Progress Notes (Signed)
[place] at [place] NAME: Ryan Travis    MR#:  [id number]  DATE OF BIRTH:  10/28/1941  SUBJECTIVE:  CHIEF COMPLAINT:   Chief Complaint  Patient presents with  . Respiratory Distress    No new complaint this morning.  Initially had some mild wheezing which resolved with nebulizer treatment.  Currently no wheezing.  No chest pain.  No shortness of breath.  No agitation.  No confusion.  Being transferred out of ICU today.  REVIEW OF SYSTEMS:  Review of Systems  Constitutional: Negative for chills, fever and weight loss.  HENT: Negative for hearing loss and tinnitus.   Eyes: Negative for blurred vision and double vision.  Respiratory: Negative for cough, hemoptysis and shortness of breath.   Cardiovascular: Negative for chest pain, palpitations and orthopnea.  Gastrointestinal: Negative for heartburn, nausea and vomiting.  Genitourinary: Negative for dysuria, frequency and urgency.  Musculoskeletal: Negative for myalgias and neck pain.  Skin: Negative for itching and rash.  Neurological: Negative for dizziness and headaches.  Psychiatric/Behavioral: Negative for depression and hallucinations.    DRUG ALLERGIES:   Allergies  Allergen Reactions  . Azithromycin Other (See Comments)    Other reaction(s): Unknown   . Morphine And Related   . Neomycin   . Other     METHIOLATE  . Penicillins     Did it involve swelling of the face/tongue/throat, SOB, or low BP? Yes Did it involve sudden or severe rash/hives, skin peeling, or any reaction on the inside of your mouth or nose? Yes Did you need to seek medical attention at a hospital or doctor's office? Yes When did it last happen? 10 years If all above answers are "NO", may proceed with cephalosporin use.   VITALS:  Blood pressure 114/75, pulse (!) 102, temperature 98.3 F (36.8 C), temperature source Axillary, resp. rate (!) 23, height _0  (1.727 m), weight 84.4 kg, SpO2 99  %. PHYSICAL EXAMINATION:  Physical Exam  GENERAL:78 y.o.-year-old patient lying in the bed with acute agitation. EYES: Pupils equal, round, reactive to light and accommodation. No scleral icterus. Extraocular muscles intact.  HEENT: Head atraumatic, normocephalic. Oropharynx and nasopharynx clear.  NECK: Supple, no jugular venous distention. No thyroid enlargement, no tenderness.  LUNGS: Normal breath sounds bilaterally anteriorly, no wheezing, rales,rhonchi or crepitation. No use of accessory muscles of respiration.Increased work of breathing with conversation. CARDIOVASCULAR: S1, S2 normal. No murmurs, rubs, or gallops.  ABDOMEN: Soft, nontender, nondistended. Bowel sounds present. No organomegaly or mass.  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 + pain to palpation in the ankles, knees L>R.No, cyanosis, or clubbing.Distal pulses 2+ NEUROLOGIC: Cranial nerves II through XII are intact. Muscle strength 5/5 in all extremities. Sensation intact. Gait not checked.  PSYCHIATRIC: The patient is in acute withdrawal with agitation.  SKIN: No obvious rash, lesion, or ulcer otherwise.   LABORATORY PANEL:  Male CBC Recent Labs  Lab 01/10/19 0418  WBC 5.2  HGB 14.3  HCT 41.4  PLT 28*   ------------------------------------------------------------------------------------------------------------------ Chemistries  Recent Labs  Lab 01/08/19 0511  01/10/19 0609  NA 143   < > 135  K 4.1   < > 3.2*  CL 103   < > 99  CO2 21*   < > 28  GLUCOSE 58*   < > 98  BUN 21   < > 11  CREATININE 0.80   < >  0.75  CALCIUM 8.1*   < > 8.2*  MG  --    < > 2.0  AST 74*  --   --   ALT 28  --   --   ALKPHOS 79  --   --   BILITOT 1.3*  --   --    < > = values in this interval not displayed.   RADIOLOGY:  No results found. ASSESSMENT AND PLAN:   SherrillPerryis a78 y.o.maleveteran follows at  [facility] with a known history of multiple myeloma in remission, hypertension, CAD, STEMI s/p stent, depression, and polysubstance abuse who presented to the ED "feeling like death was at my doorstep, like I was going to die". Symptoms after drinking a pint of vodka. Described as dizziness, weakness, shortness of breath however this is chronic.   Patient was actively intoxicated with elevated ethanol level in the ED. BS 58. + anion gap. Thrombocytopenic. Admitted to the hospitalist service for alcoholic ketoacidosis with acute intoxication. Sepsis due to UTI, UA + nitrite + protein + ketones. CIWA protocol initiated and initially admitted to the ICU.  Clinically stable and transferred out of ICU on 99/24/2683.  1. Alcoholic ketoacidosis Resolved with IV fluid hydration. Hypokalemia and hypomagnesemia; replaced Hypophosphatemia; being replaced.  Follow-up on repeat levels in a.m.  2.Dysphagia Was previously npo and seen by speech therapy for evaluation of difficulty swallowing. Diet recommendation given and orders placed- mech soft/regular with aspiration precautions and patient tolerating well   3.  Respiratory distress Resolved with nebulizer treatment.  Clinically stable at this time  4.Alcohol abuse with acute withdrawal CIWA protocol. Continue to assess and counsel as he stabilizes. Transferred to ICU 01/09/2019 acute altered mental status. CT head 01/09/2019 negative for acute process.   Significantly improved clinically.  Has not required any Ativan this morning.  Awake and alert and oriented.  Being transferred out of ICU today.  5. UTI, sepsis secondary to UTI Patient noted to be on IV Rocephin.  Urine culture growing E. coli .so far no growth on blood cultures.   Clinically improved   6. Elevated troponin Elevated to 0.04 initially, down to 0.03. Likely demand ischemia from sepsis, UTI. He has a history of CAD, STEMI s/p stent. Not endorsing chest pain  7.Tobacco abuse Nicotine  patch ordered  8 neuropathy Continue gabapentin  9 hypertension Continue home meds   10. depression Zoloft   11.chronic substance abuse  LCSW Monica aware that family would like services at discharge for substance abuse  12.  Thrombocytopenia; present on admission Platelet count of 28,000 today.. No bleeding. Subacute heparin discontinued.  CBC in a.m.  #DVT prophylaxisSCDs, he is thrombocytopenic   All the records are reviewed and case discussed with Care Management/Social Worker. Management plans discussed with the patient, family and they are in agreement.  CODE STATUS: Full Code  TOTAL TIME TAKING CARE OF THIS PATIENT: 37 minutes.   More than 50% of the time was spent in counseling/coordination of care: YES  POSSIBLE D/C IN 2 DAYS, DEPENDING ON CLINICAL CONDITION.   Ivadell Gaul M.D on 01/10/2019 at 11:23 AM  Between 7am to 6pm - Pager - [phone]  After 6pm go to [email or web address] - Proofreader  [facility] [place] [facility]  Office  [phone]  CC: Primary care physician; [place]  Note: This dictation was prepared with Dragon dictation along with smaller phrase technology. Any transcriptional errors that result from this process are unintentional.

## 2019-01-11 LAB — CBC
HCT: 32.5 % — ABNORMAL LOW (ref 39.0–52.0)
Hemoglobin: 11.1 g/dL — ABNORMAL LOW (ref 13.0–17.0)
MCH: 36.9 pg — ABNORMAL HIGH (ref 26.0–34.0)
MCHC: 34.2 g/dL (ref 30.0–36.0)
MCV: 108 fL — ABNORMAL HIGH (ref 80.0–100.0)
Platelets: 44 10*3/uL — ABNORMAL LOW (ref 150–400)
RBC: 3.01 MIL/uL — ABNORMAL LOW (ref 4.22–5.81)
RDW: 15 % (ref 11.5–15.5)
WBC: 5.1 10*3/uL (ref 4.0–10.5)
nRBC: 0 % (ref 0.0–0.2)

## 2019-01-11 LAB — GLUCOSE, CAPILLARY
Glucose-Capillary: 90 mg/dL (ref 70–99)
Glucose-Capillary: 95 mg/dL (ref 70–99)
Glucose-Capillary: 129 mg/dL — ABNORMAL HIGH (ref 70–99)

## 2019-01-11 LAB — BASIC METABOLIC PANEL
Anion gap: 8 (ref 5–15)
BUN: 19 mg/dL (ref 8–23)
CO2: 30 mmol/L (ref 22–32)
Calcium: 8.2 mg/dL — ABNORMAL LOW (ref 8.9–10.3)
Chloride: 96 mmol/L — ABNORMAL LOW (ref 98–111)
Creatinine, Ser: 1.06 mg/dL (ref 0.61–1.24)
GFR calc Af Amer: >60 mL/min (ref >60)
GFR calc non Af Amer: >60 mL/min (ref >60)
Glucose, Bld: 84 mg/dL (ref 70–99)
Potassium: 3.4 mmol/L — ABNORMAL LOW (ref 3.5–5.1)
Sodium: 134 mmol/L — ABNORMAL LOW (ref 135–145)

## 2019-01-11 LAB — MAGNESIUM: Magnesium: 1.7 mg/dL (ref 1.7–2.4)

## 2019-01-11 LAB — PHOSPHORUS: Phosphorus: 4.1 mg/dL (ref 2.5–4.6)

## 2019-01-11 MED ORDER — POTASSIUM CHLORIDE 20 MEQ PO PACK
40.0000 meq | PACK | Freq: Once | ORAL | Status: AC
  Administered 2019-01-11: 12:00:00 40 meq via ORAL
  Filled 2019-01-11: qty 2

## 2019-01-11 NOTE — Progress Notes (Signed)
[place] at [place] NAME: Ryan Travis    MR#:  [id number]  DATE OF BIRTH:  1941-03-12  SUBJECTIVE:  CHIEF COMPLAINT:   Chief Complaint  Patient presents with  . Respiratory Distress    No new complaint this morning.  Was transferred out of ICU on 01/10/2019.  Resting comfortably this morning.  No chest pain.  No shortness of breath.  No agitation.  No confusion.    REVIEW OF SYSTEMS:  Review of Systems  Constitutional: Negative for chills, fever and weight loss.  HENT: Negative for hearing loss and tinnitus.   Eyes: Negative for blurred vision and double vision.  Respiratory: Negative for cough, hemoptysis and shortness of breath.   Cardiovascular: Negative for chest pain, palpitations and orthopnea.  Gastrointestinal: Negative for heartburn, nausea and vomiting.  Genitourinary: Negative for dysuria, frequency and urgency.  Musculoskeletal: Negative for myalgias and neck pain.  Skin: Negative for itching and rash.  Neurological: Negative for dizziness and headaches.  Psychiatric/Behavioral: Negative for depression and hallucinations.    DRUG ALLERGIES:   Allergies  Allergen Reactions  . Azithromycin Other (See Comments)    Other reaction(s): Unknown   . Morphine And Related   . Neomycin   . Other     METHIOLATE  . Penicillins     Did it involve swelling of the face/tongue/throat, SOB, or low BP? Yes Did it involve sudden or severe rash/hives, skin peeling, or any reaction on the inside of your mouth or nose? Yes Did you need to seek medical attention at a hospital or doctor's office? Yes When did it last happen? 10 years If all above answers are "NO", may proceed with cephalosporin use.   VITALS:  Blood pressure 108/80, pulse 83, temperature 97.7 F (36.5 C), resp. rate 16, height '5\' 8"'  (1.727 m), weight 84.4 kg, SpO2 93 %. PHYSICAL EXAMINATION:  Physical Exam  GENERAL:78 y.o.-year-old patient lying in the bed  with acute agitation. EYES: Pupils equal, round, reactive to light and accommodation. No scleral icterus. Extraocular muscles intact.  HEENT: Head atraumatic, normocephalic. Oropharynx and nasopharynx clear.  NECK: Supple, no jugular venous distention. No thyroid enlargement, no tenderness.  LUNGS: Normal breath sounds bilaterally anteriorly, no wheezing, rales,rhonchi or crepitation. No use of accessory muscles of respiration.Increased work of breathing with conversation. CARDIOVASCULAR: S1, S2 normal. No murmurs, rubs, or gallops.  ABDOMEN: Soft, nontender, nondistended. Bowel sounds present. No organomegaly or mass.  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 + pain to palpation in the ankles, knees L>R.No, cyanosis, or clubbing.Distal pulses 2+ NEUROLOGIC: Cranial nerves II through XII are intact. Muscle strength 5/5 in all extremities. Sensation intact. Gait not checked.  PSYCHIATRIC: The patient is in acute withdrawal with agitation.  SKIN: No obvious rash, lesion, or ulcer otherwise.   LABORATORY PANEL:  Male CBC Recent Labs  Lab 01/11/19 0507  WBC 5.1  HGB 11.1*  HCT 32.5*  PLT 44*   ------------------------------------------------------------------------------------------------------------------ Chemistries  Recent Labs  Lab 01/08/19 0511  01/11/19 0507  NA 143   < > 134*  K 4.1   < > 3.4*  CL 103   < > 96*  CO2 21*   < > 30  GLUCOSE 58*   < > 84  BUN 21   < > 19  CREATININE 0.80   < > 1.06  CALCIUM 8.1*   < >  8.2*  MG  --    < > 1.7  AST 74*  --   --   ALT 28  --   --   ALKPHOS 79  --   --   BILITOT 1.3*  --   --    < > = values in this interval not displayed.   RADIOLOGY:  No results found. ASSESSMENT AND PLAN:   SherrillPerryis a77 y.o.maleveteran follows at [facility] with a known history of multiple myeloma in remission, hypertension, CAD, STEMI s/p  stent, depression, and polysubstance abuse who presented to the ED "feeling like death was at my doorstep, like I was going to die". Symptoms after drinking a pint of vodka. Described as dizziness, weakness, shortness of breath however this is chronic.   Patient was actively intoxicated with elevated ethanol level in the ED. BS 58. + anion gap. Thrombocytopenic. Admitted to the hospitalist service for alcoholic ketoacidosis with acute intoxication. Sepsis due to UTI, UA + nitrite + protein + ketones. CIWA protocol initiated and initially admitted to the ICU.  Clinically stable and transferred out of ICU on 78/24/2353.  1. Alcoholic ketoacidosis Resolved with IV fluid hydration. Hypokalemia , hypophosphatemia and hypomagnesemia; replaced  2.Dysphagia Was previously npo and seen by speech therapy for evaluation of difficulty swallowing. Diet recommendation given and orders placed- mech soft/regular with aspiration precautions and patient tolerating well   3.  Respiratory distress Resolved with nebulizer treatment previously.  Clinically stable at this time  4.Alcohol abuse with acute withdrawal CIWA protocol. Continue to assess and counsel as he stabilizes. Transferred to ICU 01/09/2019 acute altered mental status. CT head 01/09/2019 negative for acute process.   Significantly improved clinically.  Has not required any Ativan this morning.  Awake and alert and oriented.  Transferred out of ICU on 01/10/2019.  5. UTI, sepsis secondary to UTI Patient noted to be on IV Rocephin.  Urine culture growing E. coli .so far no growth on blood cultures.   Clinically improved   6. Elevated troponin Elevated to 0.04 initially, down to 0.03. Likely demand ischemia from sepsis, UTI. He has a history of CAD, STEMI s/p stent. Not endorsing chest pain  7.Tobacco abuse Nicotine patch ordered  8 neuropathy Continue gabapentin  9 hypertension Continue home meds   10. depression Zoloft   11.chronic  substance abuse  LCSW Monica aware that family would like services at discharge for substance abuse  12.  Thrombocytopenia; present on admission Platelet count improved to 44  No bleeding. Subacute heparin previously discontinued.   #DVT prophylaxisSCDs, he is thrombocytopenic Physical therapist to evaluate and treat with recommendations today.  Disposition; anticipate discharge in a.m. pending discharge recommendations from physical therapist   All the records are reviewed and case discussed with Care Management/Social Worker. Management plans discussed with the patient, family and they are in agreement.  CODE STATUS: Full Code  TOTAL TIME TAKING CARE OF THIS PATIENT: 37 minutes.   More than 50% of the time was spent in counseling/coordination of care: YES  POSSIBLE D/C IN 1 DAYS, DEPENDING ON CLINICAL CONDITION.   Aysen Shieh M.D on 01/11/2019 at 10:58 AM  Between 7am to 6pm - Pager - [phone]  After 6pm go to [email or web address] - Proofreader  [facility] [place] [facility]  Office  [phone]  CC: Primary care physician; [place]  Note: This dictation was prepared with Dragon dictation along with smaller phrase technology. Any transcriptional errors that result from this process are unintentional.

## 2019-01-11 NOTE — Progress Notes (Signed)
Pharmacy Electrolyte Monitoring Consult:  Pharmacy consulted to assist in monitoring and replacing electrolytes in this 78 y.o. male admitted on 01/08/2019 with Respiratory Distress   Labs:  Sodium (mmol/L)  Date Value  01/11/2019 134 (L)   Potassium (mmol/L)  Date Value  01/11/2019 3.4 (L)   Magnesium (mg/dL)  Date Value  01/11/2019 1.7   Phosphorus (mg/dL)  Date Value  01/11/2019 4.1   Calcium (mg/dL)  Date Value  01/11/2019 8.2 (L)   Albumin (g/dL)  Date Value  01/08/2019 3.9    Assessment/Plan: Patient currently on CIWA.  Ordered KCL 45meq PO x 1 dose.   Will obtain BMP/Mag/Phos with am labs.   While on CIWA will replace for goal magnesium ~ 2 and to maintain other electrolytes within normal limits.   Pharmacy will continue to monitor and adjust per consult.   Olivia Canter, [facility] 01/11/2019 7:21 AM

## 2019-01-12 LAB — GLUCOSE, CAPILLARY
GLUCOSE-CAPILLARY: 99 mg/dL (ref 70–99)
Glucose-Capillary: 96 mg/dL (ref 70–99)
Glucose-Capillary: 89 mg/dL (ref 70–99)

## 2019-01-12 LAB — BASIC METABOLIC PANEL
Anion gap: 5 (ref 5–15)
BUN: 22 mg/dL (ref 8–23)
CO2: 28 mmol/L (ref 22–32)
Calcium: 7.9 mg/dL — ABNORMAL LOW (ref 8.9–10.3)
Chloride: 101 mmol/L (ref 98–111)
Creatinine, Ser: 0.73 mg/dL (ref 0.61–1.24)
GFR calc Af Amer: >60 mL/min (ref >60)
GFR calc non Af Amer: >60 mL/min (ref >60)
GLUCOSE: 87 mg/dL (ref 70–99)
Potassium: 3.4 mmol/L — ABNORMAL LOW (ref 3.5–5.1)
Sodium: 134 mmol/L — ABNORMAL LOW (ref 135–145)

## 2019-01-12 LAB — CBC
HCT: 30.8 % — ABNORMAL LOW (ref 39.0–52.0)
Hemoglobin: 10.5 g/dL — ABNORMAL LOW (ref 13.0–17.0)
MCH: 36.7 pg — ABNORMAL HIGH (ref 26.0–34.0)
MCHC: 34.1 g/dL (ref 30.0–36.0)
MCV: 107.7 fL — ABNORMAL HIGH (ref 80.0–100.0)
PLATELETS: 60 10*3/uL — AB (ref 150–400)
RBC: 2.86 MIL/uL — ABNORMAL LOW (ref 4.22–5.81)
RDW: 14.7 % (ref 11.5–15.5)
WBC: 4.9 10*3/uL (ref 4.0–10.5)
nRBC: 0 % (ref 0.0–0.2)

## 2019-01-12 LAB — MAGNESIUM: Magnesium: 1.7 mg/dL (ref 1.7–2.4)

## 2019-01-12 LAB — PHOSPHORUS: Phosphorus: 3.7 mg/dL (ref 2.5–4.6)

## 2019-01-12 MED ORDER — NICOTINE 21 MG/24HR TD PT24: 21.0000 mg | MEDICATED_PATCH | Freq: Every day | TRANSDERMAL | 0 refills | Status: DC

## 2019-01-12 MED ORDER — MAGNESIUM OXIDE 400 (241.3 MG) MG PO TABS
800.0000 mg | ORAL_TABLET | Freq: Once | ORAL | Status: AC
  Administered 2019-01-12: 800 mg via ORAL
  Filled 2019-01-12: qty 2

## 2019-01-12 MED ORDER — POTASSIUM CHLORIDE CRYS ER 20 MEQ PO TBCR
40.0000 meq | EXTENDED_RELEASE_TABLET | Freq: Once | ORAL | Status: AC
  Administered 2019-01-12: 40 meq via ORAL
  Filled 2019-01-12: qty 4

## 2019-01-12 MED ORDER — CEFUROXIME AXETIL 500 MG PO TABS: 500.0000 mg | ORAL_TABLET | Freq: Two times a day (BID) | ORAL | 0 refills | Status: AC

## 2019-01-12 NOTE — Discharge Summary (Signed)
[facility] [place] NAME: Ryan Travis    MR#:  [id number]  DATE OF BIRTH:  26-Mar-1941  DATE OF ADMISSION:  01/08/2019   ADMITTING PHYSICIAN: Max Sane, MD  DATE OF DISCHARGE: 01/12/2019  PRIMARY CARE PHYSICIAN: [facility]   ADMISSION DIAGNOSIS:  Cough [O13] Alcoholic ketoacidosis [Y86.5] Thrombocytopenia ([place]) [D69.6] Acute cystitis with hematuria [N30.01] Demand ischemia of myocardium (HCC) [H84.6] Alcoholic intoxication without complication ([place]) [N62.952] DISCHARGE DIAGNOSIS:  Active Problems:   Alcoholic ketoacidosis  SECONDARY DIAGNOSIS:   Past Medical History:  Diagnosis Date  . Alcohol abuse   . Anxiety   . Arthritis   . 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 Cancer ([place])    multiple myeloma  . Depression   . Dysrhythmia   . Fall    3 weeks ago/ torn ligaments left knee/wearing brace/pt  . Hypertension   . Neuropathy   . Pain    chronic leg and ankle pain  . RLS (restless legs syndrome)    HOSPITAL COURSE:   Chief Complaint  Patient presents with  . Respiratory Distress   Chief complaint; Respiratory distress   History of presenting complaint; Ryan Travis  is a 78 y.o. male with a known history of multiple myeloma in remission, hypertension, CAD, STEMI s/p stent, depression, and polysubstance abuse who presented to the ED "feeling like death was at my doorstep, like I was going to die". Wife called 89 yesterday morning because he was having shortness of breath, received 1 DuoNeb treatment and refused to transfer to the hospital. Onset of symptoms after drinking a pint of vodka. He drinks 1 pint of vodka daily. Smokes about 1/2 pack of cigarettes a day. Endorses marijuana use without being asked. Patient actively intoxicated with elevated ethanol level in the ED. BS 58. + anion gap. Thrombocytopenic. Admitted to the hospitalist service for alcoholic ketoacidosis. Sepsis secondary to urinary tract  infection.  Please refer to the H&P dictated for further details   Hospital course; 1. Alcoholic ketoacidosis Resolved with IV fluid hydration. Hypokalemia , hypophosphatemia and hypomagnesemia; replaced prior to discharge  2.Dysphagia Was previously npo and seen by speech therapy for evaluation of difficulty swallowing. Diet recommendation given and orders placed- mech soft/regular with aspiration precautions and patient tolerating well   3.  Respiratory distress Resolved with nebulizer treatment previously.  Clinically stable at this time  4.Alcohol abusewith acute withdrawal Clinically stable at this time.  Symptoms completely resolved.  Was placed on CIWA protocol during this admission.  Previously transferred to ICU 01/09/2019 acute altered mental status. CT head 01/09/2019 negative for acute process.  Significantly improved clinically.  Has not required any.  Awake and alert and oriented.  Transferred out of ICU on 01/10/2019.  Patient back to baseline.  Requested for physical therapist to evaluate and treat with recommendations.  Patient declined physical therapy evaluation.  States he is back to his baseline.  Has all the equipments including wheelchair that he uses at home.  If he decides that he needs physical therapy he will follow-up with his primary care physician at the [facility] to assist with setting up home health with physical therapy at that time.    5. UTI, sepsis secondary to UTI Patient noted to be on IV Rocephin.  Urine culture growing E. coli.  Treated with IV Rocephin for 3 days and significantly improved.  Documented allergies to penicillins but patient tolerating cephalosporins well.  Discharged on p.o. cefuroxime  to complete treatment duration.   6. Elevated troponin Elevated to 0.04initially, down to 0.03. Likely demand ischemia from sepsis, UTI.He has a history of CAD, STEMI s/p stent. Not endorsing chest pain  7.Tobacco abuse Nicotine patch ordered.   Continue the same on discharge  8 neuropathy Continue gabapentin  9 hypertension Continue home meds   10. depression Zoloft  11.chronic substance abuse  LCSW Monica aware that family would like services at discharge for substance abuse.  Patient motivated to quit on discharge  12.  Thrombocytopenia; present on admission Platelet count improved to 60.  Likely due to chronic alcohol abuse.  No bleeding.   DISCHARGE CONDITIONS:  Clinically and hemodynamically stable.  Wishes to be discharged as soon as possible.  Back to baseline. CONSULTS OBTAINED:   DRUG ALLERGIES:   Allergies  Allergen Reactions  . Azithromycin Other (See Comments)    Other reaction(s): Unknown   . Morphine And Related   . Neomycin   . Other     METHIOLATE  . Penicillins     Did it involve swelling of the face/tongue/throat, SOB, or low BP? Yes Did it involve sudden or severe rash/hives, skin peeling, or any reaction on the inside of your mouth or nose? Yes Did you need to seek medical attention at a hospital or doctor's office? Yes When did it last happen? 10 years If all above answers are "NO", may proceed with cephalosporin use.   DISCHARGE MEDICATIONS:   Allergies as of 01/12/2019      Reactions   Azithromycin Other (See Comments)   Other reaction(s): Unknown   Morphine And Related    Neomycin    Other    METHIOLATE   Penicillins    Did it involve swelling of the face/tongue/throat, SOB, or low BP? Yes Did it involve sudden or severe rash/hives, skin peeling, or any reaction on the inside of your mouth or nose? Yes Did you need to seek medical attention at a hospital or doctor's office? Yes When did it last happen? 10 years If all above answers are "NO", may proceed with cephalosporin use.      Medication List    TAKE these medications   amLODipine 10 MG tablet Commonly known as:  NORVASC Take 10 mg by mouth daily.   atorvastatin 80 MG tablet Commonly known as:  LIPITOR Take 1  tablet (80 mg total) by mouth daily at 6 PM.   carvedilol 12.5 MG tablet Commonly known as:  COREG Take 12.5 mg by mouth 2 (two) times daily with a meal.   cefUROXime 500 MG tablet Commonly known as:  CEFTIN Take 1 tablet (500 mg total) by mouth 2 (two) times daily for 4 days.   clopidogrel 75 MG tablet Commonly known as:  PLAVIX Take 1 tablet (75 mg total) by mouth daily.   gabapentin 300 MG capsule Commonly known as:  NEURONTIN Take 300 mg by mouth 2 (two) times daily. 1 am 3 hs   HYDROcodone-acetaminophen 10-325 MG tablet Commonly known as:  NORCO Take 2 tablets by mouth daily.   nicotine 21 mg/24hr patch Commonly known as:  NICODERM CQ - dosed in mg/24 hours Place 1 patch (21 mg total) onto the skin daily. Start taking on:  January 13, 2019   sertraline 100 MG tablet Commonly known as:  ZOLOFT Take 200 mg by mouth daily.        DISCHARGE INSTRUCTIONS:   DIET:  Cardiac diet DISCHARGE CONDITION:  Stable ACTIVITY:  Activity as tolerated  OXYGEN:  Home Oxygen: No.  Oxygen Delivery: room air DISCHARGE LOCATION:  home   If you experience worsening of your admission symptoms, develop shortness of breath, life threatening emergency, suicidal or homicidal thoughts you must seek medical attention immediately by calling 911 or calling your MD immediately  if symptoms less severe.  You Must read complete instructions/literature along with all the possible adverse reactions/side effects for all the Medicines you take and that have been prescribed to you. Take any new Medicines after you have completely understood and accpet all the possible adverse reactions/side effects.   Please note  You were cared for by a hospitalist during your hospital stay. If you have any questions about your discharge medications or the care you received while you were in the hospital after you are discharged, you can call the unit and asked to speak with the hospitalist on call if the  hospitalist that took care of you is not available. Once you are discharged, your primary care physician will handle any further medical issues. Please note that NO REFILLS for any discharge medications will be authorized once you are discharged, as it is imperative that you return to your primary care physician (or establish a relationship with a primary care physician if you do not have one) for your aftercare needs so that they can reassess your need for medications and monitor your lab values.    On the day of Discharge:  VITAL SIGNS:  Blood pressure 133/86, pulse 96, temperature 98.5 F (36.9 C), temperature source Oral, resp. rate 19, height '5\' 8"'  (1.727 m), weight 84.4 kg, SpO2 94 %. PHYSICAL EXAMINATION:  GENERAL:  78 y.o.-year-old patient lying in the bed with no acute distress.  EYES: Pupils equal, round, reactive to light and accommodation. No scleral icterus. Extraocular muscles intact.  HEENT: Head atraumatic, normocephalic. Oropharynx and nasopharynx clear.  NECK:  Supple, no jugular venous distention. No thyroid enlargement, no tenderness.  LUNGS: Normal breath sounds bilaterally, no wheezing, rales,rhonchi or crepitation. No use of accessory muscles of respiration.  CARDIOVASCULAR: S1, S2 normal. No murmurs, rubs, or gallops.  ABDOMEN: Soft, non-tender, non-distended. Bowel sounds present. No organomegaly or mass.  EXTREMITIES: No pedal edema, cyanosis, or clubbing.  NEUROLOGIC: Cranial nerves II through XII are intact. Muscle strength 5/5 in all extremities. Sensation intact. Gait not checked.  PSYCHIATRIC: The patient is alert and oriented x 3.  SKIN: No obvious rash, lesion, or ulcer.  DATA REVIEW:   CBC Recent Labs  Lab 01/12/19 0521  WBC 4.9  HGB 10.5*  HCT 30.8*  PLT 60*    Chemistries  Recent Labs  Lab 01/08/19 0511  01/12/19 0521  NA 143   < > 134*  K 4.1   < > 3.4*  CL 103   < > 101  CO2 21*   < > 28  GLUCOSE 58*   < > 87  BUN 21   < > 22    CREATININE 0.80   < > 0.73  CALCIUM 8.1*   < > 7.9*  MG  --    < > 1.7  AST 74*  --   --   ALT 28  --   --   ALKPHOS 79  --   --   BILITOT 1.3*  --   --    < > = values in this interval not displayed.     Microbiology Results  Results for orders placed or performed during the hospital encounter of 01/08/19  Urine  Culture     Status: Abnormal   Collection Time: 01/08/19  5:11 AM  Result Value Ref Range Status   Specimen Description   Final    URINE, RANDOM Performed at [facility], [address] [place], [place] [address]    Special Requests   Final    NONE Performed at [facility], [address] [place], [place] [address]    Culture >=100,000 COLONIES/mL ESCHERICHIA COLI (A)  Final   Report Status 01/10/2019 FINAL  Final   Organism ID, Bacteria ESCHERICHIA COLI (A)  Final      Susceptibility   Escherichia coli - MIC*    CEFAZOLIN <=4 SENSITIVE Sensitive     CIPROFLOXACIN <=0.25 SENSITIVE Sensitive     GENTAMICIN <=1 SENSITIVE Sensitive     IMIPENEM <=0.25 SENSITIVE Sensitive     NITROFURANTOIN <=16 SENSITIVE Sensitive     TRIMETH/SULFA <=20 SENSITIVE Sensitive     AMPICILLIN/SULBACTAM <=2 SENSITIVE Sensitive     Extended ESBL NEGATIVE Sensitive     * >=100,000 COLONIES/mL ESCHERICHIA COLI  MRSA PCR Screening     Status: None   Collection Time: 01/08/19 10:30 AM  Result Value Ref Range Status   MRSA by PCR NEGATIVE NEGATIVE Final    Comment:        The GeneXpert MRSA Assay (FDA approved for NASAL specimens only), is one component of a comprehensive MRSA colonization surveillance program. It is not intended to diagnose MRSA infection nor to guide or monitor treatment for MRSA infections. Performed at [facility], [address] [place], [place] [address]   Culture, blood (routine x 2)     Status: None (Preliminary result)   Collection Time: 01/08/19 12:24 PM  Result Value Ref Range Status   Specimen Description BLOOD   Final   Special Requests BLOOD  Final   Culture   Final    NO GROWTH 4 DAYS Performed at [facility], [address], [place], [place] [address]    Report Status PENDING  Incomplete  Culture, blood (routine x 2)     Status: None (Preliminary result)   Collection Time: 01/08/19 12:55 PM  Result Value Ref Range Status   Specimen Description BLOOD  Final   Special Requests BLOOD  Final   Culture   Final    NO GROWTH 4 DAYS Performed at [facility], [address], [place], [place] [address]    Report Status PENDING  Incomplete    RADIOLOGY:  No results found.   Management plans discussed with the patient, family and they are in agreement.  CODE STATUS: Full Code   TOTAL TIME TAKING CARE OF THIS PATIENT: 39 minutes.    Renette Hsu M.D on 01/12/2019 at 1:06 PM  Between 7am to 6pm - Pager - [phone]  After 6pm go to [email or web address] - Proofreader  [facility]  Office  [phone]  CC: Primary care physician; [place]   Note: This dictation was prepared with Dragon dictation along with smaller phrase technology. Any transcriptional errors that result from this process are unintentional.

## 2019-01-12 NOTE — Progress Notes (Signed)
PT Cancellation Note  Patient Details Name: Ryan Travis MRN: [id number] DOB: Sep 07, 1941   Cancelled Treatment:    Reason Eval/Treat Not Completed: Prior to entering room nsg reported that pt is refusing PT services.  Entered room and pt confirmed no need/desire for PT services at this time.  Will attempt to see pt at a future date/time pending a change in status upon receipt of new PT orders.     Linus Salmons PT, DPT 01/12/19, 1:45 PM

## 2019-01-12 NOTE — Progress Notes (Addendum)
Pharmacy Electrolyte Monitoring Consult:  Pharmacy consulted to assist in monitoring and replacing electrolytes in this 79 y.o. male admitted on 01/08/2019 with Respiratory Distress   Labs:  Sodium (mmol/L)  Date Value  01/12/2019 134 (L)   Potassium (mmol/L)  Date Value  01/12/2019 3.4 (L)   Magnesium (mg/dL)  Date Value  01/12/2019 1.7   Phosphorus (mg/dL)  Date Value  01/12/2019 3.7   Calcium (mg/dL)  Date Value  01/12/2019 7.9 (L)   Albumin (g/dL)  Date Value  01/08/2019 3.9    Assessment/Plan: Patient currently on CIWA.    Patient received Magnesium Oxide 800mg  po x1 this am  Patient received KCL 69meq PO x 1 dose.   Will obtain BMP/Mag with am labs.   While on CIWA will replace for goal magnesium ~ 2 and to maintain other electrolytes within normal limits.   Pharmacy will continue to monitor and adjust per consult.   Lu Duffel, PharmD, BCPS Clinical Pharmacist 01/12/2019 7:20 AM

## 2019-01-12 NOTE — Progress Notes (Signed)
Pt is being discharged home.  AVS given and explained to pt. Pt verbalized understanding.  Rx given. Pt to scheduled f/u appointment.

## 2019-01-12 NOTE — Care Management Important Message (Signed)
Important Message  Patient Details  Name: Ryan Travis MRN: [id number] Date of Birth: 15-Feb-1941   Medicare Important Message Given:  Yes    Juliann Pulse A Swain [place] 01/12/2019, 11:26 AM

## 2019-01-12 NOTE — Progress Notes (Signed)
PT Cancellation Note  Patient Details Name: Ryan Travis MRN: [id number] DOB: 11-14-1940   Cancelled Treatment:    Reason Eval/Treat Not Completed: Patient eating breakfast, requested PT later this date.  Will attempt to see pt at a future date/time as appropriate.     Linus Salmons PT, DPT 01/12/19, 10:08 AM

## 2019-01-13 LAB — CULTURE, BLOOD (ROUTINE X 2)
Culture: NO GROWTH
Culture: NO GROWTH

## 2019-03-29 ENCOUNTER — Other Ambulatory Visit: Payer: Self-pay

## 2019-03-29 ENCOUNTER — Inpatient Hospital Stay: Payer: No Typology Code available for payment source

## 2019-03-29 ENCOUNTER — Inpatient Hospital Stay
Admission: EM | Admit: 2019-03-29 | Discharge: 2019-04-02 | DRG: 871 | Disposition: A | Discharge status: Home Health | Payer: No Typology Code available for payment source | Attending: Family Medicine | Admitting: Family Medicine

## 2019-03-29 ENCOUNTER — Emergency Department: Payer: No Typology Code available for payment source

## 2019-03-29 DIAGNOSIS — Z96643 Presence of artificial hip joint, bilateral: Secondary | ICD-10-CM | POA: Diagnosis present

## 2019-03-29 DIAGNOSIS — S91201A Unspecified open wound of right great toe with damage to nail, initial encounter: Secondary | ICD-10-CM | POA: Diagnosis not present

## 2019-03-29 DIAGNOSIS — Z885 Allergy status to narcotic agent status: Secondary | ICD-10-CM

## 2019-03-29 DIAGNOSIS — Z888 Allergy status to other drugs, medicaments and biological substances status: Secondary | ICD-10-CM

## 2019-03-29 DIAGNOSIS — E872 Acidosis, unspecified: Secondary | ICD-10-CM

## 2019-03-29 DIAGNOSIS — G92 Toxic encephalopathy: Secondary | ICD-10-CM | POA: Diagnosis present

## 2019-03-29 DIAGNOSIS — T426X5A Adverse effect of other antiepileptic and sedative-hypnotic drugs, initial encounter: Secondary | ICD-10-CM | POA: Diagnosis present

## 2019-03-29 DIAGNOSIS — G629 Polyneuropathy, unspecified: Secondary | ICD-10-CM | POA: Diagnosis present

## 2019-03-29 DIAGNOSIS — R4182 Altered mental status, unspecified: Secondary | ICD-10-CM | POA: Diagnosis present

## 2019-03-29 DIAGNOSIS — I10 Essential (primary) hypertension: Secondary | ICD-10-CM | POA: Diagnosis present

## 2019-03-29 DIAGNOSIS — L89899 Pressure ulcer of other site, unspecified stage: Secondary | ICD-10-CM | POA: Diagnosis present

## 2019-03-29 DIAGNOSIS — R778 Other specified abnormalities of plasma proteins: Secondary | ICD-10-CM

## 2019-03-29 DIAGNOSIS — B963 Hemophilus influenzae [H. influenzae] as the cause of diseases classified elsewhere: Secondary | ICD-10-CM | POA: Diagnosis not present

## 2019-03-29 DIAGNOSIS — X58XXXA Exposure to other specified factors, initial encounter: Secondary | ICD-10-CM | POA: Diagnosis not present

## 2019-03-29 DIAGNOSIS — D696 Thrombocytopenia, unspecified: Secondary | ICD-10-CM | POA: Diagnosis not present

## 2019-03-29 DIAGNOSIS — F17210 Nicotine dependence, cigarettes, uncomplicated: Secondary | ICD-10-CM | POA: Diagnosis present

## 2019-03-29 DIAGNOSIS — C9000 Multiple myeloma not having achieved remission: Secondary | ICD-10-CM | POA: Diagnosis present

## 2019-03-29 DIAGNOSIS — D6959 Other secondary thrombocytopenia: Secondary | ICD-10-CM | POA: Diagnosis present

## 2019-03-29 DIAGNOSIS — I248 Other forms of acute ischemic heart disease: Secondary | ICD-10-CM | POA: Diagnosis present

## 2019-03-29 DIAGNOSIS — L97519 Non-pressure chronic ulcer of other part of right foot with unspecified severity: Secondary | ICD-10-CM | POA: Diagnosis present

## 2019-03-29 DIAGNOSIS — F1020 Alcohol dependence, uncomplicated: Secondary | ICD-10-CM | POA: Diagnosis present

## 2019-03-29 DIAGNOSIS — E860 Dehydration: Secondary | ICD-10-CM | POA: Diagnosis present

## 2019-03-29 DIAGNOSIS — G8929 Other chronic pain: Secondary | ICD-10-CM | POA: Diagnosis present

## 2019-03-29 DIAGNOSIS — G2581 Restless legs syndrome: Secondary | ICD-10-CM | POA: Diagnosis present

## 2019-03-29 DIAGNOSIS — Y92009 Unspecified place in unspecified non-institutional (private) residence as the place of occurrence of the external cause: Secondary | ICD-10-CM

## 2019-03-29 DIAGNOSIS — S91309A Unspecified open wound, unspecified foot, initial encounter: Secondary | ICD-10-CM

## 2019-03-29 DIAGNOSIS — F329 Major depressive disorder, single episode, unspecified: Secondary | ICD-10-CM | POA: Diagnosis present

## 2019-03-29 DIAGNOSIS — Z20828 Contact with and (suspected) exposure to other viral communicable diseases: Secondary | ICD-10-CM | POA: Diagnosis present

## 2019-03-29 DIAGNOSIS — D509 Iron deficiency anemia, unspecified: Secondary | ICD-10-CM | POA: Diagnosis not present

## 2019-03-29 DIAGNOSIS — I252 Old myocardial infarction: Secondary | ICD-10-CM | POA: Diagnosis not present

## 2019-03-29 DIAGNOSIS — A413 Sepsis due to Hemophilus influenzae: Secondary | ICD-10-CM | POA: Diagnosis not present

## 2019-03-29 DIAGNOSIS — G9340 Encephalopathy, unspecified: Secondary | ICD-10-CM | POA: Diagnosis not present

## 2019-03-29 DIAGNOSIS — F1010 Alcohol abuse, uncomplicated: Secondary | ICD-10-CM | POA: Diagnosis not present

## 2019-03-29 DIAGNOSIS — E876 Hypokalemia: Secondary | ICD-10-CM | POA: Diagnosis not present

## 2019-03-29 DIAGNOSIS — A419 Sepsis, unspecified organism: Secondary | ICD-10-CM | POA: Diagnosis present

## 2019-03-29 DIAGNOSIS — I2510 Atherosclerotic heart disease of native coronary artery without angina pectoris: Secondary | ICD-10-CM | POA: Diagnosis present

## 2019-03-29 DIAGNOSIS — N179 Acute kidney failure, unspecified: Secondary | ICD-10-CM | POA: Diagnosis present

## 2019-03-29 DIAGNOSIS — Z9842 Cataract extraction status, left eye: Secondary | ICD-10-CM

## 2019-03-29 DIAGNOSIS — Z880 Allergy status to penicillin: Secondary | ICD-10-CM

## 2019-03-29 DIAGNOSIS — R740 Nonspecific elevation of levels of transaminase and lactic acid dehydrogenase [LDH]: Secondary | ICD-10-CM | POA: Diagnosis not present

## 2019-03-29 DIAGNOSIS — Z955 Presence of coronary angioplasty implant and graft: Secondary | ICD-10-CM

## 2019-03-29 DIAGNOSIS — Z9841 Cataract extraction status, right eye: Secondary | ICD-10-CM

## 2019-03-29 DIAGNOSIS — M2041 Other hammer toe(s) (acquired), right foot: Secondary | ICD-10-CM | POA: Diagnosis present

## 2019-03-29 DIAGNOSIS — Z8249 Family history of ischemic heart disease and other diseases of the circulatory system: Secondary | ICD-10-CM

## 2019-03-29 DIAGNOSIS — Z961 Presence of intraocular lens: Secondary | ICD-10-CM | POA: Diagnosis present

## 2019-03-29 DIAGNOSIS — M2042 Other hammer toe(s) (acquired), left foot: Secondary | ICD-10-CM | POA: Diagnosis not present

## 2019-03-29 DIAGNOSIS — R7881 Bacteremia: Secondary | ICD-10-CM | POA: Diagnosis not present

## 2019-03-29 LAB — CBC WITH DIFFERENTIAL/PLATELET
Abs Immature Granulocytes: 0.05 10*3/uL (ref 0.00–0.07)
Basophils Absolute: 0 10*3/uL (ref 0.0–0.1)
Basophils Relative: 0 %
Eosinophils Absolute: 0 10*3/uL (ref 0.0–0.5)
Eosinophils Relative: 0 %
HCT: 39.6 % (ref 39.0–52.0)
Hemoglobin: 13.5 g/dL (ref 13.0–17.0)
Immature Granulocytes: 1 %
Lymphocytes Relative: 18 %
Lymphs Abs: 0.9 10*3/uL (ref 0.7–4.0)
MCH: 35.2 pg — ABNORMAL HIGH (ref 26.0–34.0)
MCHC: 34.1 g/dL (ref 30.0–36.0)
MCV: 103.1 fL — ABNORMAL HIGH (ref 80.0–100.0)
Monocytes Absolute: 0.6 10*3/uL (ref 0.1–1.0)
Monocytes Relative: 11 %
Neutro Abs: 3.5 10*3/uL (ref 1.7–7.7)
Neutrophils Relative %: 70 %
Platelets: 33 10*3/uL — ABNORMAL LOW (ref 150–400)
RBC: 3.84 MIL/uL — ABNORMAL LOW (ref 4.22–5.81)
RDW: 13.2 % (ref 11.5–15.5)
WBC: 5.1 10*3/uL (ref 4.0–10.5)
nRBC: 0 % (ref 0.0–0.2)

## 2019-03-29 LAB — BASIC METABOLIC PANEL
Anion gap: 22 — ABNORMAL HIGH (ref 5–15)
BUN: 57 mg/dL — ABNORMAL HIGH (ref 8–23)
CO2: 18 mmol/L — ABNORMAL LOW (ref 22–32)
Calcium: 8.3 mg/dL — ABNORMAL LOW (ref 8.9–10.3)
Chloride: 96 mmol/L — ABNORMAL LOW (ref 98–111)
Creatinine, Ser: 1.36 mg/dL — ABNORMAL HIGH (ref 0.61–1.24)
GFR calc Af Amer: 58 mL/min — ABNORMAL LOW (ref >60)
GFR calc non Af Amer: 50 mL/min — ABNORMAL LOW (ref >60)
Glucose, Bld: 126 mg/dL — ABNORMAL HIGH (ref 70–99)
Potassium: 3.1 mmol/L — ABNORMAL LOW (ref 3.5–5.1)
Sodium: 136 mmol/L (ref 135–145)

## 2019-03-29 LAB — LACTIC ACID, PLASMA: Lactic Acid, Venous: 3.5 mmol/L (ref 0.5–1.9)

## 2019-03-29 LAB — BRAIN NATRIURETIC PEPTIDE: B Natriuretic Peptide: 249 pg/mL — ABNORMAL HIGH (ref 0.0–100.0)

## 2019-03-29 LAB — TROPONIN I: Troponin I: 0.17 ng/mL (ref <0.03)

## 2019-03-29 LAB — MAGNESIUM: Magnesium: 1.7 mg/dL (ref 1.7–2.4)

## 2019-03-29 LAB — SARS CORONAVIRUS 2 BY RT PCR (HOSPITAL ORDER, PERFORMED IN ~~LOC~~ HOSPITAL LAB): SARS Coronavirus 2: NEGATIVE

## 2019-03-29 LAB — SEDIMENTATION RATE: Sed Rate: 5 mm/hr (ref 0–20)

## 2019-03-29 MED ORDER — VANCOMYCIN HCL 1.25 G IV SOLR
1250.0000 mg | INTRAVENOUS | Status: DC
  Administered 2019-03-30 – 2019-03-31 (×2): 1250 mg via INTRAVENOUS
  Filled 2019-03-29 (×2): qty 1250

## 2019-03-29 MED ORDER — BUDESONIDE 0.5 MG/2ML IN SUSP
0.5000 mg | Freq: Two times a day (BID) | RESPIRATORY_TRACT | Status: DC
  Administered 2019-03-29 – 2019-03-31 (×4): 0.5 mg via RESPIRATORY_TRACT
  Filled 2019-03-29 (×5): qty 2

## 2019-03-29 MED ORDER — ONDANSETRON HCL 4 MG PO TABS: 4.0000 mg | ORAL_TABLET | Freq: Four times a day (QID) | ORAL | Status: DC | PRN

## 2019-03-29 MED ORDER — VANCOMYCIN HCL IN DEXTROSE 1-5 GM/200ML-% IV SOLN
1000.0000 mg | Freq: Once | INTRAVENOUS | Status: AC
  Administered 2019-03-29: 16:00:00 1000 mg via INTRAVENOUS
  Filled 2019-03-29: qty 200

## 2019-03-29 MED ORDER — ACETAMINOPHEN 650 MG RE SUPP: 650.0000 mg | Freq: Four times a day (QID) | RECTAL | Status: DC | PRN

## 2019-03-29 MED ORDER — VANCOMYCIN HCL 10 G IV SOLR
1250.0000 mg | INTRAVENOUS | Status: DC
  Filled 2019-03-29: qty 1250

## 2019-03-29 MED ORDER — ONDANSETRON HCL 4 MG/2ML IJ SOLN: 4.0000 mg | Freq: Four times a day (QID) | INTRAMUSCULAR | Status: DC | PRN

## 2019-03-29 MED ORDER — SODIUM CHLORIDE 0.9 % IV SOLN
1.0000 g | Freq: Three times a day (TID) | INTRAVENOUS | Status: DC
  Administered 2019-03-29 – 2019-03-30 (×3): 1 g via INTRAVENOUS
  Filled 2019-03-29 (×6): qty 1

## 2019-03-29 MED ORDER — LEVOFLOXACIN IN D5W 750 MG/150ML IV SOLN
750.0000 mg | Freq: Once | INTRAVENOUS | Status: AC
  Administered 2019-03-29: 750 mg via INTRAVENOUS
  Filled 2019-03-29: qty 150

## 2019-03-29 MED ORDER — LEVOFLOXACIN IN D5W 750 MG/150ML IV SOLN: 750.0000 mg | INTRAVENOUS | Status: DC

## 2019-03-29 MED ORDER — POTASSIUM CHLORIDE IN NACL 20-0.9 MEQ/L-% IV SOLN
INTRAVENOUS | Status: DC
  Administered 2019-03-29 – 2019-04-02 (×5): via INTRAVENOUS
  Filled 2019-03-29 (×9): qty 1000

## 2019-03-29 MED ORDER — ACETAMINOPHEN 325 MG PO TABS: 650.0000 mg | ORAL_TABLET | Freq: Four times a day (QID) | ORAL | Status: DC | PRN

## 2019-03-29 MED ORDER — AZTREONAM 1 G IJ SOLR: 1.0000 g | Freq: Three times a day (TID) | INTRAMUSCULAR | Status: DC

## 2019-03-29 MED ORDER — IPRATROPIUM-ALBUTEROL 0.5-2.5 (3) MG/3ML IN SOLN
3.0000 mL | Freq: Four times a day (QID) | RESPIRATORY_TRACT | Status: DC
  Administered 2019-03-29: 3 mL via RESPIRATORY_TRACT
  Filled 2019-03-29: qty 3

## 2019-03-29 MED ORDER — AZTREONAM 1 G IJ SOLR
1.0000 g | Freq: Three times a day (TID) | INTRAMUSCULAR | Status: DC
  Administered 2019-03-29: 1 g via INTRAMUSCULAR
  Filled 2019-03-29 (×4): qty 1

## 2019-03-29 MED ORDER — SODIUM CHLORIDE 0.9 % IV SOLN
Freq: Once | INTRAVENOUS | Status: AC
  Administered 2019-03-29: 16:00:00 1000 mL via INTRAVENOUS

## 2019-03-29 NOTE — ED Triage Notes (Signed)
Pt comes from home with EMS after having a cough for about a week with increasing SOB. Pt has wound on his big toe on the right foot. Pt is alert but not very oriented at this time. Pt has lost control of his stool with EMS. Pt is 99% on RA but appears to be tachypnic.

## 2019-03-29 NOTE — ED Notes (Signed)
Pt was changed by this RN and Joellen Jersey, NT. Pt had red, open rashy area on bottom with some bleeding. Barrier cream applied and new brief applied.

## 2019-03-29 NOTE — ED Provider Notes (Signed)
[facility] Emergency Department Provider Note       Time seen: ----------------------------------------- 3:40 PM on 03/29/2019 -----------------------------------------   I have reviewed the triage vital signs and the nursing notes.  HISTORY   Chief Complaint Shortness of Breath    HPI Ryan Travis is a 78 y.o. male with a history of alcohol abuse, arthritis, aseptic bony necrosis, multiple myeloma, depression, hypertension, chronic pain who presents to the ED for cough with increased shortness of breath and altered mental status.  Patient was noted to have a wound on the great toe of his right foot.  Patient arrives disoriented but arouses to verbal stimuli.  Patient was incontinent in route.  Past Medical History:  Diagnosis Date  . Alcohol abuse   . Anxiety   . Arthritis   . Aseptic bony necrosis ([place])   . Cancer ([place])    multiple myeloma  . Depression   . Dysrhythmia   . Fall    3 weeks ago/ torn ligaments left knee/wearing brace/pt  . Hypertension   . Neuropathy   . Pain    chronic leg and ankle pain  . RLS (restless legs syndrome)     Patient Active Problem List   Diagnosis Date Noted  . Alcoholic ketoacidosis 25/95/6387  . Pressure injury of skin 10/18/2016  . STEMI involving right coronary artery ([place]) 10/16/2016  . STEMI (ST elevation myocardial infarction) ([place]) 10/16/2016  . Myocardial infarct ([place]) 10/16/2016    Past Surgical History:  Procedure Laterality Date  . BRAIN SURGERY     subdural hematoma  . CARDIAC CATHETERIZATION N/A 10/16/2016   Procedure: Left Heart Cath and Coronary Angiography;  Surgeon: Nelva Bush, MD;  Location: [place] [facility];  Service: Cardiovascular;  Laterality: N/A;  . CARDIAC CATHETERIZATION N/A 10/16/2016   Procedure: Coronary Stent Intervention;  Surgeon: Nelva Bush, MD;  Location: [place] [facility];  Service: Cardiovascular;  Laterality: N/A;  . CARDIAC CATHETERIZATION N/A  10/16/2016   Procedure: Temporary Pacemaker;  Surgeon: Nelva Bush, MD;  Location: [place] [facility];  Service: Cardiovascular;  Laterality: N/A;  . CATARACT EXTRACTION W/PHACO Right 08/16/2016   Procedure: CATARACT EXTRACTION PHACO AND INTRAOCULAR LENS PLACEMENT (IOC);  Surgeon: Eulogio Bear, MD;  Location: [facility] ORS;  Service: Ophthalmology;  Laterality: Right;  Lot # [id number] H [place]: 01:00.8 AP%:16.5 CDE: 10.02   . CATARACT EXTRACTION W/PHACO Left 10/04/2016   Procedure: CATARACT EXTRACTION PHACO AND INTRAOCULAR LENS PLACEMENT (IOC);  Surgeon: Eulogio Bear, MD;  Location: [facility] ORS;  Service: Ophthalmology;  Laterality: Left;  [place] 35.4 AP% 10.5 CDE 4.11 Fluid pack lot # [id number]  . EYE SURGERY    . FRACTURE SURGERY     multiple right ankle  . HERNIA REPAIR    . JOINT REPLACEMENT Bilateral    thr  . KNEE ARTHROSCOPY W/ ACL RECONSTRUCTION     left x 2  . rcr      Allergies Azithromycin; Morphine and related; Neomycin; Other; and Penicillins  Social History Social History   Tobacco Use  . Smoking status: Former Smoker    Packs/day: 0.25    Types: Cigarettes  . Smokeless tobacco: Never Used  Substance Use Topics  . Alcohol use: No  . Drug use: No   Review of Systems Constitutional: Positive for fever Respiratory: Shortness of breath and cough Skin: Positive for wound to his right great toe Neurological: Positive for weakness and altered mental status  All systems otherwise unknown, patient with altered mental  status  ____________________________________________   PHYSICAL EXAM:  VITAL SIGNS: ED Triage Vitals  Enc Vitals Group     BP --      Pulse Rate 03/29/19 1535 (!) 52     Resp 03/29/19 1535 19     Temp --      Temp src --      SpO2 03/29/19 1535 99 %     Weight 03/29/19 1537 170 lb (77.1 kg)     Height 03/29/19 1537 _0  (1.778 m)     Head Circumference --      Peak Flow --      Pain Score --      Pain Loc --      Pain Edu? --       Excl. in [place]? --     Constitutional: Drowsy but responds to verbal stimuli.  Mild to moderate distress ENT      Head: Normocephalic and atraumatic.      Nose: No congestion/rhinnorhea.      Mouth/Throat: Mucous membranes are moist.      Neck: No stridor. Cardiovascular: Rapid rate, regular rhythm. No murmurs, rubs, or gallops. Respiratory: Tachypnea with clear breath sounds Gastrointestinal: Soft and nontender. Normal bowel sounds Musculoskeletal: Chronic deformities to both lower extremities.  There is a deformity to the left knee and to the right distal tibia and ankle.  Diminished pulses in the right foot compared to left.  There is a wound with a necrotic smell involving the right great toe and the first webspace. Neurologic:  Normal speech and language. No gross focal neurologic deficits are appreciated.  Skin: Wounds as dictated above Psychiatric: Lethargic, responds to verbal stimuli.  Mood and affect are unremarkable when aroused ____________________________________________  EKG: Interpreted by me.  Sinus tachycardia with a rate of 120 bpm, PVCs, left anterior fascicular block, low voltage, long QT  ____________________________________________  ED COURSE:  As part of my medical decision making, I reviewed the following data within the [place] History obtained from family if available, nursing notes, old chart and ekg, as well as notes from prior ED visits. Patient presented for multiple issues including cough, shortness of breath, right foot wound and altered mental status, we will assess with labs and imaging as indicated at this time.   Procedures  Ryan Travis was evaluated in Emergency Department on 03/29/2019 for the symptoms described in the history of present illness. He was evaluated in the context of the global COVID-19 pandemic, which necessitated consideration that the patient might be at risk for infection with the SARS-CoV-2 virus that causes  COVID-19. Institutional protocols and algorithms that pertain to the evaluation of patients at risk for COVID-19 are in a state of rapid change based on information released by regulatory bodies including the CDC and federal and state organizations. These policies and algorithms were followed during the patient's care in the ED.  ____________________________________________   LABS (pertinent positives/negatives)  Labs Reviewed  CBC WITH DIFFERENTIAL/PLATELET - Abnormal; Notable for the following components:      Result Value   RBC 3.84 (*)    MCV 103.1 (*)    MCH 35.2 (*)    Platelets 33 (*)    All other components within normal limits  BASIC METABOLIC PANEL - Abnormal; Notable for the following components:   Potassium 3.1 (*)    Chloride 96 (*)    CO2 18 (*)    Glucose, Bld 126 (*)    BUN 57 (*)  Creatinine, Ser 1.36 (*)    Calcium 8.3 (*)    GFR calc non Af Amer 50 (*)    GFR calc Af Amer 58 (*)    Anion gap 22 (*)    All other components within normal limits  BRAIN NATRIURETIC PEPTIDE - Abnormal; Notable for the following components:   B Natriuretic Peptide 249.0 (*)    All other components within normal limits  TROPONIN I - Abnormal; Notable for the following components:   Troponin I 0.17 (*)    All other components within normal limits  LACTIC ACID, PLASMA - Abnormal; Notable for the following components:   Lactic Acid, Venous 3.5 (*)    All other components within normal limits  SARS CORONAVIRUS 2 (HOSPITAL ORDER, [place] [facility])  CULTURE, BLOOD (ROUTINE X 2)  CULTURE, BLOOD (ROUTINE X 2)  SEDIMENTATION RATE   CRITICAL CARE Performed by: Laurence Aly   Total critical care time: 30 minutes  Critical care time was exclusive of separately billable procedures and treating other patients.  Critical care was necessary to treat or prevent imminent or life-threatening deterioration.  Critical care was time spent personally by me on the  following activities: development of treatment plan with patient and/or surrogate as well as nursing, discussions with consultants, evaluation of patient's response to treatment, examination of patient, obtaining history from patient or surrogate, ordering and performing treatments and interventions, ordering and review of laboratory studies, ordering and review of radiographic studies, pulse oximetry and re-evaluation of patient's condition.  RADIOLOGY Images were viewed by me  Chest x-ray, right foot x-rays IMPRESSION: No edema or consolidation. Heart size normal. Aortic Atherosclerosis (ICD10-I70.0).  Bones osteoporotic. Superior migration of each humeral head is indicative of chronic rotator cuff tears. IMPRESSION: 1. No radiographic evidence of osteomyelitis of great toe. 2. Osteoarthritis of the right foot as described above. 3. Prior tibiotalar and subtalar arthrodesis. Lucency around the distal intramedullary nail and interlocking screw consistent with loosening versus infection.  ____________________________________________   DIFFERENTIAL DIAGNOSIS   Sepsis, osteomyelitis, coronavirus, dehydration, electrolyte abnormality  FINAL ASSESSMENT AND PLAN  Altered mental status, dehydration, lactic acidosis, elevated troponin   Plan: The patient had presented for cough, shortness of breath, right foot wound and altered mental status. Patient's labs revealed multiple abnormalities including dehydration, elevated anion gap acidosis and an elevated troponin likely demand related. Patient's imaging was concerning for lucency around the distal intramedullary nail and interlocking screw.  This may warrant further work-up.  He does have foul-smelling wound on his right foot.  We have ordered broad-spectrum antibiotics and IV fluids.  He could also be an alcoholic ketoacidosis.  I will discuss with the hospitalist for admission.   Laurence Aly, MD    Note: This note was  generated in part or whole with voice recognition software. Voice recognition is usually quite accurate but there are transcription errors that can and very often do occur. I apologize for any typographical errors that were not detected and corrected.     Earleen Newport, MD 03/29/19 [phone]

## 2019-03-29 NOTE — Progress Notes (Signed)
Pharmacy Antibiotic Note  Ryan Travis is a 78 y.o. male admitted on 03/29/2019 with sepsis and wound infection.  Pharmacy has been consulted for Vancomycin, Levaquin, and Aztreonam dosing.  Plan: Vancomycin 1250 mg IV Q 24 hrs. Goal AUC 400-550. Expected AUC: 511.7 SCr used: 1.36 Expected Cmin: 12.8  Levaquin 750mg  IV q48h  Aztreonam 1g IV q8h   Height: 5\' 10"  (177.8 cm) Weight: 170 lb (77.1 kg) IBW/kg (Calculated) : 73  Temp (24hrs), Avg:98.1 F (36.7 C), Min:98.1 F (36.7 C), Max:98.1 F (36.7 C)  Recent Labs  Lab 03/29/19 1541 03/29/19 1552  WBC 5.1  --   CREATININE 1.36*  --   LATICACIDVEN  --  3.5*    Estimated Creatinine Clearance: 47.7 mL/min (A) (by C-G formula based on SCr of 1.36 mg/dL (H)).    Allergies  Allergen Reactions  . Azithromycin Other (See Comments)    Other reaction(s): Unknown   . Morphine And Related   . Neomycin   . Other     METHIOLATE  . Penicillins     Did it involve swelling of the face/tongue/throat, SOB, or low BP? Yes Did it involve sudden or severe rash/hives, skin peeling, or any reaction on the inside of your mouth or nose? Yes Did you need to seek medical attention at a hospital or doctor's office? Yes When did it last happen? 10 years If all above answers are "NO", may proceed with cephalosporin use.    Antimicrobials this admission: Vancomycin 5/24 >>  Levaquin 5/24 >>  Aztreonam 5/24 >>  Thank you for allowing pharmacy to be a part of this patient's care.  Paulina Fusi, PharmD, BCPS 03/29/2019 6:36 PM

## 2019-03-29 NOTE — ED Notes (Signed)
Pt wife updated via phone

## 2019-03-29 NOTE — ED Notes (Signed)
Wife updated via phone about room number.

## 2019-03-29 NOTE — H&P (Signed)
[place] [facility] [place] NAME: Ryan Travis    MR#:  [id number]  DATE OF BIRTH:  1941/08/19  DATE OF ADMISSION:  03/29/2019  PRIMARY CARE PHYSICIAN: [facility]   REQUESTING/REFERRING PHYSICIAN: Dr. Lenise Arena  CHIEF COMPLAINT:   Chief Complaint  Patient presents with   Shortness of Breath    HISTORY OF PRESENT ILLNESS:  Ryan Travis  is a 78 y.o. male with a known history of alcohol abuse, anxiety, history of multiple myeloma, depression, neuropathy, essential hypertension, restless leg syndrome who presents from home due to cough, shortness of breath.  Patient is somewhat lethargic and a very poor historian.  Patient says he presents to the hospital because of just not feeling well but as per the wife patient was complaining of some cough and shortness of breath.  Patient's COVID-19 test here in the ER is negative.  In the ER patient met sepsis criteria given his elevated lactic acid, tachycardia and a right great toe which looks red and has a foul-smelling discharge.  Patient's x-ray of the right foot did not show any evidence of osteomyelitis.  She denies any chest pains, nausea, vomiting, fever, chills, abdominal pain.  He does state that he has been having some loose stools but no diarrhea was noted here in the ER.  Hospitalist services were contacted for admission given patient meeting sepsis criteria and noted to be in mild acute kidney injury with an elevated troponin.  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[place])    Cancer ([place])    multiple myeloma   Depression    Dysrhythmia    Fall    3 weeks ago/ torn ligaments left knee/wearing brace/pt   Hypertension    Neuropathy    Pain    chronic leg and ankle pain   RLS (restless legs syndrome)     PAST SURGICAL HISTORY:   Past Surgical History:  Procedure  Laterality Date   BRAIN SURGERY     subdural hematoma   CARDIAC CATHETERIZATION N/A 10/16/2016   Procedure: Left Heart Cath and Coronary Angiography;  Surgeon: Nelva Bush, MD;  Location: [place] [facility];  Service: Cardiovascular;  Laterality: N/A;   CARDIAC CATHETERIZATION N/A 10/16/2016   Procedure: Coronary Stent Intervention;  Surgeon: Nelva Bush, MD;  Location: [place] [facility];  Service: Cardiovascular;  Laterality: N/A;   CARDIAC CATHETERIZATION N/A 10/16/2016   Procedure: Temporary Pacemaker;  Surgeon: Nelva Bush, MD;  Location: [place] [facility];  Service: Cardiovascular;  Laterality: N/A;   CATARACT EXTRACTION W/PHACO Right 08/16/2016   Procedure: CATARACT EXTRACTION PHACO AND INTRAOCULAR LENS PLACEMENT (IOC);  Surgeon: Eulogio Bear, MD;  Location: [facility] ORS;  Service: Ophthalmology;  Laterality: Right;  Lot # [id number] H [place]: 01:00.8 AP%:16.5 CDE: 10.02    CATARACT EXTRACTION W/PHACO Left 10/04/2016   Procedure: CATARACT EXTRACTION PHACO AND INTRAOCULAR LENS PLACEMENT (IOC);  Surgeon: Eulogio Bear, MD;  Location: [facility] ORS;  Service: Ophthalmology;  Laterality: Left;  [place] 35.4 AP% 10.5 CDE 4.11 Fluid pack lot # [id number]   EYE SURGERY     FRACTURE SURGERY     multiple right ankle   HERNIA REPAIR     JOINT REPLACEMENT Bilateral    thr   KNEE ARTHROSCOPY W/ ACL RECONSTRUCTION     left x 2   rcr  SOCIAL HISTORY:   Social History   Tobacco Use   Smoking status: Former Smoker    Packs/day: 0.30    Years: 20.00    Pack years: 6.00    Types: Cigarettes   Smokeless tobacco: Never Used  Substance Use Topics   Alcohol use: Yes    Frequency: Never    Comment: 2-3 beers/day or wine    FAMILY HISTORY:   Family History  Problem Relation Age of Onset   Heart attack Mother    Heart attack Father     DRUG ALLERGIES:   Allergies  Allergen Reactions   Azithromycin Other (See Comments)    Other reaction(s):  Unknown    Morphine And Related    Neomycin    Other     METHIOLATE   Penicillins     Did it involve swelling of the face/tongue/throat, SOB, or low BP? Yes Did it involve sudden or severe rash/hives, skin peeling, or any reaction on the inside of your mouth or nose? Yes Did you need to seek medical attention at a hospital or doctor's office? Yes When did it last happen? 10 years If all above answers are NO, may proceed with cephalosporin use.    REVIEW OF SYSTEMS:   Review of Systems  Unable to perform ROS: Mental acuity    MEDICATIONS AT HOME:   Prior to Admission medications   Medication Sig Start Date End Date Taking? Authorizing Provider  amLODipine (NORVASC) 10 MG tablet Take 10 mg by mouth daily.     [provider]  atorvastatin (LIPITOR) 80 MG tablet Take 1 tablet (80 mg total) by mouth daily at 6 PM. 10/19/16   Fritzi Mandes, MD  carvedilol (COREG) 12.5 MG tablet Take 12.5 mg by mouth 2 (two) times daily with a meal.    [provider]  clopidogrel (PLAVIX) 75 MG tablet Take 1 tablet (75 mg total) by mouth daily. 10/20/16   Fritzi Mandes, MD  gabapentin (NEURONTIN) 300 MG capsule Take 300 mg by mouth 2 (two) times daily. 1 am 3 hs    [provider]  HYDROcodone-acetaminophen (NORCO) 10-325 MG tablet Take 2 tablets by mouth daily.     [provider]  nicotine (NICODERM CQ - DOSED IN MG/24 HOURS) 21 mg/24hr patch Place 1 patch (21 mg total) onto the skin daily. 01/13/19   Stark Jock Jude, MD  sertraline (ZOLOFT) 100 MG tablet Take 200 mg by mouth daily.    [provider]      VITAL SIGNS:  Blood pressure (!) 131/94, pulse (!) 107, temperature 98.1 F (36.7 C), temperature source Axillary, resp. rate 20, height _0  (1.778 m), weight 77.1 kg, SpO2 100 %.  PHYSICAL EXAMINATION:  Physical Exam  GENERAL:  78 y.o.-year-old unkempt patient lying in the bed in no acute distress.  EYES: Pupils equal, round, reactive to light  and accommodation. No scleral icterus. Extraocular muscles intact.  HEENT: Head atraumatic, normocephalic. Oropharynx and nasopharynx clear. No oropharyngeal erythema, moist oral mucosa  NECK:  Supple, no jugular venous distention. No thyroid enlargement, no tenderness.  LUNGS: Normal breath sounds bilaterally, no wheezing, rales, rhonchi. No use of accessory muscles of respiration.  CARDIOVASCULAR: S1, S2 RRR. No murmurs, rubs, gallops, clicks.  ABDOMEN: Soft, nontender, nondistended. Bowel sounds present. No organomegaly or mass.  EXTREMITIES: No pedal edema, cyanosis, or clubbing. + 2 pedal & radial pulses b/l.   NEUROLOGIC: Cranial nerves II through XII are intact. No focal Motor or sensory deficits appreciated  b/l PSYCHIATRIC: The patient is alert and oriented x 3.  SKIN: No obvious rash, lesion, or ulcer. Right Great toe ulcer as seen below.      LABORATORY PANEL:   CBC Recent Labs  Lab 03/29/19 1541  WBC 5.1  HGB 13.5  HCT 39.6  PLT 33*   ------------------------------------------------------------------------------------------------------------------  Chemistries  Recent Labs  Lab 03/29/19 1541  NA 136  K 3.1*  CL 96*  CO2 18*  GLUCOSE 126*  BUN 57*  CREATININE 1.36*  CALCIUM 8.3*   ------------------------------------------------------------------------------------------------------------------  Cardiac Enzymes Recent Labs  Lab 03/29/19 1541  TROPONINI 0.17*   ------------------------------------------------------------------------------------------------------------------  RADIOLOGY:  Dg Chest Port 1 View  Result Date: 03/29/2019 CLINICAL DATA:  Shortness of breath and cough EXAM: PORTABLE CHEST 1 VIEW COMPARISON:  January 08, 2019 FINDINGS: There is no edema or consolidation. Heart size and pulmonary vascularity are normal. No adenopathy. There is aortic atherosclerosis. Bones are osteoporotic. There is degenerative change in each shoulder with superior  migration of the right and left humeral heads. There are surgical clips in the left axillary region. IMPRESSION: No edema or consolidation. Heart size normal. Aortic Atherosclerosis (ICD10-I70.0). Bones osteoporotic. Superior migration of each humeral head is indicative of chronic rotator cuff tears. Electronically Signed   By: Lowella Grip III M.D.   On: 03/29/2019 15:59   Dg Foot 2 Views Right  Result Date: 03/29/2019 CLINICAL DATA:  Open wound of the foot. Wound is located along the great toe. EXAM: RIGHT FOOT - 2 VIEW COMPARISON:  None. FINDINGS: Severe osteopenia. No acute fracture or dislocation. Prior tibiotalar and subtalar arthrodesis. Lucency around the distal intramedullary nail and interlocking screw consistent with loosening versus infection. Moderate osteoarthritis of first tarsometatarsal joint. Moderate osteoarthritis of fifth MTP joint. Mild osteoarthritis of the second, third and fourth TMT joints. Mild osteoarthritis of the third MTP joint. No periosteal reaction or bone destruction. IMPRESSION: 1. No radiographic evidence of osteomyelitis of great toe. 2. Osteoarthritis of the right foot as described above. 3. Prior tibiotalar and subtalar arthrodesis. Lucency around the distal intramedullary nail and interlocking screw consistent with loosening versus infection. Electronically Signed   By: Kathreen Devoid   On: 03/29/2019 16:01     IMPRESSION AND PLAN:   78 y.o. male with a known history of alcohol abuse, anxiety, history of multiple myeloma, depression, neuropathy, essential hypertension, restless leg syndrome who presents from home due to cough, shortness of breath.  1.  Sepsis- patient meets criteria due to patient's elevated lactic acid, tachycardia and right great toe infection with foul-smelling discharge.  The source was thought to be the patient's right great toe.  Urinalysis is still pending and chest x-ray does not show any evidence of acute infection. - We will  empirically treat the patient with vancomycin, aztreonam, Levaquin as patient is penicillin allergic. -Follow cultures.  2.  Right great toe ulcer with foul-smelling discharge- continue broad-spectrum IV antibiotics with vancomycin, aztreonam, Levaquin. -I will get a MRI of the right foot and if it shows evidence of osteomyelitis would consider getting podiatry consult.  3.  Acute kidney injury- secondary to alcohol abuse and dehydration. - We will gently hydrate the patient with IV fluids, follow BUN/creatinine urine output. -Renal dose meds, avoid nephrotoxins.  4.  Elevated troponin-likely in the setting of supply demand ischemia from acute kidney injury and underlying sepsis. -We will cycle cardiac markers.  Observe on telemetry.  Check echocardiogram.  5.  Thrombocytopenia-secondary to alcohol abuse. -Follow platelet count.  No  acute bleeding presently.  6.  History of coronary disease status post PCI and stent placement- patient has a mildly elevated troponin which is likely secondary to demand ischemia. - We will continue his Plavix, statin, carvedilol.  7.  Depression-continue Zoloft.  8.  Neuropathy-continue gabapentin.    All the records are reviewed and case discussed with ED provider. Management plans discussed with the patient, family and they are in agreement.  CODE STATUS: Full code  TOTAL TIME TAKING CARE OF THIS PATIENT: 45 minutes.    Henreitta Leber M.D on 03/29/2019 at 5:29 PM  Between 7am to 6pm - Pager - [phone]  After 6pm go to [email or web address] - password [email or web address] [facility]  Office  [phone]  CC: Primary care physician; [facility] [place]

## 2019-03-29 NOTE — Progress Notes (Signed)
Pt non-responsive.

## 2019-03-30 ENCOUNTER — Inpatient Hospital Stay (HOSPITAL_COMMUNITY)
Admit: 2019-03-30 | Discharge: 2019-03-30 | Disposition: A | Discharge status: Home / Self Care | Payer: No Typology Code available for payment source | Attending: Specialist | Admitting: Specialist

## 2019-03-30 ENCOUNTER — Inpatient Hospital Stay: Payer: No Typology Code available for payment source

## 2019-03-30 DIAGNOSIS — R0602 Shortness of breath: Secondary | ICD-10-CM

## 2019-03-30 LAB — BLOOD CULTURE ID PANEL (REFLEXED)
Acinetobacter baumannii: NOT DETECTED
Candida albicans: NOT DETECTED
Candida glabrata: NOT DETECTED
Candida krusei: NOT DETECTED
Candida parapsilosis: NOT DETECTED
Candida tropicalis: NOT DETECTED
Enterobacter cloacae complex: NOT DETECTED
Enterobacteriaceae species: NOT DETECTED
Enterococcus species: NOT DETECTED
Escherichia coli: NOT DETECTED
Haemophilus influenzae: DETECTED — AB
Klebsiella oxytoca: NOT DETECTED
Klebsiella pneumoniae: NOT DETECTED
Listeria monocytogenes: NOT DETECTED
Neisseria meningitidis: NOT DETECTED
Proteus species: NOT DETECTED
Pseudomonas aeruginosa: NOT DETECTED
Serratia marcescens: NOT DETECTED
Staphylococcus aureus (BCID): NOT DETECTED
Staphylococcus species: NOT DETECTED
Streptococcus agalactiae: NOT DETECTED
Streptococcus pneumoniae: NOT DETECTED
Streptococcus pyogenes: NOT DETECTED
Streptococcus species: NOT DETECTED

## 2019-03-30 LAB — BASIC METABOLIC PANEL
Anion gap: 15 (ref 5–15)
BUN: 46 mg/dL — ABNORMAL HIGH (ref 8–23)
CO2: 22 mmol/L (ref 22–32)
Calcium: 7.7 mg/dL — ABNORMAL LOW (ref 8.9–10.3)
Chloride: 97 mmol/L — ABNORMAL LOW (ref 98–111)
Creatinine, Ser: 1.19 mg/dL (ref 0.61–1.24)
GFR calc Af Amer: >60 mL/min (ref >60)
GFR calc non Af Amer: 59 mL/min — ABNORMAL LOW (ref >60)
Glucose, Bld: 172 mg/dL — ABNORMAL HIGH (ref 70–99)
Potassium: 3.4 mmol/L — ABNORMAL LOW (ref 3.5–5.1)
Sodium: 134 mmol/L — ABNORMAL LOW (ref 135–145)

## 2019-03-30 LAB — URINALYSIS, COMPLETE (UACMP) WITH MICROSCOPIC
Bilirubin Urine: NEGATIVE
Glucose, UA: >=500 mg/dL — AB
Ketones, ur: 20 mg/dL — AB
Nitrite: NEGATIVE
Protein, ur: 30 mg/dL — AB
Specific Gravity, Urine: 1.014 (ref 1.005–1.030)
pH: 5 (ref 5.0–8.0)

## 2019-03-30 LAB — CBC
HCT: 33.3 % — ABNORMAL LOW (ref 39.0–52.0)
Hemoglobin: 11.7 g/dL — ABNORMAL LOW (ref 13.0–17.0)
MCH: 35.2 pg — ABNORMAL HIGH (ref 26.0–34.0)
MCHC: 35.1 g/dL (ref 30.0–36.0)
MCV: 100.3 fL — ABNORMAL HIGH (ref 80.0–100.0)
Platelets: 25 10*3/uL — CL (ref 150–400)
RBC: 3.32 MIL/uL — ABNORMAL LOW (ref 4.22–5.81)
RDW: 12.8 % (ref 11.5–15.5)
WBC: 3.5 10*3/uL — ABNORMAL LOW (ref 4.0–10.5)
nRBC: 0 % (ref 0.0–0.2)

## 2019-03-30 LAB — HEPATIC FUNCTION PANEL
ALT: 67 U/L — ABNORMAL HIGH (ref 0–44)
AST: 140 U/L — ABNORMAL HIGH (ref 15–41)
Albumin: 2.8 g/dL — ABNORMAL LOW (ref 3.5–5.0)
Alkaline Phosphatase: 186 U/L — ABNORMAL HIGH (ref 38–126)
Bilirubin, Direct: 0.6 mg/dL — ABNORMAL HIGH (ref 0.0–0.2)
Indirect Bilirubin: 1.1 mg/dL — ABNORMAL HIGH (ref 0.3–0.9)
Total Bilirubin: 1.7 mg/dL — ABNORMAL HIGH (ref 0.3–1.2)
Total Protein: 5.6 g/dL — ABNORMAL LOW (ref 6.5–8.1)

## 2019-03-30 LAB — ECHOCARDIOGRAM COMPLETE
Height: 70 in
Weight: 2720 oz

## 2019-03-30 MED ORDER — LEVOFLOXACIN IN D5W 750 MG/150ML IV SOLN
750.0000 mg | INTRAVENOUS | Status: DC
  Filled 2019-03-30: qty 150

## 2019-03-30 MED ORDER — METRONIDAZOLE IN NACL 5-0.79 MG/ML-% IV SOLN
500.0000 mg | Freq: Three times a day (TID) | INTRAVENOUS | Status: DC
  Administered 2019-03-30 – 2019-03-31 (×3): 500 mg via INTRAVENOUS
  Filled 2019-03-30 (×5): qty 100

## 2019-03-30 MED ORDER — SODIUM CHLORIDE 0.9 % IV SOLN
2.0000 g | Freq: Two times a day (BID) | INTRAVENOUS | Status: DC
  Administered 2019-03-30 – 2019-03-31 (×2): 2 g via INTRAVENOUS
  Filled 2019-03-30 (×3): qty 2

## 2019-03-30 MED ORDER — IPRATROPIUM-ALBUTEROL 0.5-2.5 (3) MG/3ML IN SOLN
3.0000 mL | Freq: Three times a day (TID) | RESPIRATORY_TRACT | Status: DC
  Administered 2019-03-30 – 2019-03-31 (×5): 3 mL via RESPIRATORY_TRACT
  Filled 2019-03-30 (×7): qty 3

## 2019-03-30 MED ORDER — IPRATROPIUM-ALBUTEROL 0.5-2.5 (3) MG/3ML IN SOLN
3.0000 mL | Freq: Four times a day (QID) | RESPIRATORY_TRACT | Status: DC | PRN
  Administered 2019-04-01 – 2019-04-02 (×3): 3 mL via RESPIRATORY_TRACT
  Filled 2019-03-30 (×4): qty 3

## 2019-03-30 NOTE — Progress Notes (Signed)
Pharmacy Antibiotic Note  Ryan Travis is a 78 y.o. male admitted on 03/29/2019 with sepsis and wound infection.  Pharmacy was initially consulted for Vancomycin, Levaquin, and Aztreonam dosing due to PCN allergy. Patient has tolerated cephalosporins in the past. Imagining shows possible osteomyelitis. Podiatry has been consulted for possible amputation.   After conversation with MD, aztreonam and levofloxacin will be changed to cefepime and flagyl.   Plan: Continue Vancomycin 1250 mg IV Q 24 hrs. Goal AUC 400-550. Expected AUC: 453.4 SCr used: 1.19 Expected Cmin: 10.6  Start Cefepime 2g IV every 12 hours  Flagyl 500mg  IV every 8 hours ordered    Height: 5\' 10"  (177.8 cm) Weight: 170 lb (77.1 kg) IBW/kg (Calculated) : 73  Temp (24hrs), Avg:98.2 F (36.8 C), Min:98.1 F (36.7 C), Max:98.2 F (36.8 C)  Recent Labs  Lab 03/29/19 1541 03/29/19 1552 03/30/19 0449  WBC 5.1  --  3.5*  CREATININE 1.36*  --  1.19  LATICACIDVEN  --  3.5*  --     Estimated Creatinine Clearance: 54.5 mL/min (by C-G formula based on SCr of 1.19 mg/dL).    Allergies  Allergen Reactions  . Azithromycin Other (See Comments)    Other reaction(s): Unknown   . Morphine And Related   . Neomycin   . Other     METHIOLATE  . Penicillins     Did it involve swelling of the face/tongue/throat, SOB, or low BP? Yes Did it involve sudden or severe rash/hives, skin peeling, or any reaction on the inside of your mouth or nose? Yes Did you need to seek medical attention at a hospital or doctor's office? Yes When did it last happen? 10 years If all above answers are "NO", may proceed with cephalosporin use.    Antimicrobials this admission: Vancomycin 5/24 >>  Levaquin 5/24 >> 5/25 Aztreonam 5/24 >> 5/25 Cefepime 5/25 >>  Flagyl 5/25 >>  Thank you for allowing pharmacy to be a part of this patient's care.  Pernell Dupre, PharmD, BCPS Clinical Pharmacist 03/30/2019 3:27 PM

## 2019-03-30 NOTE — Progress Notes (Signed)
Patient noted to have a critical platelet level of 25, MD paged. Dr Brett Albino called and informed at 06:43 of such.

## 2019-03-30 NOTE — Progress Notes (Signed)
Pt lethargic/weak; arouses and wakes for short intervals; calm/cooperative. Dsg applied by podiatry dry/intact on right great toe. IVF's continued. IV antibiotics administered as ordered. Noted 1 blood culture positive finding. Poor appetite;drank milk only. Afebrile. Wife has called x 4 today with same questions/updates given. Wife reports she is in a WC at home herself and pt has "not walked in while"; "couldn't get him to eat for 3 days". Pt educated on no ETOH while on metronidazole therapy. States, "I am a sick puppy."

## 2019-03-30 NOTE — Progress Notes (Signed)
[facility] NAME: Ryan Travis    MR#:  [id number]  DATE OF BIRTH:  [birth year]  SUBJECTIVE:  CHIEF COMPLAINT:   Chief Complaint  Patient presents with  . Shortness of Breath   came with drowsiness. Noted infection on toe.   REVIEW OF SYSTEMS:   Pt is drowsy, can not give ROS.  ROS  DRUG ALLERGIES:   Allergies  Allergen Reactions  . Azithromycin Other (See Comments)    Other reaction(s): Unknown   . Morphine And Related   . Neomycin   . Other     METHIOLATE  . Penicillins     Did it involve swelling of the face/tongue/throat, SOB, or low BP? Yes Did it involve sudden or severe rash/hives, skin peeling, or any reaction on the inside of your mouth or nose? Yes Did you need to seek medical attention at a hospital or doctor's office? Yes When did it last happen? 10 years If all above answers are "NO", may proceed with cephalosporin use.    VITALS:  Blood pressure 116/72, pulse (!) 108, temperature 98.2 F (36.8 C), temperature source Oral, resp. rate 20, height '5\' 10"'  (1.778 m), weight 77.1 kg, SpO2 97 %.  PHYSICAL EXAMINATION:   GENERAL:  78 y.o.-year-old unkempt patient lying in the bed in no acute distress.  EYES: Pupils equal, round, reactive to light and accommodation. No scleral icterus. Extraocular muscles intact.  HEENT: Head atraumatic, normocephalic. Oropharynx and nasopharynx clear. No oropharyngeal erythema, moist oral mucosa  NECK:  Supple, no jugular venous distention. No thyroid enlargement, no tenderness.  LUNGS: Normal breath sounds bilaterally, no wheezing, rales, rhonchi. No use of accessory muscles of respiration.  CARDIOVASCULAR: S1, S2 RRR. No murmurs, rubs, gallops, clicks.  ABDOMEN: Soft, nontender, nondistended. Bowel sounds present. No organomegaly or mass.  EXTREMITIES: No pedal edema, cyanosis, or clubbing. + 2 pedal & radial pulses b/l.   NEUROLOGIC: Cranial nerves II through XII are  intact. No focal Motor or sensory deficits appreciated b/l PSYCHIATRIC: The patient is drowsy.  SKIN: No obvious rash, lesion, or ulcer. Right Great toe ulcer.  Physical Exam LABORATORY PANEL:   CBC Recent Labs  Lab 03/30/19 0449  WBC 3.5*  HGB 11.7*  HCT 33.3*  PLT 25*   ------------------------------------------------------------------------------------------------------------------  Chemistries  Recent Labs  Lab 03/29/19 1541 03/30/19 0449  NA 136 134*  K 3.1* 3.4*  CL 96* 97*  CO2 18* 22  GLUCOSE 126* 172*  BUN 57* 46*  CREATININE 1.36* 1.19  CALCIUM 8.3* 7.7*  MG 1.7  --    ------------------------------------------------------------------------------------------------------------------  Cardiac Enzymes Recent Labs  Lab 03/29/19 1541  TROPONINI 0.17*   ------------------------------------------------------------------------------------------------------------------  RADIOLOGY:  Mr Foot Right Wo Contrast  Result Date: 03/29/2019 CLINICAL DATA:  Foot swelling, diabetic ulcer EXAM: MRI OF THE RIGHT FOREFOOT WITHOUT CONTRAST TECHNIQUE: Multiplanar, multisequence MR imaging of the right forefoot was performed. No intravenous contrast was administered. COMPARISON:  None. FINDINGS: Severe patient motion degrading image quality limiting evaluation. Bones/Joint/Cartilage Soft tissue wound along the medial aspect of mid first metatarsal. Mild marrow edema in the mid first metatarsal with irregularity along the medial cortex concerning for mild osteomyelitis. Soft tissue wound at the tip of the first distal phalanx. No osteomyelitis of the first distal phalanx, but evaluation is limited secondary to significant patient motion. No other marrow signal abnormality. No fracture or dislocation. Normal alignment. No joint effusion. Mild osteoarthritis of the first, fourth and fifth tarsometatarsal joints. Moderate  osteoarthritis of the second and third tarsometatarsal joints.  Ligaments Collateral ligaments are intact. Lisfranc ligament is intact. Lisfranc ligament is intact. Muscles and Tendons Flexor, peroneal and extensor compartment tendons are intact. Muscles are normal. Soft tissue No fluid collection or hematoma.  No soft tissue mass. IMPRESSION: 1. Soft tissue wound along the medial aspect of mid first metatarsal. Mild marrow edema in the mid first metatarsal with irregularity along the medial cortex concerning for mild osteomyelitis versus reactive changes secondary to adjacent inflammation. 2. Soft tissue wound at the tip of the first distal phalanx. No osteomyelitis of the first distal phalanx, but evaluation is limited secondary to significant patient motion. Electronically Signed   By: Kathreen Devoid   On: 03/29/2019 21:35   Dg Chest Port 1 View  Result Date: 03/29/2019 CLINICAL DATA:  Shortness of breath and cough EXAM: PORTABLE CHEST 1 VIEW COMPARISON:  January 08, 2019 FINDINGS: There is no edema or consolidation. Heart size and pulmonary vascularity are normal. No adenopathy. There is aortic atherosclerosis. Bones are osteoporotic. There is degenerative change in each shoulder with superior migration of the right and left humeral heads. There are surgical clips in the left axillary region. IMPRESSION: No edema or consolidation. Heart size normal. Aortic Atherosclerosis (ICD10-I70.0). Bones osteoporotic. Superior migration of each humeral head is indicative of chronic rotator cuff tears. Electronically Signed   By: Lowella Grip III M.D.   On: 03/29/2019 15:59   Dg Foot 2 Views Right  Result Date: 03/29/2019 CLINICAL DATA:  Open wound of the foot. Wound is located along the great toe. EXAM: RIGHT FOOT - 2 VIEW COMPARISON:  None. FINDINGS: Severe osteopenia. No acute fracture or dislocation. Prior tibiotalar and subtalar arthrodesis. Lucency around the distal intramedullary nail and interlocking screw consistent with loosening versus infection. Moderate  osteoarthritis of first tarsometatarsal joint. Moderate osteoarthritis of fifth MTP joint. Mild osteoarthritis of the second, third and fourth TMT joints. Mild osteoarthritis of the third MTP joint. No periosteal reaction or bone destruction. IMPRESSION: 1. No radiographic evidence of osteomyelitis of great toe. 2. Osteoarthritis of the right foot as described above. 3. Prior tibiotalar and subtalar arthrodesis. Lucency around the distal intramedullary nail and interlocking screw consistent with loosening versus infection. Electronically Signed   By: Kathreen Devoid   On: 03/29/2019 16:01    ASSESSMENT AND PLAN:   Active Problems:   Sepsis ([place])  78 y.o. male with a known history of alcohol abuse, anxiety, history of multiple myeloma, depression, neuropathy, essential hypertension, restless leg syndrome who presents from home due to cough, shortness of breath.  1.  Sepsis- patient meets criteria due to patient's elevated lactic acid, tachycardia and right great toe infection with foul-smelling discharge.  The source was thought to be the patient's right great toe.  Urinalysis is still pending and chest x-ray does not show any evidence of acute infection. - We will empirically treat the patient with vancomycin, aztreonam, Levaquin as patient is penicillin allergic. -Follow cultures.  2.  Right great toe ulcer with foul-smelling discharge- continue broad-spectrum IV antibiotics with vancomycin, aztreonam, Levaquin. MRI shows possible osteomyelitis, I will get podiatry consult.  3.  Acute kidney injury- secondary to alcohol abuse and dehydration. - We will gently hydrate the patient with IV fluids, follow BUN/creatinine urine output. -Renal dose meds, avoid nephrotoxins.  Renal function slightly better.  4.  Elevated troponin-likely in the setting of supply demand ischemia from acute kidney injury and underlying sepsis. -We will cycle cardiac markers.  Observe  on telemetry.  Check  echocardiogram.  5.  Thrombocytopenia-secondary to alcohol abuse. -Follow platelet count.  No acute bleeding presently. Avoid anticoagulation.  6.  History of coronary disease status post PCI and stent placement- patient has a mildly elevated troponin which is likely secondary to demand ischemia. - We will continue his Plavix, statin, carvedilol.  7.  Depression-continue Zoloft.  8.  Neuropathy-continue gabapentin.  9.  Altered mental status I will get CT head to rule out any abnormalities.  As per wife he is drowsy and more sleepy for last 1 week. We will also check liver function panel and ammonia.  All the records are reviewed and case discussed with Care Management/Social Workerr. Management plans discussed with the patient, family and they are in agreement.  CODE STATUS: Full code.  TOTAL TIME TAKING CARE OF THIS PATIENT: 35 minutes.   I spoke to patient's wife on the phone.  POSSIBLE D/C IN 1-2 DAYS, DEPENDING ON CLINICAL CONDITION.   Vaughan Basta M.D on 03/30/2019   Between 7am to 6pm - Pager - [phone]  After 6pm go to [email or web address] - password [email or web address] [facility]  Office  [phone]  CC: Primary care physician; [place]  Note: This dictation was prepared with Dragon dictation along with smaller phrase technology. Any transcriptional errors that result from this process are unintentional.

## 2019-03-30 NOTE — Consult Note (Signed)
Reason for Consult: Cellulitis with possible infection right great toe Referring Physician: Colbi Staubs is an 78 y.o. male.  HPI: This is a 78 year old male recently admitted with shortness of breath and lethargy.  Unable to obtain any significant history but patient does not recall how long his toe has been a problem.  Past Medical History:  Diagnosis Date  . Alcohol abuse   . Anxiety   . Arthritis   . 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 Cancer ([place])    multiple myeloma  . Depression   . Dysrhythmia   . Fall    3 weeks ago/ torn ligaments left knee/wearing brace/pt  . Hypertension   . Neuropathy   . Pain    chronic leg and ankle pain  . RLS (restless legs syndrome)     Past Surgical History:  Procedure Laterality Date  . BRAIN SURGERY     subdural hematoma  . CARDIAC CATHETERIZATION N/A 10/16/2016   Procedure: Left Heart Cath and Coronary Angiography;  Surgeon: Nelva Bush, MD;  Location: [facility];  Service: Cardiovascular;  Laterality: N/A;  . CARDIAC CATHETERIZATION N/A 10/16/2016   Procedure: Coronary Stent Intervention;  Surgeon: Nelva Bush, MD;  Location: [facility];  Service: Cardiovascular;  Laterality: N/A;  . CARDIAC CATHETERIZATION N/A 10/16/2016   Procedure: Temporary Pacemaker;  Surgeon: Nelva Bush, MD;  Location: [facility];  Service: Cardiovascular;  Laterality: N/A;  . CATARACT EXTRACTION W/PHACO Right 08/16/2016   Procedure: CATARACT EXTRACTION PHACO AND INTRAOCULAR LENS PLACEMENT (IOC);  Surgeon: Eulogio Bear, MD;  Location: [facility];  Service: Ophthalmology;  Laterality: Right;  Lot # [id number] H [place]: 01:00.8 AP%:16.5 CDE: 10.02   . CATARACT EXTRACTION W/PHACO Left 10/04/2016   Procedure: CATARACT EXTRACTION PHACO AND INTRAOCULAR LENS PLACEMENT (IOC);  Surgeon: Eulogio Bear, MD;  Location: [facility];  Service: Ophthalmology;  Laterality: Left;  [place] 35.4 AP% 10.5 CDE 4.11 Fluid pack lot #  [id number]  . EYE SURGERY    . FRACTURE SURGERY     multiple right ankle  . HERNIA REPAIR    . JOINT REPLACEMENT Bilateral    thr  . KNEE ARTHROSCOPY W/ ACL RECONSTRUCTION     left x 2  . rcr      Family History  Problem Relation Age of Onset  . Heart attack Mother   . Heart attack Father     Social History:  reports that he has been smoking cigarettes. He has a 6.00 pack-year smoking history. He has never used smokeless tobacco. He reports current alcohol use. He reports that he does not use drugs.  Allergies:  Allergies  Allergen Reactions  . Azithromycin Other (See Comments)    Other reaction(s): Unknown   . Morphine And Related   . Neomycin   . Other     METHIOLATE  . Penicillins     Did it involve swelling of the face/tongue/throat, SOB, or low BP? Yes Did it involve sudden or severe rash/hives, skin peeling, or any reaction on the inside of your mouth or nose? Yes Did you need to seek medical attention at a hospital or doctor's office? Yes When did it last happen? 10 years If all above answers are "NO", may proceed with cephalosporin use.    Medications:  Scheduled: . budesonide (PULMICORT) nebulizer solution  0.5 mg Nebulization BID  . ipratropium-albuterol  3 mL Nebulization TID    Results for orders placed or performed during the hospital  encounter of 03/29/19 (from the past 48 hour(s))  CBC with Differential     Status: Abnormal   Collection Time: 03/29/19  3:41 PM  Result Value Ref Range   WBC 5.1 4.0 - 10.5 K/uL   RBC 3.84 (L) 4.22 - 5.81 MIL/uL   Hemoglobin 13.5 13.0 - 17.0 g/dL   HCT 39.6 39.0 - 52.0 %   MCV 103.1 (H) 80.0 - 100.0 fL   MCH 35.2 (H) 26.0 - 34.0 pg   MCHC 34.1 30.0 - 36.0 g/dL   RDW 13.2 11.5 - 15.5 %   Platelets 33 (L) 150 - 400 K/uL    Comment: Immature Platelet Fraction may be clinically indicated, consider ordering this additional test [id number]    nRBC 0.0 0.0 - 0.2 %   Neutrophils Relative % 70 %   Neutro Abs 3.5 1.7 -  7.7 K/uL   Lymphocytes Relative 18 %   Lymphs Abs 0.9 0.7 - 4.0 K/uL   Monocytes Relative 11 %   Monocytes Absolute 0.6 0.1 - 1.0 K/uL   Eosinophils Relative 0 %   Eosinophils Absolute 0.0 0.0 - 0.5 K/uL   Basophils Relative 0 %   Basophils Absolute 0.0 0.0 - 0.1 K/uL   Immature Granulocytes 1 %   Abs Immature Granulocytes 0.05 0.00 - 0.07 K/uL    Comment: Performed at [facility], [address] [place], [place] [address]  Basic metabolic panel     Status: Abnormal   Collection Time: 03/29/19  3:41 PM  Result Value Ref Range   Sodium 136 135 - 145 mmol/L    Comment: ELECTROLYTES REPEATED AKT   Potassium 3.1 (L) 3.5 - 5.1 mmol/L   Chloride 96 (L) 98 - 111 mmol/L   CO2 18 (L) 22 - 32 mmol/L   Glucose, Bld 126 (H) 70 - 99 mg/dL   BUN 57 (H) 8 - 23 mg/dL   Creatinine, Ser 1.36 (H) 0.61 - 1.24 mg/dL   Calcium 8.3 (L) 8.9 - 10.3 mg/dL   GFR calc non Af Amer 50 (L) >60 mL/min   GFR calc Af Amer 58 (L) >60 mL/min   Anion gap 22 (H) 5 - 15    Comment: Performed at [facility], [address] [place], [place] [address]  Brain natriuretic peptide     Status: Abnormal   Collection Time: 03/29/19  3:41 PM  Result Value Ref Range   B Natriuretic Peptide 249.0 (H) 0.0 - 100.0 pg/mL    Comment: Performed at [facility], [address] [place], [place] [address]  Troponin I - ONCE - STAT     Status: Abnormal   Collection Time: 03/29/19  3:41 PM  Result Value Ref Range   Troponin I 0.17 (HH) <0.03 ng/mL    Comment: CRITICAL RESULT CALLED TO, READ BACK BY AND VERIFIED WITH JESSICA COLTRANE '@[id number]'  03/29/19 AKT Performed at [facility], [address], [place], [place] [address]   SARS Coronavirus 2 (CEPHEID- Performed in [facility] hospital lab), Hosp Order     Status: None   Collection Time: 03/29/19  3:41 PM  Result Value Ref Range   SARS Coronavirus 2 NEGATIVE NEGATIVE    Comment: (NOTE) If result is NEGATIVE SARS-CoV-2 target nucleic acids  are NOT DETECTED. The SARS-CoV-2 RNA is generally detectable in upper and lower  respiratory specimens during the acute phase of infection. The lowest  concentration of SARS-CoV-2 viral copies this assay can detect is 250  copies / mL.  A negative result does not preclude SARS-CoV-2 infection  and should not be used as the sole basis for treatment or other  patient management decisions.  A negative result may occur with  improper specimen collection / handling, submission of specimen other  than nasopharyngeal swab, presence of viral mutation(s) within the  areas targeted by this assay, and inadequate number of viral copies  (<250 copies / mL). A negative result must be combined with clinical  observations, patient history, and epidemiological information. If result is POSITIVE SARS-CoV-2 target nucleic acids are DETECTED. The SARS-CoV-2 RNA is generally detectable in upper and lower  respiratory specimens dur ing the acute phase of infection.  Positive  results are indicative of active infection with SARS-CoV-2.  Clinical  correlation with patient history and other diagnostic information is  necessary to determine patient infection status.  Positive results do  not rule out bacterial infection or co-infection with other viruses. If result is PRESUMPTIVE POSTIVE SARS-CoV-2 nucleic acids MAY BE PRESENT.   A presumptive positive result was obtained on the submitted specimen  and confirmed on repeat testing.  While 2019 novel coronavirus  (SARS-CoV-2) nucleic acids may be present in the submitted sample  additional confirmatory testing may be necessary for epidemiological  and / or clinical management purposes  to differentiate between  SARS-CoV-2 and other Sarbecovirus currently known to infect humans.  If clinically indicated additional testing with an alternate test  methodology [phone]) is advised. The SARS-CoV-2 RNA is generally  detectable in upper and lower respiratory sp ecimens  during the acute  phase of infection. The expected result is Negative. Fact Sheet for Patients:  StrictlyIdeas.no Fact Sheet for Healthcare Providers: BankingDealers.co.za This test is not yet approved or cleared by the [place] FDA and has been authorized for detection and/or diagnosis of SARS-CoV-2 by FDA under an Emergency Use Authorization (EUA).  This EUA will remain in effect (meaning this test can be used) for the duration of the COVID-19 declaration under Section 564(b)(1) of the Act, 21 U.S.C. section 360bbb-3(b)(1), unless the authorization is terminated or revoked sooner. Performed at [facility], [address] [place], [place] [address]   Sedimentation rate     Status: None   Collection Time: 03/29/19  3:41 PM  Result Value Ref Range   Sed Rate 5 0 - 20 mm/hr    Comment: Performed at [facility], [address] [place], [place] [address]  Magnesium     Status: None   Collection Time: 03/29/19  3:41 PM  Result Value Ref Range   Magnesium 1.7 1.7 - 2.4 mg/dL    Comment: Performed at [facility], [address] [place], [place] [address]  Lactic acid, plasma     Status: Abnormal   Collection Time: 03/29/19  3:52 PM  Result Value Ref Range   Lactic Acid, Venous 3.5 (HH) 0.5 - 1.9 mmol/L    Comment: CRITICAL RESULT CALLED TO, READ BACK BY AND VERIFIED WITH JESSICA COLTRANE '@1634'  03/29/19 AKT Performed at [facility], [address] [place], [place] [address]   Blood culture (routine x 2)     Status: None (Preliminary result)   Collection Time: 03/29/19  3:52 PM  Result Value Ref Range   Specimen Description BLOOD LEFT ANTECUBITAL    Special Requests      BOTTLES DRAWN AEROBIC AND ANAEROBIC Blood Culture results may not be optimal due to an excessive volume of blood received in culture bottles   Culture  NO GROWTH < 24 HOURS Performed at [facility], [address] [place], [place] [address]    Report Status PENDING   Blood culture (routine x 2)     Status: None (Preliminary result)   Collection Time: 03/29/19  3:52 PM  Result Value Ref Range   Specimen Description BLOOD BLOOD LEFT WRIST    Special Requests      BOTTLES DRAWN AEROBIC AND ANAEROBIC Blood Culture results may not be optimal due to an excessive volume of blood received in culture bottles   Culture      NO GROWTH < 24 HOURS Performed at [facility], [address] [place], [place] [address]    Report Status PENDING   Urinalysis, Complete w Microscopic     Status: Abnormal   Collection Time: 03/30/19  3:38 AM  Result Value Ref Range   Color, Urine YELLOW (A) YELLOW   APPearance CLEAR (A) CLEAR   Specific Gravity, Urine 1.014 1.005 - 1.030   pH 5.0 5.0 - 8.0   Glucose, UA >=500 (A) NEGATIVE mg/dL   Hgb urine dipstick MODERATE (A) NEGATIVE   Bilirubin Urine NEGATIVE NEGATIVE   Ketones, ur 20 (A) NEGATIVE mg/dL   Protein, ur 30 (A) NEGATIVE mg/dL   Nitrite NEGATIVE NEGATIVE   Leukocytes,Ua SMALL (A) NEGATIVE   RBC / HPF 0-5 0 - 5 RBC/hpf   WBC, UA 11-20 0 - 5 WBC/hpf   Bacteria, UA RARE (A) NONE SEEN   Squamous Epithelial / LPF 0-5 0 - 5   Mucus PRESENT     Comment: Performed at [facility], [address] [place], [place] [address]  Basic metabolic panel     Status: Abnormal   Collection Time: 03/30/19  4:49 AM  Result Value Ref Range   Sodium 134 (L) 135 - 145 mmol/L   Potassium 3.4 (L) 3.5 - 5.1 mmol/L   Chloride 97 (L) 98 - 111 mmol/L   CO2 22 22 - 32 mmol/L   Glucose, Bld 172 (H) 70 - 99 mg/dL   BUN 46 (H) 8 - 23 mg/dL   Creatinine, Ser 1.19 0.61 - 1.24 mg/dL   Calcium 7.7 (L) 8.9 - 10.3 mg/dL   GFR calc non Af Amer 59 (L) >60 mL/min   GFR calc Af Amer >60 >60 mL/min   Anion gap 15 5 - 15    Comment: Performed at [facility], [address] [place], [place] [address]  CBC     Status: Abnormal   Collection Time:  03/30/19  4:49 AM  Result Value Ref Range   WBC 3.5 (L) 4.0 - 10.5 K/uL   RBC 3.32 (L) 4.22 - 5.81 MIL/uL   Hemoglobin 11.7 (L) 13.0 - 17.0 g/dL   HCT 33.3 (L) 39.0 - 52.0 %   MCV 100.3 (H) 80.0 - 100.0 fL   MCH 35.2 (H) 26.0 - 34.0 pg   MCHC 35.1 30.0 - 36.0 g/dL   RDW 12.8 11.5 - 15.5 %   Platelets 25 (LL) 150 - 400 K/uL    Comment: PLATELET COUNT CONFIRMED BY SMEAR Immature Platelet Fraction may be clinically indicated, consider ordering this additional test [id number] THIS CRITICAL RESULT HAS VERIFIED AND BEEN CALLED TO TONYA RUTHARFORD BY HINA PATEL ON 05 25 2020 AT 0625, AND HAS BEEN READ BACK.     nRBC 0.0 0.0 - 0.2 %    Comment: Performed at [facility], [address] [place],  [address]  Mr Foot Right Wo Contrast  Result Date: 03/29/2019 CLINICAL DATA:  Foot swelling, diabetic ulcer EXAM: MRI OF THE RIGHT FOREFOOT WITHOUT CONTRAST TECHNIQUE: Multiplanar, multisequence MR imaging of the right forefoot was performed. No intravenous contrast was administered. COMPARISON:  None. FINDINGS: Severe patient motion degrading image quality limiting evaluation. Bones/Joint/Cartilage Soft tissue wound along the medial aspect of mid first metatarsal. Mild marrow edema in the mid first metatarsal with irregularity along the medial cortex concerning for mild osteomyelitis. Soft tissue wound at the tip of the first distal phalanx. No osteomyelitis of the first distal phalanx, but evaluation is limited secondary to significant patient motion. No other marrow signal abnormality. No fracture or dislocation. Normal alignment. No joint effusion. Mild osteoarthritis of the first, fourth and fifth tarsometatarsal joints. Moderate osteoarthritis of the second and third tarsometatarsal joints. Ligaments Collateral ligaments are intact. Lisfranc ligament is intact. Lisfranc ligament is intact. Muscles and Tendons Flexor, peroneal and extensor compartment tendons are intact. Muscles are  normal. Soft tissue No fluid collection or hematoma.  No soft tissue mass. IMPRESSION: 1. Soft tissue wound along the medial aspect of mid first metatarsal. Mild marrow edema in the mid first metatarsal with irregularity along the medial cortex concerning for mild osteomyelitis versus reactive changes secondary to adjacent inflammation. 2. Soft tissue wound at the tip of the first distal phalanx. No osteomyelitis of the first distal phalanx, but evaluation is limited secondary to significant patient motion. Electronically Signed   By: Kathreen Devoid   On: 03/29/2019 21:35   Dg Chest Port 1 View  Result Date: 03/29/2019 CLINICAL DATA:  Shortness of breath and cough EXAM: PORTABLE CHEST 1 VIEW COMPARISON:  January 08, 2019 FINDINGS: There is no edema or consolidation. Heart size and pulmonary vascularity are normal. No adenopathy. There is aortic atherosclerosis. Bones are osteoporotic. There is degenerative change in each shoulder with superior migration of the right and left humeral heads. There are surgical clips in the left axillary region. IMPRESSION: No edema or consolidation. Heart size normal. Aortic Atherosclerosis (ICD10-I70.0). Bones osteoporotic. Superior migration of each humeral head is indicative of chronic rotator cuff tears. Electronically Signed   By: Lowella Grip III M.D.   On: 03/29/2019 15:59   Dg Foot 2 Views Right  Result Date: 03/29/2019 CLINICAL DATA:  Open wound of the foot. Wound is located along the great toe. EXAM: RIGHT FOOT - 2 VIEW COMPARISON:  None. FINDINGS: Severe osteopenia. No acute fracture or dislocation. Prior tibiotalar and subtalar arthrodesis. Lucency around the distal intramedullary nail and interlocking screw consistent with loosening versus infection. Moderate osteoarthritis of first tarsometatarsal joint. Moderate osteoarthritis of fifth MTP joint. Mild osteoarthritis of the second, third and fourth TMT joints. Mild osteoarthritis of the third MTP joint. No  periosteal reaction or bone destruction. IMPRESSION: 1. No radiographic evidence of osteomyelitis of great toe. 2. Osteoarthritis of the right foot as described above. 3. Prior tibiotalar and subtalar arthrodesis. Lucency around the distal intramedullary nail and interlocking screw consistent with loosening versus infection. Electronically Signed   By: Kathreen Devoid   On: 03/29/2019 16:01    Review of Systems  Unable to perform ROS: Medical condition   Blood pressure 116/72, pulse (!) 108, temperature 98.2 F (36.8 C), temperature source Oral, resp. rate 20, height '5\' 10"'  (1.778 m), weight 77.1 kg, SpO2 96 %. Physical Exam  Cardiovascular:  DP pulse is 2/4 on the left weak 1/4 on the right.  PT pulse 1/4 on the left and cannot clearly  be palpated on the right.  Capillary filling time intact to the digits.  Musculoskeletal:     Comments: Deformity noted in the right ankle from previous fracture and surgery.  Significant digital contractures in the toes on the right foot with limited motion and obvious arthritic changes.  No acute instability.  Muscle testing deferred.  Neurological:  Neurological status could not be assessed accurately but he does have a history of neuropathy.  Skin:  The skin is warm dry and atrophic bilateral with diminished hair growth.  Multiple dried eschar areas are noted on the right foot including the right hallux nail bed as well as distally on the third toe.  No cellulitis or focal edema is noted in the toes.  No active drainage or purulence.  Tissues do not appear necrotic.    Assessment/Plan: Assessment: 1.  Most likely traumatic avulsion of the right hallux nail.  No sign of infection. 2.  Significant arthritis with hammertoe contractures.  Plan: Light bandage applied to the right great toe.  At this point would just recommend observation as I do not clearly see any signs of deeper infection or osteomyelitis.  This was discussed with radiology who also did not  clearly see osteomyelitis.  Again I think with his neuropathy this may have been a traumatic avulsion.  At this point would recommend observation especially with his significantly decreased platelet count.  Plan on follow-up outpatient or reconsult if any other concerns.  Durward Fortes 03/30/2019, 12:00 PM

## 2019-03-30 NOTE — Progress Notes (Signed)
PHARMACY - PHYSICIAN COMMUNICATION CRITICAL VALUE ALERT - BLOOD CULTURE IDENTIFICATION (BCID)  Ryan Travis is an 78 y.o. male who presented to [facility] on 03/29/2019 with a chief complaint of wound infection and sepsis   Assessment:  Pharmacy was initially consulted for Vancomycin, Levaquin, and Aztreonam dosing due to PCN allergy. Patient has tolerated cephalosporins in the past. Imagining shows possible osteomyelitis. Podiatry has been consulted for possible amputation.    Name of physician (or Provider) Contacted: Dr. Anselm Jungling   Current antibiotics: Vancomycin, Levaquin, and Aztreonam  Changes to prescribed antibiotics recommended:   After conversation with MD, aztreonam and levofloxacin will be changed to cefepime and flagyl.   Results for orders placed or performed during the hospital encounter of 03/29/19  Blood Culture ID Panel (Reflexed) (Collected: 03/29/2019  3:52 PM)  Result Value Ref Range   Enterococcus species NOT DETECTED NOT DETECTED   Listeria monocytogenes NOT DETECTED NOT DETECTED   Staphylococcus species NOT DETECTED NOT DETECTED   Staphylococcus aureus (BCID) NOT DETECTED NOT DETECTED   Streptococcus species NOT DETECTED NOT DETECTED   Streptococcus agalactiae NOT DETECTED NOT DETECTED   Streptococcus pneumoniae NOT DETECTED NOT DETECTED   Streptococcus pyogenes NOT DETECTED NOT DETECTED   Acinetobacter baumannii NOT DETECTED NOT DETECTED   Enterobacteriaceae species NOT DETECTED NOT DETECTED   Enterobacter cloacae complex NOT DETECTED NOT DETECTED   Escherichia coli NOT DETECTED NOT DETECTED   Klebsiella oxytoca NOT DETECTED NOT DETECTED   Klebsiella pneumoniae NOT DETECTED NOT DETECTED   Proteus species NOT DETECTED NOT DETECTED   Serratia marcescens NOT DETECTED NOT DETECTED   Haemophilus influenzae DETECTED (A) NOT DETECTED   Neisseria meningitidis NOT DETECTED NOT DETECTED   Pseudomonas aeruginosa NOT DETECTED NOT DETECTED   Candida albicans NOT  DETECTED NOT DETECTED   Candida glabrata NOT DETECTED NOT DETECTED   Candida krusei NOT DETECTED NOT DETECTED   Candida parapsilosis NOT DETECTED NOT DETECTED   Candida tropicalis NOT DETECTED NOT DETECTED    Pernell Dupre, PharmD, BCPS Clinical Pharmacist 03/30/2019 3:32 PM

## 2019-03-30 NOTE — Progress Notes (Signed)
*  PRELIMINARY RESULTS* Echocardiogram 2D Echocardiogram has been performed.  Ryan Travis 03/30/2019, 10:16 AM

## 2019-03-30 NOTE — Progress Notes (Deleted)
Pharmacy Antibiotic Note  Ryan Travis is a 78 y.o. male admitted on 03/29/2019 with sepsis and wound infection.  Pharmacy has been consulted for Vancomycin, Levaquin, and Aztreonam dosing.  Plan: Vancomycin 1250 mg IV Q 24 hrs. Goal AUC 400-550. Expected AUC: 511.7 SCr used: 1.36 Expected Cmin: 12.8  Change Levaquin to 750mg  IV q24h  Aztreonam 1g IV q8h   Height: 5\' 10"  (177.8 cm) Weight: 170 lb (77.1 kg) IBW/kg (Calculated) : 73  Temp (24hrs), Avg:98.2 F (36.8 C), Min:98.1 F (36.7 C), Max:98.2 F (36.8 C)  Recent Labs  Lab 03/29/19 1541 03/29/19 1552 03/30/19 0449  WBC 5.1  --  3.5*  CREATININE 1.36*  --  1.19  LATICACIDVEN  --  3.5*  --     Estimated Creatinine Clearance: 54.5 mL/min (by C-G formula based on SCr of 1.19 mg/dL).    Allergies  Allergen Reactions  . Azithromycin Other (See Comments)    Other reaction(s): Unknown   . Morphine And Related   . Neomycin   . Other     METHIOLATE  . Penicillins     Did it involve swelling of the face/tongue/throat, SOB, or low BP? Yes Did it involve sudden or severe rash/hives, skin peeling, or any reaction on the inside of your mouth or nose? Yes Did you need to seek medical attention at a hospital or doctor's office? Yes When did it last happen? 10 years If all above answers are "NO", may proceed with cephalosporin use.    Antimicrobials this admission: Vancomycin 5/24 >>  Levaquin 5/24 >>  Aztreonam 5/24 >>  Thank you for allowing pharmacy to be a part of this patient's care.  Paulina Fusi, PharmD, BCPS 03/30/2019 2:19 PM

## 2019-03-30 NOTE — Progress Notes (Signed)
Another TC from wife wanting to make sure nurse knows that pt "is a heavy drinker"; " a pint a day". Educated on no ETOH if continues on metronidizole and adverse effects with ETOH use and time frame; states she understood and stated, "he can go that long". She requested MD call her with update. Dr.Vachhani notified of wife's reports of ETOH use and wished to speak to him.  States he will contact her and has correct contact phone number.

## 2019-03-31 DIAGNOSIS — X58XXXA Exposure to other specified factors, initial encounter: Secondary | ICD-10-CM

## 2019-03-31 DIAGNOSIS — Z888 Allergy status to other drugs, medicaments and biological substances status: Secondary | ICD-10-CM

## 2019-03-31 DIAGNOSIS — C9000 Multiple myeloma not having achieved remission: Secondary | ICD-10-CM

## 2019-03-31 DIAGNOSIS — R7881 Bacteremia: Secondary | ICD-10-CM

## 2019-03-31 DIAGNOSIS — M2042 Other hammer toe(s) (acquired), left foot: Secondary | ICD-10-CM

## 2019-03-31 DIAGNOSIS — G9340 Encephalopathy, unspecified: Secondary | ICD-10-CM

## 2019-03-31 DIAGNOSIS — I10 Essential (primary) hypertension: Secondary | ICD-10-CM

## 2019-03-31 DIAGNOSIS — Z720 Tobacco use: Secondary | ICD-10-CM

## 2019-03-31 DIAGNOSIS — B963 Hemophilus influenzae [H. influenzae] as the cause of diseases classified elsewhere: Secondary | ICD-10-CM

## 2019-03-31 DIAGNOSIS — Z880 Allergy status to penicillin: Secondary | ICD-10-CM

## 2019-03-31 DIAGNOSIS — F1010 Alcohol abuse, uncomplicated: Secondary | ICD-10-CM

## 2019-03-31 DIAGNOSIS — Z885 Allergy status to narcotic agent status: Secondary | ICD-10-CM

## 2019-03-31 DIAGNOSIS — S91201A Unspecified open wound of right great toe with damage to nail, initial encounter: Secondary | ICD-10-CM

## 2019-03-31 DIAGNOSIS — D696 Thrombocytopenia, unspecified: Secondary | ICD-10-CM

## 2019-03-31 DIAGNOSIS — Z881 Allergy status to other antibiotic agents status: Secondary | ICD-10-CM

## 2019-03-31 DIAGNOSIS — M2041 Other hammer toe(s) (acquired), right foot: Secondary | ICD-10-CM

## 2019-03-31 DIAGNOSIS — D509 Iron deficiency anemia, unspecified: Secondary | ICD-10-CM

## 2019-03-31 DIAGNOSIS — E876 Hypokalemia: Secondary | ICD-10-CM

## 2019-03-31 DIAGNOSIS — Z79899 Other long term (current) drug therapy: Secondary | ICD-10-CM

## 2019-03-31 DIAGNOSIS — Z87820 Personal history of traumatic brain injury: Secondary | ICD-10-CM

## 2019-03-31 DIAGNOSIS — R740 Nonspecific elevation of levels of transaminase and lactic acid dehydrogenase [LDH]: Secondary | ICD-10-CM

## 2019-03-31 LAB — CBC
HCT: 29.1 % — ABNORMAL LOW (ref 39.0–52.0)
Hemoglobin: 10.1 g/dL — ABNORMAL LOW (ref 13.0–17.0)
MCH: 35.2 pg — ABNORMAL HIGH (ref 26.0–34.0)
MCHC: 34.7 g/dL (ref 30.0–36.0)
MCV: 101.4 fL — ABNORMAL HIGH (ref 80.0–100.0)
Platelets: 37 10*3/uL — ABNORMAL LOW (ref 150–400)
RBC: 2.87 MIL/uL — ABNORMAL LOW (ref 4.22–5.81)
RDW: 12.9 % (ref 11.5–15.5)
WBC: 3.5 10*3/uL — ABNORMAL LOW (ref 4.0–10.5)
nRBC: 0 % (ref 0.0–0.2)

## 2019-03-31 LAB — COMPREHENSIVE METABOLIC PANEL
ALT: 54 U/L — ABNORMAL HIGH (ref 0–44)
AST: 133 U/L — ABNORMAL HIGH (ref 15–41)
Albumin: 2.3 g/dL — ABNORMAL LOW (ref 3.5–5.0)
Alkaline Phosphatase: 162 U/L — ABNORMAL HIGH (ref 38–126)
Anion gap: 7 (ref 5–15)
BUN: 23 mg/dL (ref 8–23)
CO2: 25 mmol/L (ref 22–32)
Calcium: 7.3 mg/dL — ABNORMAL LOW (ref 8.9–10.3)
Chloride: 104 mmol/L (ref 98–111)
Creatinine, Ser: 0.62 mg/dL (ref 0.61–1.24)
GFR calc Af Amer: >60 mL/min (ref >60)
GFR calc non Af Amer: >60 mL/min (ref >60)
Glucose, Bld: 103 mg/dL — ABNORMAL HIGH (ref 70–99)
Potassium: 3.3 mmol/L — ABNORMAL LOW (ref 3.5–5.1)
Sodium: 136 mmol/L (ref 135–145)
Total Bilirubin: 1.1 mg/dL (ref 0.3–1.2)
Total Protein: 5 g/dL — ABNORMAL LOW (ref 6.5–8.1)

## 2019-03-31 LAB — TROPONIN I: Troponin I: 0.04 ng/mL (ref <0.03)

## 2019-03-31 LAB — AMMONIA: Ammonia: 33 umol/L (ref 9–35)

## 2019-03-31 LAB — TSH: TSH: 2.207 u[IU]/mL (ref 0.350–4.500)

## 2019-03-31 MED ORDER — HYDROCHLOROTHIAZIDE 25 MG PO TABS
25.0000 mg | ORAL_TABLET | Freq: Every day | ORAL | Status: DC
  Administered 2019-04-01 – 2019-04-02 (×2): 25 mg via ORAL
  Filled 2019-03-31 (×2): qty 1

## 2019-03-31 MED ORDER — FOLIC ACID 1 MG PO TABS
1.0000 mg | ORAL_TABLET | Freq: Every day | ORAL | Status: DC
  Administered 2019-04-01 – 2019-04-02 (×2): 1 mg via ORAL
  Filled 2019-03-31 (×2): qty 1

## 2019-03-31 MED ORDER — SODIUM CHLORIDE 0.9 % IV SOLN
2.0000 g | INTRAVENOUS | Status: DC
  Administered 2019-03-31 – 2019-04-02 (×3): 2 g via INTRAVENOUS
  Filled 2019-03-31: qty 2
  Filled 2019-03-31: qty 20
  Filled 2019-03-31: qty 2
  Filled 2019-03-31: qty 20

## 2019-03-31 MED ORDER — SERTRALINE HCL 50 MG PO TABS: 200.0000 mg | ORAL_TABLET | Freq: Every day | ORAL | Status: DC

## 2019-03-31 MED ORDER — SODIUM CHLORIDE 0.9 % IV SOLN: 1.0000 g | Freq: Two times a day (BID) | INTRAVENOUS | Status: DC

## 2019-03-31 MED ORDER — POTASSIUM CHLORIDE CRYS ER 20 MEQ PO TBCR
40.0000 meq | EXTENDED_RELEASE_TABLET | Freq: Once | ORAL | Status: AC
  Administered 2019-03-31: 40 meq via ORAL
  Filled 2019-03-31: qty 2

## 2019-03-31 MED ORDER — SERTRALINE HCL 50 MG PO TABS
100.0000 mg | ORAL_TABLET | Freq: Every day | ORAL | Status: DC
  Administered 2019-03-31 – 2019-04-01 (×2): 100 mg via ORAL
  Filled 2019-03-31 (×2): qty 2

## 2019-03-31 MED ORDER — LOSARTAN POTASSIUM 25 MG PO TABS
25.0000 mg | ORAL_TABLET | Freq: Every day | ORAL | Status: DC
  Administered 2019-04-01 – 2019-04-02 (×2): 25 mg via ORAL
  Filled 2019-03-31 (×2): qty 1

## 2019-03-31 MED ORDER — CARVEDILOL 3.125 MG PO TABS
6.2500 mg | ORAL_TABLET | Freq: Two times a day (BID) | ORAL | Status: DC
  Administered 2019-03-31 – 2019-04-02 (×4): 6.25 mg via ORAL
  Filled 2019-03-31 (×4): qty 2

## 2019-03-31 MED ORDER — SIMVASTATIN 20 MG PO TABS
40.0000 mg | ORAL_TABLET | Freq: Every day | ORAL | Status: DC
  Administered 2019-03-31 – 2019-04-01 (×2): 40 mg via ORAL
  Filled 2019-03-31 (×2): qty 2

## 2019-03-31 NOTE — Consult Note (Signed)
NAME: Ryan Travis  DOB: [birth year]  MRN: [id number]  Date/Time: 03/31/2019 3:18 PM  REQUESTING PROVIDER: Anselm Jungling Subjective:  REASON FOR CONSULT:: Bacteremia ?Patient is a limited historian.  Chart reviewed. Ryan Travis is a 78 y.o. male with a history of hypertension, alcohol abuse, myeloma, history of subdural hematoma presents from home with altered mental status and cough.  Patient lives with his wife and when EMS arrived at home he was sitting in a wheelchair slumped over slightly but was responding and talking to them.  He was not fully alert.  His wife had told EMS that he has had a cough and some respiratory distress for the past week it had gotten worse recently.  He has been smoking.  EMS also noted that he had a wound on his right great toe but neither his wife nor the patient was aware of how that happened.  His vitals as recorded by EMS on 524 was blood pressure of 118/87, temperature of 98.2, respiratory rate of 30, blood glucose of 163 with a PO2 of 92%.  In the ED his vitals were pulse rate of 52, saturation of 99%, and he was afebrile.  Blood culture was sent.  And he was started on vancomycin and aztreonam as he had an allergy to penicillin.  He had a chest x-ray done which showed no edema or consolidation.  SARS-CoV-2 test was negative.  He also had a CT head which showed no acute intracranial pathology.  Small vessel white matter disease and global volume loss was noted.  And a nonacute right cerebellar infarction was noted.  And evidence of prior left frontal and parietal burr hole craniotomies were noted.  MRI of the foot was also done and it was noted that he could have either edema secondary to surrounding inflammation or early osteomyelitis.  He was also seen by the podiatrist for the foot wound and it was noted that it was avulsion of the right great toenail with no obvious infection or osteomyelitis.  No surgical intervention was deemed necessary.  His blood cultures have  come back positive for Haemophilus influenza.  I am asked to see the patient. Patient is more alert today, and he says he has had this cough for a few days.  He says he has been cutting down on his alcohol.  He says when he is discharged he will go to the [place]. 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[place])    Cancer ([place])    multiple myeloma   Depression    Dysrhythmia    Fall    3 weeks ago/ torn ligaments left knee/wearing brace/pt   Hypertension    Neuropathy    Pain    chronic leg and ankle pain   RLS (restless legs syndrome)     Past Surgical History:  Procedure Laterality Date   BRAIN SURGERY     subdural hematoma   CARDIAC CATHETERIZATION N/A 10/16/2016   Procedure: Left Heart Cath and Coronary Angiography;  Surgeon: Nelva Bush, MD;  Location: [facility];  Service: Cardiovascular;  Laterality: N/A;   CARDIAC CATHETERIZATION N/A 10/16/2016   Procedure: Coronary Stent Intervention;  Surgeon: Nelva Bush, MD;  Location: [facility];  Service: Cardiovascular;  Laterality: N/A;   CARDIAC CATHETERIZATION N/A 10/16/2016   Procedure: Temporary Pacemaker;  Surgeon: Nelva Bush, MD;  Location: [facility];  Service:  Cardiovascular;  Laterality: N/A;   CATARACT EXTRACTION W/PHACO Right 08/16/2016   Procedure: CATARACT EXTRACTION PHACO AND INTRAOCULAR LENS PLACEMENT (IOC);  Surgeon: Eulogio Bear, MD;  Location: [facility];  Service: Ophthalmology;  Laterality: Right;  Lot # [id number] H [place]: 01:00.8 AP%:16.5 CDE: 10.02    CATARACT EXTRACTION W/PHACO Left 10/04/2016   Procedure: CATARACT EXTRACTION PHACO AND INTRAOCULAR LENS PLACEMENT (IOC);  Surgeon: Eulogio Bear, MD;  Location: [facility];  Service: Ophthalmology;  Laterality: Left;  [place] 35.4 AP% 10.5 CDE 4.11 Fluid pack lot # [id number]   EYE SURGERY     FRACTURE SURGERY     multiple right ankle   HERNIA  REPAIR     JOINT REPLACEMENT Bilateral    thr   KNEE ARTHROSCOPY W/ ACL RECONSTRUCTION     left x 2   rcr      Social History   Socioeconomic History   Marital status: Married    Spouse name: Not on file   Number of children: Not on file   Years of education: Not on file   Highest education level: Not on file  Occupational History   Not on file  Social Needs   Financial resource strain: Not on file   Food insecurity:    Worry: Not on file    Inability: Not on file   Transportation needs:    Medical: Not on file    Non-medical: Not on file  Tobacco Use   Smoking status: Current Every Day Smoker    Packs/day: 0.30    Years: 20.00    Pack years: 6.00    Types: Cigarettes   Smokeless tobacco: Never Used  Substance and Sexual Activity   Alcohol use: Yes    Frequency: Never    Comment: 2-3 beers/day or wine   Drug use: No   Sexual activity: Never  Lifestyle   Physical activity:    Days per week: Not on file    Minutes per session: Not on file   Stress: Not on file  Relationships   Social connections:    Talks on phone: Not on file    Gets together: Not on file    Attends religious service: Not on file    Active member of club or organization: Not on file    Attends meetings of clubs or organizations: Not on file    Relationship status: Not on file   Intimate partner violence:    Fear of current or ex partner: Not on file    Emotionally abused: Not on file    Physically abused: Not on file    Forced sexual activity: Not on file  Other Topics Concern   Not on file  Social History Narrative   Not on file    Family History  Problem Relation Age of Onset   Heart attack Mother    Heart attack Father    Allergies  Allergen Reactions   Azithromycin Other (See Comments)    Other reaction(s): Unknown    Morphine And Related    Neomycin    Other     METHIOLATE   Penicillins     Did it involve swelling of the face/tongue/throat,  SOB, or low BP? Yes Did it involve sudden or severe rash/hives, skin peeling, or any reaction on the inside of your mouth or nose? Yes Did you need to seek medical attention at a hospital or doctor's office? Yes When did it last happen? 10 years If all above answers are NO,  may proceed with cephalosporin use.   ? Current Facility-Administered Medications  Medication Dose Route Frequency Provider Last Rate Last Dose   0.9 % NaCl with KCl 20 mEq/ L  infusion   Intravenous Continuous Henreitta Leber, MD 75 mL/hr at 03/31/19 1500     acetaminophen (TYLENOL) tablet 650 mg  650 mg Oral Q6H PRN Henreitta Leber, MD       Or   acetaminophen (TYLENOL) suppository 650 mg  650 mg Rectal Q6H PRN Henreitta Leber, MD       budesonide (PULMICORT) nebulizer solution 0.5 mg  0.5 mg Nebulization BID Henreitta Leber, MD   0.5 mg at 03/31/19 0731   carvedilol (COREG) tablet 6.25 mg  6.25 mg Oral BID WC Vaughan Basta, MD       cefTRIAXone (ROCEPHIN) 2 g in sodium chloride 0.9 % 100 mL IVPB  2 g Intravenous Q24H Vaughan Basta, MD       folic acid (FOLVITE) tablet 1 mg  1 mg Oral Daily Vaughan Basta, MD       hydrochlorothiazide (HYDRODIURIL) tablet 25 mg  25 mg Oral Daily Vaughan Basta, MD       ipratropium-albuterol (DUONEB) 0.5-2.5 (3) MG/3ML nebulizer solution 3 mL  3 mL Nebulization TID Vaughan Basta, MD   3 mL at 03/31/19 1459   ipratropium-albuterol (DUONEB) 0.5-2.5 (3) MG/3ML nebulizer solution 3 mL  3 mL Nebulization Q6H PRN Vaughan Basta, MD       losartan (COZAAR) tablet 25 mg  25 mg Oral Daily Vaughan Basta, MD       ondansetron (ZOFRAN) tablet 4 mg  4 mg Oral Q6H PRN Henreitta Leber, MD       Or   ondansetron (ZOFRAN) injection 4 mg  4 mg Intravenous Q6H PRN Sainani, Belia Heman, MD       potassium chloride SA (K-DUR) CR tablet 40 mEq  40 mEq Oral Once Vaughan Basta, MD       sertraline (ZOLOFT) tablet 200 mg  200  mg Oral QHS Vaughan Basta, MD       simvastatin (ZOCOR) tablet 40 mg  40 mg Oral q1800 Vaughan Basta, MD         Abtx:  Anti-infectives (From admission, onward)   Start     Dose/Rate Route Frequency Ordered Stop   03/31/19 1800  levofloxacin (LEVAQUIN) IVPB 750 mg  Status:  Discontinued     750 mg 100 mL/hr over 90 Minutes Intravenous Every 48 hours 03/29/19 1806 03/30/19 1419   03/31/19 1200  cefTRIAXone (ROCEPHIN) 2 g in sodium chloride 0.9 % 100 mL IVPB     2 g 200 mL/hr over 30 Minutes Intravenous Every 24 hours 03/31/19 0927     03/31/19 1000  cefTRIAXone (ROCEPHIN) 1 g in sodium chloride 0.9 % 100 mL IVPB  Status:  Discontinued     1 g 200 mL/hr over 30 Minutes Intravenous Every 12 hours 03/31/19 0910 03/31/19 0927   03/30/19 1800  levofloxacin (LEVAQUIN) IVPB 750 mg  Status:  Discontinued     750 mg 100 mL/hr over 90 Minutes Intravenous Every 24 hours 03/30/19 1419 03/30/19 1518   03/30/19 1800  ceFEPIme (MAXIPIME) 2 g in sodium chloride 0.9 % 100 mL IVPB  Status:  Discontinued     2 g 200 mL/hr over 30 Minutes Intravenous Every 12 hours 03/30/19 1519 03/31/19 0842   03/30/19 1600  metroNIDAZOLE (FLAGYL) IVPB 500 mg  Status:  Discontinued     500  mg 100 mL/hr over 60 Minutes Intravenous Every 8 hours 03/30/19 1518 03/31/19 0842   03/30/19 0800  vancomycin (VANCOCIN) 1,250 mg in sodium chloride 0.9 % 250 mL IVPB  Status:  Discontinued     1,250 mg 166.7 mL/hr over 90 Minutes Intravenous Every 24 hours 03/29/19 1806 03/29/19 1838   03/30/19 0800  vancomycin (VANCOCIN) 1,250 mg in sodium chloride 0.9 % 250 mL IVPB  Status:  Discontinued     1,250 mg 166.7 mL/hr over 90 Minutes Intravenous Every 24 hours 03/29/19 1839 03/31/19 0842   03/29/19 2200  aztreonam (AZACTAM) injection 1 g  Status:  Discontinued     1 g Intramuscular Every 8 hours 03/29/19 1806 03/29/19 1806   03/29/19 2200  aztreonam (AZACTAM) 1 g in sodium chloride 0.9 % 100 mL IVPB  Status:   Discontinued     1 g 200 mL/hr over 30 Minutes Intravenous Every 8 hours 03/29/19 1808 03/30/19 1518   03/29/19 1545  vancomycin (VANCOCIN) IVPB 1000 mg/200 mL premix     1,000 mg 200 mL/hr over 60 Minutes Intravenous  Once 03/29/19 1539 03/29/19 1727   03/29/19 1545  levofloxacin (LEVAQUIN) IVPB 750 mg     750 mg 100 mL/hr over 90 Minutes Intravenous  Once 03/29/19 1539 03/29/19 1824   03/29/19 1545  aztreonam (AZACTAM) injection 1 g  Status:  Discontinued     1 g Intramuscular Every 8 hours 03/29/19 1539 03/29/19 2143      REVIEW OF SYSTEMS: Patient is confused and is unable to get a reliable review of system. Const:  fever, negative chills, negative weight loss Eyes: negative diplopia or visual changes, negative eye pain ENT: negative coryza, negative sore throat Resp:  cough, no hemoptysis, has dyspnea Cards: negative for chest pain, palpitations, lower extremity edema GU: negative for frequency, dysuria and hematuria GI: Negative for abdominal pain, diarrhea, bleeding, constipation Skin: negative for rash and pruritus Heme: negative for easy bruising and gum/nose bleeding MS: Complains of generalized weakness Neurolo:negative for headaches, dizziness, vertigo, memory problems  Psych: negative for feelings of anxiety, depression  Endocrine: No polyuria or polydipsia  allergy/Immunology-states he had allergy for penicillin and it caused choking: Objective:  VITALS:  BP 113/76 (BP Location: Left Arm)    Pulse (!) 102    Temp 98.2 F (36.8 C) (Oral)    Resp 17    Ht '5\' 10"'  (1.778 m)    Wt 77.1 kg    SpO2 97%    BMI 24.39 kg/m  PHYSICAL EXAM:  General: Awake alert, oriented to place person year and month.  Some confusion at times.  , no distress, appears stated age.  Head: Surgical scar on the left side  eyes: Conjunctivae clear, anicteric sclerae. Pupils are equal ENT Nares normal. No drainage or sinus tenderness. Lips, mucosa, and tongue normal. No Thrush Neck: Supple,  . Back: No CVA tenderness. Lungs: Bilateral air entry Heart: S1 and S2 Abdomen: Soft, non-tender,not distended. Bowel sounds normal. No masses Extremities: Right foot Great toenail has been avulsed and the nailbed is visible with red  Tissue.  Congealed blood present on the edges He is got hammertoes and on top of the toes there is some pressure wounds   Scar over the ankle Skin: Limited examination Lymph: Cervical, supraclavicular normal. Neurologic: Grossly non-focal Pertinent Labs Lab Results CBC    Component Value Date/Time   WBC 3.5 (L) 03/31/2019 0523   RBC 2.87 (L) 03/31/2019 0523   HGB 10.1 (L) 03/31/2019 0973  HCT 29.1 (L) 03/31/2019 0523   PLT 37 (L) 03/31/2019 0523   MCV 101.4 (H) 03/31/2019 0523   MCH 35.2 (H) 03/31/2019 0523   MCHC 34.7 03/31/2019 0523   RDW 12.9 03/31/2019 0523   LYMPHSABS 0.9 03/29/2019 1541   MONOABS 0.6 03/29/2019 1541   EOSABS 0.0 03/29/2019 1541   BASOSABS 0.0 03/29/2019 1541    CMP Latest Ref Rng & Units 03/31/2019 03/30/2019 03/29/2019  Glucose 70 - 99 mg/dL 103(H) 172(H) 126(H)  BUN 8 - 23 mg/dL 23 46(H) 57(H)  Creatinine 0.61 - 1.24 mg/dL 0.62 1.19 1.36(H)  Sodium 135 - 145 mmol/L 136 134(L) 136  Potassium 3.5 - 5.1 mmol/L 3.3(L) 3.4(L) 3.1(L)  Chloride 98 - 111 mmol/L 104 97(L) 96(L)  CO2 22 - 32 mmol/L 25 22 18(L)  Calcium 8.9 - 10.3 mg/dL 7.3(L) 7.7(L) 8.3(L)  Total Protein 6.5 - 8.1 g/dL 5.0(L) 5.6(L) -  Total Bilirubin 0.3 - 1.2 mg/dL 1.1 1.7(H) -  Alkaline Phos 38 - 126 U/L 162(H) 186(H) -  AST 15 - 41 U/L 133(H) 140(H) -  ALT 0 - 44 U/L 54(H) 67(H) -      Microbiology: Recent Results (from the past 240 hour(s))  SARS Coronavirus 2 (CEPHEID- Performed in [facility] hospital lab), Hosp Order     Status: None   Collection Time: 03/29/19  3:41 PM  Result Value Ref Range Status   SARS Coronavirus 2 NEGATIVE NEGATIVE Final    Comment: (NOTE) If result is NEGATIVE SARS-CoV-2 target nucleic acids are NOT DETECTED. The  SARS-CoV-2 RNA is generally detectable in upper and lower  respiratory specimens during the acute phase of infection. The lowest  concentration of SARS-CoV-2 viral copies this assay can detect is 250  copies / mL. A negative result does not preclude SARS-CoV-2 infection  and should not be used as the sole basis for treatment or other  patient management decisions.  A negative result may occur with  improper specimen collection / handling, submission of specimen other  than nasopharyngeal swab, presence of viral mutation(s) within the  areas targeted by this assay, and inadequate number of viral copies  (<250 copies / mL). A negative result must be combined with clinical  observations, patient history, and epidemiological information. If result is POSITIVE SARS-CoV-2 target nucleic acids are DETECTED. The SARS-CoV-2 RNA is generally detectable in upper and lower  respiratory specimens dur ing the acute phase of infection.  Positive  results are indicative of active infection with SARS-CoV-2.  Clinical  correlation with patient history and other diagnostic information is  necessary to determine patient infection status.  Positive results do  not rule out bacterial infection or co-infection with other viruses. If result is PRESUMPTIVE POSTIVE SARS-CoV-2 nucleic acids MAY BE PRESENT.   A presumptive positive result was obtained on the submitted specimen  and confirmed on repeat testing.  While 2019 novel coronavirus  (SARS-CoV-2) nucleic acids may be present in the submitted sample  additional confirmatory testing may be necessary for epidemiological  and / or clinical management purposes  to differentiate between  SARS-CoV-2 and other Sarbecovirus currently known to infect humans.  If clinically indicated additional testing with an alternate test  methodology [id number]) is advised. The SARS-CoV-2 RNA is generally  detectable in upper and lower respiratory sp ecimens during the acute    phase of infection. The expected result is Negative. Fact Sheet for Patients:  StrictlyIdeas.no Fact Sheet for Healthcare Providers: BankingDealers.co.za This test is not yet approved or cleared  by the [place] and has been authorized for detection and/or diagnosis of SARS-CoV-2 by FDA under an Emergency Use Authorization (EUA).  This EUA will remain in effect (meaning this test can be used) for the duration of the COVID-19 declaration under Section 564(b)(1) of the Act, 21 U.S.C. section 360bbb-3(b)(1), unless the authorization is terminated or revoked sooner. Performed at [facility], [address] [place], [place] [address]   Blood culture (routine x 2)     Status: Abnormal (Preliminary result)   Collection Time: 03/29/19  3:52 PM  Result Value Ref Range Status   Specimen Description   Final    BLOOD LEFT ANTECUBITAL Performed at [facility], [address] [place], [place] [address]    Special Requests   Final    BOTTLES DRAWN AEROBIC AND ANAEROBIC Blood Culture results may not be optimal due to an excessive volume of blood received in culture bottles Performed at [facility], [address] [place], [place] [address]    Culture  Setup Time   Final    Organism ID to follow [place] TO, READ BACK BY AND VERIFIED WITH: S MARTIN 03/30/2019 AT 22 BY [facility] Performed at [facility], [address] [place], [place] [address]    Culture (A)  Final    HAEMOPHILUS INFLUENZAE BETA LACTAMASE POSITIVE Performed at [facility], [place] [address] [place], [place] [address]    Report Status PENDING  Incomplete  Blood culture (routine x 2)     Status: None (Preliminary result)   Collection Time: 03/29/19  3:52 PM  Result Value Ref Range Status   Specimen Description BLOOD BLOOD LEFT WRIST  Final   Special Requests    Final    BOTTLES DRAWN AEROBIC AND ANAEROBIC Blood Culture results may not be optimal due to an excessive volume of blood received in culture bottles   Culture   Final    NO GROWTH 2 DAYS Performed at [facility], [address] [place],  [address]    Report Status PENDING  Incomplete  Blood Culture ID Panel (Reflexed)     Status: Abnormal   Collection Time: 03/29/19  3:52 PM  Result Value Ref Range Status   Enterococcus species NOT DETECTED NOT DETECTED Final   Listeria monocytogenes NOT DETECTED NOT DETECTED Final   Staphylococcus species NOT DETECTED NOT DETECTED Final   Staphylococcus aureus (BCID) NOT DETECTED NOT DETECTED Final   Streptococcus species NOT DETECTED NOT DETECTED Final   Streptococcus agalactiae NOT DETECTED NOT DETECTED Final   Streptococcus pneumoniae NOT DETECTED NOT DETECTED Final   Streptococcus pyogenes NOT DETECTED NOT DETECTED Final   Acinetobacter baumannii NOT DETECTED NOT DETECTED Final   Enterobacteriaceae species NOT DETECTED NOT DETECTED Final   Enterobacter cloacae complex NOT DETECTED NOT DETECTED Final   Escherichia coli NOT DETECTED NOT DETECTED Final   Klebsiella oxytoca NOT DETECTED NOT DETECTED Final   Klebsiella pneumoniae NOT DETECTED NOT DETECTED Final   Proteus species NOT DETECTED NOT DETECTED Final   Serratia marcescens NOT DETECTED NOT DETECTED Final   Haemophilus influenzae DETECTED (A) NOT DETECTED Final    Comment: CRITICAL RESULT CALLED TO, READ BACK BY AND VERIFIED WITH: S MARTIN 03/30/2019 AT 1515 BY H SOEWARDIMAN    Neisseria meningitidis NOT DETECTED NOT DETECTED Final   Pseudomonas aeruginosa NOT DETECTED NOT DETECTED Final   Candida albicans NOT DETECTED NOT DETECTED Final  Candida glabrata NOT DETECTED NOT DETECTED Final   Candida krusei NOT DETECTED NOT DETECTED Final   Candida parapsilosis NOT DETECTED NOT DETECTED Final   Candida tropicalis NOT DETECTED NOT DETECTED Final    Comment: Performed at  [facility], [address] [place], [place] [address]    IMAGING RESULTS:   I have personally reviewed the films ? Impression/Recommendation ?78 year old male with history of alcohol abuse, hypertension presents with altered mental status, cough and shortness of breath  Haemophilus influenza bacteremia: Likely source is respiratory tract.  There is no obvious pneumonia on chest x-ray.  We will give him ceftriaxone IV.  He will need at least 10 days of antibiotics .  Initially IV and we may be able to transition to p.o. quinolone when ready for discharge..   Avulsion of the right great toenail.  No obvious infection.  Encephalopathy: Could be related to the infection versus alcohol.  Watch for DTs.  Supplement thiamine folate  AKI has resolved  Hypertension on ACE inhibitor, hydrochlorothiazide and carvedilol  Hypokalemia could be related to alcohol but he is also on a diuretic.  Macrocytic anemia  Thrombocytopenia chronic.  Could be related to alcohol.  We will check for HIV need to check B12 and folate  There is a mention of multiple myeloma in the records.  Will talk to his wife.  Transaminitis.  Could be related to alcohol, may have underlying cirrhosis. ?Discussed the management with his nurse ? ___________________________________________________ Discussed with patient, requesting provider Note:  This document was prepared using Dragon voice recognition software and may include unintentional dictation errors.

## 2019-03-31 NOTE — Progress Notes (Signed)
[facility] [place] NAME: Ryan Travis    MR#:  [id number]  DATE OF BIRTH:  [birth year]  SUBJECTIVE:  CHIEF COMPLAINT:   Chief Complaint  Patient presents with  . Shortness of Breath   came with drowsiness. Noted missing toenail on right great toe Podiatry say does not look infected.  Patient is more alert and oriented today and could carry out reliable conversation.  REVIEW OF SYSTEMS:    Review of Systems  Constitutional: Negative for fever, malaise/fatigue and weight loss.  HENT: Negative for congestion, ear discharge, hearing loss and tinnitus.   Eyes: Negative for blurred vision and double vision.  Respiratory: Negative for cough, sputum production and shortness of breath.   Cardiovascular: Negative for chest pain and palpitations.  Gastrointestinal: Negative for abdominal pain, constipation, nausea and vomiting.  Genitourinary: Negative for dysuria, frequency and urgency.  Musculoskeletal: Negative for back pain and myalgias.  Skin: Negative for rash.  Neurological: Positive for weakness. Negative for sensory change, speech change and headaches.  Psychiatric/Behavioral: Negative for depression and suicidal ideas.    DRUG ALLERGIES:   Allergies  Allergen Reactions  . Azithromycin Other (See Comments)    Other reaction(s): Unknown   . Morphine And Related   . Neomycin   . Other     METHIOLATE  . Penicillins     Did it involve swelling of the face/tongue/throat, SOB, or low BP? Yes Did it involve sudden or severe rash/hives, skin peeling, or any reaction on the inside of your mouth or nose? Yes Did you need to seek medical attention at a hospital or doctor's office? Yes When did it last happen? 10 years If all above answers are "NO", may proceed with cephalosporin use.    VITALS:  Blood pressure 113/76, pulse (!) 102, temperature 98.2 F (36.8 C), temperature source Oral, resp. rate 17, height '5\' 10"'  (1.778 m),  weight 77.1 kg, SpO2 97 %.  PHYSICAL EXAMINATION:   GENERAL:  78 y.o.-year-old unkempt patient lying in the bed in no acute distress.  EYES: Pupils equal, round, reactive to light and accommodation. No scleral icterus. Extraocular muscles intact.  HEENT: Head atraumatic, normocephalic. Oropharynx and nasopharynx clear. No oropharyngeal erythema, moist oral mucosa  NECK:  Supple, no jugular venous distention. No thyroid enlargement, no tenderness.  LUNGS: Normal breath sounds bilaterally, no wheezing, rales, rhonchi. No use of accessory muscles of respiration.  CARDIOVASCULAR: S1, S2 RRR. No murmurs, rubs, gallops, clicks.  ABDOMEN: Soft, nontender, nondistended. Bowel sounds present. No organomegaly or mass.  EXTREMITIES: No pedal edema, cyanosis, or clubbing. + 2 pedal & radial pulses b/l.   NEUROLOGIC: Cranial nerves II through XII are intact. No focal Motor or sensory deficits appreciated b/l, both lower limbs appear weak with deformity in left knee PSYCHIATRIC: The patient is alert and oriented today.  SKIN: No obvious rash, lesion, or ulcer. Right Great toe missing nail and raw skin.  Physical Exam LABORATORY PANEL:   CBC Recent Labs  Lab 03/31/19 0523  WBC 3.5*  HGB 10.1*  HCT 29.1*  PLT 37*   ------------------------------------------------------------------------------------------------------------------  Chemistries  Recent Labs  Lab 03/29/19 1541  03/31/19 0523  NA 136   < > 136  K 3.1*   < > 3.3*  CL 96*   < > 104  CO2 18*   < > 25  GLUCOSE 126*   < > 103*  BUN 57*   < > 23  CREATININE 1.36*   < >  0.62  CALCIUM 8.3*   < > 7.3*  MG 1.7  --   --   AST  --    < > 133*  ALT  --    < > 54*  ALKPHOS  --    < > 162*  BILITOT  --    < > 1.1   < > = values in this interval not displayed.   ------------------------------------------------------------------------------------------------------------------  Cardiac Enzymes Recent Labs  Lab 03/29/19 1541  03/31/19 0523  TROPONINI 0.17* 0.04*   ------------------------------------------------------------------------------------------------------------------  RADIOLOGY:  Ct Head Wo Contrast  Result Date: 03/30/2019 CLINICAL DATA:  Cough, shortness of breath, altered mental status EXAM: CT HEAD WITHOUT CONTRAST TECHNIQUE: Contiguous axial images were obtained from the base of the skull through the vertex without intravenous contrast. COMPARISON:  01/09/2019 FINDINGS: Brain: No evidence of acute infarction, hemorrhage, hydrocephalus, extra-axial collection or mass lesion/mass effect. Periventricular white matter hypodensity and global volume loss. Nonacute right cerebellar infarction. Vascular: No hyperdense vessel or unexpected calcification. Skull: Evidence of prior left frontal and parietal burr hole craniotomies. Negative for fracture or focal lesion. Sinuses/Orbits: No acute finding. Other: None. IMPRESSION: 1. No acute intracranial pathology. 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 2. Evidence of prior left frontal and parietal burr hole craniotomies. 3.  Nonacute right cerebellar infarction. Electronically Signed   By: Eddie Candle M.D.   On: 03/30/2019 17:13   Mr Foot Right Wo Contrast  Result Date: 03/29/2019 CLINICAL DATA:  Foot swelling, diabetic ulcer EXAM: MRI OF THE RIGHT FOREFOOT WITHOUT CONTRAST TECHNIQUE: Multiplanar, multisequence MR imaging of the right forefoot was performed. No intravenous contrast was administered. COMPARISON:  None. FINDINGS: Severe patient motion degrading image quality limiting evaluation. Bones/Joint/Cartilage Soft tissue wound along the medial aspect of mid first metatarsal. Mild marrow edema in the mid first metatarsal with irregularity along the medial cortex concerning for mild osteomyelitis. Soft tissue wound at the tip of the first distal phalanx. No osteomyelitis of the first distal phalanx, but evaluation is limited secondary to significant  patient motion. No other marrow signal abnormality. No fracture or dislocation. Normal alignment. No joint effusion. Mild osteoarthritis of the first, fourth and fifth tarsometatarsal joints. Moderate osteoarthritis of the second and third tarsometatarsal joints. Ligaments Collateral ligaments are intact. Lisfranc ligament is intact. Lisfranc ligament is intact. Muscles and Tendons Flexor, peroneal and extensor compartment tendons are intact. Muscles are normal. Soft tissue No fluid collection or hematoma.  No soft tissue mass. IMPRESSION: 1. Soft tissue wound along the medial aspect of mid first metatarsal. Mild marrow edema in the mid first metatarsal with irregularity along the medial cortex concerning for mild osteomyelitis versus reactive changes secondary to adjacent inflammation. 2. Soft tissue wound at the tip of the first distal phalanx. No osteomyelitis of the first distal phalanx, but evaluation is limited secondary to significant patient motion. Electronically Signed   By: Kathreen Devoid   On: 03/29/2019 21:35   Dg Chest Port 1 View  Result Date: 03/29/2019 CLINICAL DATA:  Shortness of breath and cough EXAM: PORTABLE CHEST 1 VIEW COMPARISON:  January 08, 2019 FINDINGS: There is no edema or consolidation. Heart size and pulmonary vascularity are normal. No adenopathy. There is aortic atherosclerosis. Bones are osteoporotic. There is degenerative change in each shoulder with superior migration of the right and left humeral heads. There are surgical clips in the left axillary region. IMPRESSION: No edema or consolidation. Heart size normal. Aortic Atherosclerosis (ICD10-I70.0). Bones osteoporotic. Superior migration of each humeral head  is indicative of chronic rotator cuff tears. Electronically Signed   By: Lowella Grip III M.D.   On: 03/29/2019 15:59   Dg Foot 2 Views Right  Result Date: 03/29/2019 CLINICAL DATA:  Open wound of the foot. Wound is located along the great toe. EXAM: RIGHT FOOT - 2  VIEW COMPARISON:  None. FINDINGS: Severe osteopenia. No acute fracture or dislocation. Prior tibiotalar and subtalar arthrodesis. Lucency around the distal intramedullary nail and interlocking screw consistent with loosening versus infection. Moderate osteoarthritis of first tarsometatarsal joint. Moderate osteoarthritis of fifth MTP joint. Mild osteoarthritis of the second, third and fourth TMT joints. Mild osteoarthritis of the third MTP joint. No periosteal reaction or bone destruction. IMPRESSION: 1. No radiographic evidence of osteomyelitis of great toe. 2. Osteoarthritis of the right foot as described above. 3. Prior tibiotalar and subtalar arthrodesis. Lucency around the distal intramedullary nail and interlocking screw consistent with loosening versus infection. Electronically Signed   By: Kathreen Devoid   On: 03/29/2019 16:01    ASSESSMENT AND PLAN:   Active Problems:   Sepsis ([place])  78 y.o. male with a known history of alcohol abuse, anxiety, history of multiple myeloma, depression, neuropathy, essential hypertension, restless leg syndrome who presents from home due to cough, shortness of breath.  1.  Sepsis, bacteremia - patient meets criteria due to patient's elevated lactic acid, tachycardia initially suspected to have right toe infection, MRI was questionable for osteomyelitis I have called consult podiatry and after examining they suggested it is just missing toe but does not look infected and advised to stop antibiotics -Follow cultures. -Blood culture have haemophilus influenzae Called infectious disease consult.  2.  Right great toe ulcer    As per podiatry, does not look infected and does not need further antibiotics for this.  3.  Acute kidney injury- secondary to alcohol abuse and dehydration. - We will gently hydrate the patient with IV fluids, follow BUN/creatinine urine output. -Renal dose meds, avoid nephrotoxins.  Renal function slightly better.  4.  Elevated  troponin-likely in the setting of supply demand ischemia from acute kidney injury and underlying sepsis. -Stable cardiac markers.  Observe on telemetry.  Checked echocardiogram.  No significant abnormalities.  5.  Thrombocytopenia-secondary to alcohol abuse. -Follow platelet count.  No acute bleeding presently. Avoid anticoagulation.  6.  History of coronary disease status post PCI and stent placement- patient has a mildly elevated troponin which is likely secondary to demand ischemia. - We will continue his Plavix, statin, carvedilol.  7.  Depression-continue Zoloft.  I will decrease the dose as patient had significant altered mental status.  8.  Neuropathy- hold gabapentin due to altered mental status.  9.  Altered mental status CT head is negative for stroke and ammonia is not elevated. As per wife patient was drowsy for last 1 week.   This could be combination of infection and drug use like gabapentin and sertraline I will stop gabapentin.  Patient is much improved today.  Patient's daughter was very concerned as patient and his wife both are wheelchair-bound and they does not take care of properly, there is an open case with Department of Social Security for further care of patient.  I have informed social worker regarding this and further management as per them.  All the records are reviewed and case discussed with Care Management/Social Workerr. Management plans discussed with the patient, family and they are in agreement.  CODE STATUS: Full code.  TOTAL TIME TAKING CARE OF THIS PATIENT: 63  minutes.   I spoke to patient's wife on the phone.  POSSIBLE D/C IN 1-2 DAYS, DEPENDING ON CLINICAL CONDITION.   Vaughan Basta M.D on 03/31/2019   Between 7am to 6pm - Pager - [phone]  After 6pm go to [email or web address] - password [email or web address] [facility]  Office  [phone]  CC: Primary care physician; [place]  Note: This dictation  was prepared with Dragon dictation along with smaller phrase technology. Any transcriptional errors that result from this process are unintentional.

## 2019-04-01 LAB — CBC
HCT: 31.8 % — ABNORMAL LOW (ref 39.0–52.0)
Hemoglobin: 11.1 g/dL — ABNORMAL LOW (ref 13.0–17.0)
MCH: 35.1 pg — ABNORMAL HIGH (ref 26.0–34.0)
MCHC: 34.9 g/dL (ref 30.0–36.0)
MCV: 100.6 fL — ABNORMAL HIGH (ref 80.0–100.0)
Platelets: 77 10*3/uL — ABNORMAL LOW (ref 150–400)
RBC: 3.16 MIL/uL — ABNORMAL LOW (ref 4.22–5.81)
RDW: 12.8 % (ref 11.5–15.5)
WBC: 6.4 10*3/uL (ref 4.0–10.5)
nRBC: 0 % (ref 0.0–0.2)

## 2019-04-01 LAB — COMPREHENSIVE METABOLIC PANEL
ALT: 55 U/L — ABNORMAL HIGH (ref 0–44)
AST: 136 U/L — ABNORMAL HIGH (ref 15–41)
Albumin: 2.5 g/dL — ABNORMAL LOW (ref 3.5–5.0)
Alkaline Phosphatase: 175 U/L — ABNORMAL HIGH (ref 38–126)
Anion gap: 10 (ref 5–15)
BUN: 13 mg/dL (ref 8–23)
CO2: 24 mmol/L (ref 22–32)
Calcium: 7.3 mg/dL — ABNORMAL LOW (ref 8.9–10.3)
Chloride: 102 mmol/L (ref 98–111)
Creatinine, Ser: 0.65 mg/dL (ref 0.61–1.24)
GFR calc Af Amer: >60 mL/min (ref >60)
GFR calc non Af Amer: >60 mL/min (ref >60)
Glucose, Bld: 110 mg/dL — ABNORMAL HIGH (ref 70–99)
Potassium: 3.4 mmol/L — ABNORMAL LOW (ref 3.5–5.1)
Sodium: 136 mmol/L (ref 135–145)
Total Bilirubin: 1.3 mg/dL — ABNORMAL HIGH (ref 0.3–1.2)
Total Protein: 5.7 g/dL — ABNORMAL LOW (ref 6.5–8.1)

## 2019-04-01 LAB — URINE CULTURE: Culture: NO GROWTH

## 2019-04-01 LAB — VITAMIN B12: Vitamin B-12: 360 pg/mL (ref 180–914)

## 2019-04-01 LAB — FOLATE: Folate: 8.9 ng/mL (ref >5.9)

## 2019-04-01 MED ORDER — LORAZEPAM 2 MG/ML IJ SOLN
1.0000 mg | Freq: Once | INTRAMUSCULAR | Status: AC
  Administered 2019-04-01: 05:00:00 1 mg via INTRAVENOUS
  Filled 2019-04-01: qty 1

## 2019-04-01 MED ORDER — DEXTROSE-NACL 5-0.45 % IV SOLN
INTRAVENOUS | Status: DC
  Administered 2019-04-01: 13:00:00 via INTRAVENOUS

## 2019-04-01 MED ORDER — HALOPERIDOL LACTATE 5 MG/ML IJ SOLN: 5.0000 mg | Freq: Four times a day (QID) | INTRAMUSCULAR | Status: DC | PRN

## 2019-04-01 MED ORDER — POTASSIUM CHLORIDE 20 MEQ PO PACK
40.0000 meq | PACK | Freq: Once | ORAL | Status: AC
  Administered 2019-04-01: 40 meq via ORAL
  Filled 2019-04-01: qty 2

## 2019-04-01 MED ORDER — HYDRALAZINE HCL 20 MG/ML IJ SOLN
2.0000 mg | Freq: Once | INTRAMUSCULAR | Status: AC
  Administered 2019-04-01: 05:00:00 2 mg via INTRAVENOUS
  Filled 2019-04-01: qty 1

## 2019-04-01 NOTE — Progress Notes (Addendum)
[facility] [place] [facility]   PATIENT NAME: Ryan Travis    MR#:  [id number]  DATE OF BIRTH:  02/26/1941  SUBJECTIVE:  Patient without complaint, patient seems appropriate, able to answer questions mild slow mentation noted, infectious disease input appreciated, podiatry input appreciated, follow-up on urine cultures, noted dehydration-start IV fluids for 16 hours, replete potassium  REVIEW OF SYSTEMS:    Review of Systems  Constitutional: Negative for fever, malaise/fatigue and weight loss.  HENT: Negative for congestion, ear discharge, hearing loss and tinnitus.   Eyes: Negative for blurred vision and double vision.  Respiratory: Negative for cough, sputum production and shortness of breath.   Cardiovascular: Negative for chest pain and palpitations.  Gastrointestinal: Negative for abdominal pain, constipation, nausea and vomiting.  Genitourinary: Negative for dysuria, frequency and urgency.  Musculoskeletal: Negative for back pain and myalgias.  Skin: Negative for rash.  Neurological: Positive for weakness. Negative for sensory change, speech change and headaches.  Psychiatric/Behavioral: Negative for depression and suicidal ideas.    DRUG ALLERGIES:   Allergies  Allergen Reactions  . Azithromycin Other (See Comments)    Other reaction(s): Unknown   . Morphine And Related   . Neomycin   . Other     METHIOLATE  . Penicillins     Did it involve swelling of the face/tongue/throat, SOB, or low BP? Yes Did it involve sudden or severe rash/hives, skin peeling, or any reaction on the inside of your mouth or nose? Yes Did you need to seek medical attention at a hospital or doctor's office? Yes When did it last happen? 10 years If all above answers are "NO", may proceed with cephalosporin use.    VITALS:  Blood pressure 107/82, pulse (!) 105, temperature 97.7 F (36.5 C), temperature source Oral, resp. rate (!) 21, height 5' 10" (1.778 m), weight  77.1 kg, SpO2 94 %.  PHYSICAL EXAMINATION:   GENERAL:  78 y.o.-year-old unkempt patient lying in the bed in no acute distress.  EYES: Pupils equal, round, reactive to light and accommodation. No scleral icterus. Extraocular muscles intact.  HEENT: Head atraumatic, normocephalic. Oropharynx and nasopharynx clear. No oropharyngeal erythema, moist oral mucosa  NECK:  Supple, no jugular venous distention. No thyroid enlargement, no tenderness.  LUNGS: Normal breath sounds bilaterally, no wheezing, rales, rhonchi. No use of accessory muscles of respiration.  CARDIOVASCULAR: S1, S2 RRR. No murmurs, rubs, gallops, clicks.  ABDOMEN: Soft, nontender, nondistended. Bowel sounds present. No organomegaly or mass.  EXTREMITIES: No pedal edema, cyanosis, or clubbing. + 2 pedal & radial pulses b/l.   NEUROLOGIC: Cranial nerves II through XII are intact. No focal Motor or sensory deficits appreciated b/l, both lower limbs appear weak with deformity in left knee PSYCHIATRIC: The patient is alert and oriented today.  SKIN: No obvious rash, lesion, or ulcer. Right Great toe missing nail and raw skin.  Physical Exam LABORATORY PANEL:   CBC Recent Labs  Lab 04/01/19 0501  WBC 6.4  HGB 11.1*  HCT 31.8*  PLT 77*   ------------------------------------------------------------------------------------------------------------------  Chemistries  Recent Labs  Lab 03/29/19 1541  04/01/19 0501  NA 136   < > 136  K 3.1*   < > 3.4*  CL 96*   < > 102  CO2 18*   < > 24  GLUCOSE 126*   < > 110*  BUN 57*   < > 13  CREATININE 1.36*   < > 0.65  CALCIUM 8.3*   < > 7.3*  MG   1.7  --   --   AST  --    < > 136*  ALT  --    < > 55*  ALKPHOS  --    < > 175*  BILITOT  --    < > 1.3*   < > = values in this interval not displayed.   ------------------------------------------------------------------------------------------------------------------  Cardiac Enzymes Recent Labs  Lab 03/29/19 1541 03/31/19 0523   TROPONINI 0.17* 0.04*   ------------------------------------------------------------------------------------------------------------------  RADIOLOGY:  Ct Head Wo Contrast  Result Date: 03/30/2019 CLINICAL DATA:  Cough, shortness of breath, altered mental status EXAM: CT HEAD WITHOUT CONTRAST TECHNIQUE: Contiguous axial images were obtained from the base of the skull through the vertex without intravenous contrast. COMPARISON:  01/09/2019 FINDINGS: Brain: No evidence of acute infarction, hemorrhage, hydrocephalus, extra-axial collection or mass lesion/mass effect. Periventricular white matter hypodensity and global volume loss. Nonacute right cerebellar infarction. Vascular: No hyperdense vessel or unexpected calcification. Skull: Evidence of prior left frontal and parietal burr hole craniotomies. Negative for fracture or focal lesion. Sinuses/Orbits: No acute finding. Other: None. IMPRESSION: 1. No acute intracranial pathology. 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 2. Evidence of prior left frontal and parietal burr hole craniotomies. 3.  Nonacute right cerebellar infarction. Electronically Signed   By: Eddie Candle M.D.   On: 03/30/2019 17:13    ASSESSMENT AND PLAN:  78 y.o. male with a known history of alcohol abuse, anxiety, history of multiple myeloma, depression, neuropathy, essential hypertension, restless leg syndrome who presents from home due to cough, shortness of breath.  *Sepsis Secondary to Haemophilus influenza bacteremia Resolved Treated on our sepsis protocol, infectious disease input appreciated-recommended Rocephin for now for 10-day course/may convert to quinolone upon discharge, follow-up on outstanding urine culture, repeat blood cultures negative thus far  *Acute Haemophilus influenza bacteremia Resolving Plan of care as stated above  *Acute right great toenail avulsion Stable As per podiatry/Dr. Toney Rakes antibiotics recommended, infectious  disease input appreciated Conservative medical management  *AKI Resolved with IV fluids for rehydration  secondary to alcohol abuse and dehydration  *Elevated troponin likely in the setting of supply demand ischemia from acute kidney injury and underlying sepsis Echocardiogram: 1.  The left ventricle has not been assessed. Left ventricular diastolic Doppler parameters are indeterminate.  2. The aortic valve is grossly normal. Aortic valve regurgitation was not assessed by color flow Doppler.  3. non diagnostic study due to inability to fully visualize the heart.  * Thrombocytopenia secondary to alcohol abuse. No acute bleeding presently Avoid anticoagulation  *History of coronary disease status post PCI and stent placement Stable Continue Plavix, statin, carvedilol  *Depression Stable on reduced dose of Zoloft  *Neuropathy Stable Neurontin held for altered mental status  * Altered mental status Much improved-appears at baseline Suspected due to medication side effect-Neurontin CT head is negative for stroke, ammonia not elevated, as per wife patient was drowsy for last 1 week    Patient's daughter was very concerned as patient and his wife both are wheelchair-bound and they does not take care of properly, there is an open case with Department of Social Security for further care of patient, social work/case management assisting with disposition planning as patient is a unsafe discharge to home  All the records are reviewed and case discussed with Care Management/Social Workerr. Management plans discussed with the patient, family and they are in agreement.  CODE STATUS: Full code.  TOTAL TIME TAKING CARE OF THIS PATIENT: 35 minutes.   I spoke to  patient's wife on the phone.  POSSIBLE D/C IN 1-2 DAYS, DEPENDING ON CLINICAL CONDITION.    D  M.D on 04/01/2019   Between 7am to 6pm - Pager - [phone]  After 6pm go to [email or web address] - password [email or web address]  [email or web address]  [facility]  Office  [phone]  CC: Primary care physician; [facility]  Note: This dictation was prepared with Dragon dictation along with smaller phrase technology. Any transcriptional errors that result from this process are unintentional.  

## 2019-04-01 NOTE — Care Management Important Message (Signed)
Important Message  Patient Details  Name: Ryan Travis MRN: [id number] Date of Birth: September 03, 1941   Medicare Important Message Given:  Yes    Su Hilt, RN 04/01/2019, 9:43 AM

## 2019-04-01 NOTE — Progress Notes (Signed)
Date of Admission:  03/29/2019       Spoke to his wife who is also a poor historian because she suffered from stroke and cannot remember well. She says he goes to [place] [facility] [id number] ) , HE was recently admitted there for 3 days (  2 Weeks ago) for excess alcohol consumption- he drinks 1 pint of vodka a day. HE was feeling listless and weak and EMS was called.He has multiple myeloma and gets occasional shots Called his PA and left a message to call me back   Subjective: Says he is feeling better  Medications:  . budesonide (PULMICORT) nebulizer solution  0.5 mg Nebulization BID  . carvedilol  6.25 mg Oral BID WC  . folic acid  1 mg Oral Daily  . hydrochlorothiazide  25 mg Oral Daily  . ipratropium-albuterol  3 mL Nebulization TID  . losartan  25 mg Oral Daily  . sertraline  100 mg Oral QHS  . simvastatin  40 mg Oral q1800    Objective: Vital signs in last 24 hours: Temp:  [97.7 F (36.5 C)-98.3 F (36.8 C)] 97.7 F (36.5 C) (05/27 0405) Pulse Rate:  [75-115] 105 (05/27 0547) Resp:  [17-21] 21 (05/27 0405) BP: (107-151)/(76-106) 107/82 (05/27 0547) SpO2:  [94 %-97 %] 94 % (05/27 0405)  PHYSICAL EXAM:  General: Alert, cooperative, no distress,   Lungs: b/l air entry Heart: s1s2 Abdomen: Soft, non-tender,not distended. Bowel sounds normal. No masses Extremities: rt ankle scar Rt great toe- avulsed nail- exposed bed Skin: No rashes or lesions.  Lymph: Cervical, supraclavicular normal. Neurologic: Grossly non-focal  Lab Results Recent Labs    03/31/19 0523 04/01/19 0501  WBC 3.5* 6.4  HGB 10.1* 11.1*  HCT 29.1* 31.8*  NA 136 136  K 3.3* 3.4*  CL 104 102  CO2 25 24  BUN 23 13  CREATININE 0.62 0.65   Liver Panel Recent Labs    03/30/19 0449 03/31/19 0523 04/01/19 0501  PROT 5.6* 5.0* 5.7*  ALBUMIN 2.8* 2.3* 2.5*  AST 140* 133* 136*  ALT 67* 54* 55*  ALKPHOS 186* 162* 175*  BILITOT 1.7* 1.1 1.3*  BILIDIR 0.6*  --   --   IBILI 1.1*  --    --    Sedimentation Rate Recent Labs    03/29/19 1541  ESRSEDRATE 5   C-Reactive Protein No results for input(s): CRP in the last 72 hours.  Microbiology: 5/24- BC - hemophilus influenza 5/27  Studies/Results: Ct Head Wo Contrast  Result Date: 03/30/2019 CLINICAL DATA:  Cough, shortness of breath, altered mental status EXAM: CT HEAD WITHOUT CONTRAST TECHNIQUE: Contiguous axial images were obtained from the base of the skull through the vertex without intravenous contrast. COMPARISON:  01/09/2019 FINDINGS: Brain: No evidence of acute infarction, hemorrhage, hydrocephalus, extra-axial collection or mass lesion/mass effect. Periventricular white matter hypodensity and global volume loss. Nonacute right cerebellar infarction. Vascular: No hyperdense vessel or unexpected calcification. Skull: Evidence of prior left frontal and parietal burr hole craniotomies. Negative for fracture or focal lesion. Sinuses/Orbits: No acute finding. Other: None. IMPRESSION: 1. No acute intracranial pathology. 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 2. Evidence of prior left frontal and parietal burr hole craniotomies. 3.  Nonacute right cerebellar infarction. Electronically Signed   By: Eddie Candle M.D.   On: 03/30/2019 17:13     Assessment/Plan: 78 year old male with history of alcohol abuse, hypertension presents with altered mental status, cough and shortness of breath  Haemophilus influenza bacteremia:  Likely source is respiratory tract.  There is no obvious pneumonia on chest x-ray.  continue  ceftriaxone IV.  He will need at least 10 days of antibiotics .  Initially IV and we may be able to transition to p.o. quinolone when ready for discharge.Marland Kitchen He is at risk for infection  because of MM and alcoholism  Avulsion of the right great toenail.  No obvious infection.  Encephalopathy: improving Could be related to the infection versus alcohol.  Watch for DTs.  Supplement thiamine  folate  AKI has resolved  Hypertension on ACE inhibitor, hydrochlorothiazide and carvedilol  Hypokalemia -being supplemented.  Macrocytic anemia  Thrombocytopenia chronic.  Could be related to alcohol.  HIV test done- result pending  B12 and folate okay  Multiple myeloma -communicated with the PA at [facility]- not on any treatment- stable  Transaminitis.  Could be related to alcohol, may have underlying cirrhosis. ?Discussed the management with his wife and his PA?

## 2019-04-01 NOTE — Progress Notes (Signed)
Notified MD of pt's elevation of blood pressure and complaint of having difficulty breathing.  02 sat 94% on room air. This nurse also requested medication for anxiety. Received new order for anxiety and blood pressure.

## 2019-04-02 LAB — HIV ANTIBODY (ROUTINE TESTING W REFLEX): HIV Screen 4th Generation wRfx: NONREACTIVE

## 2019-04-02 MED ORDER — CARVEDILOL 6.25 MG PO TABS: 6.2500 mg | ORAL_TABLET | Freq: Two times a day (BID) | ORAL | 0 refills | Status: AC

## 2019-04-02 MED ORDER — LEVOFLOXACIN 750 MG PO TABS: 750.0000 mg | ORAL_TABLET | Freq: Every day | ORAL | 0 refills | Status: AC

## 2019-04-02 MED ORDER — GABAPENTIN 100 MG PO CAPS: 200.0000 mg | ORAL_CAPSULE | Freq: Every day | ORAL | 0 refills | Status: AC

## 2019-04-02 MED ORDER — SERTRALINE HCL 100 MG PO TABS: 100.0000 mg | ORAL_TABLET | Freq: Every day | ORAL | 0 refills | Status: AC

## 2019-04-02 NOTE — Progress Notes (Signed)
Received MD order to discharge patient to home, reviewed home meds, prescriptions, discharge instructions and follow up appointments with patient and patient verbalized understanding

## 2019-04-02 NOTE — Progress Notes (Signed)
Received MD order to discharge patient to home reviewed home meds, discharged instructions, prescriptions and follow up appointments  with patient and wife and both verbalized understanding also reviewed care for right foot

## 2019-04-02 NOTE — Evaluation (Signed)
Physical Therapy Evaluation Patient Details Name: Ryan Travis MRN: [id number] DOB: 03-12-1941 Today's Date: 04/02/2019   History of Present Illness  78 y.o. male with a known history of alcohol abuse, anxiety, history of multiple myeloma, depression, neuropathy, essential hypertension, restless leg syndrome who presents from home due to cough, shortness of breath.   Clinical Impression  Pt sleeping on arrival and states he needs to rest, but was willing to participate with light mobility tasks to prove that he will be able to return home.  He admits to being weaker than his baseline but was able to transfer "down hill" to recliner (arm rest lowered for scoot transfer) and needed light assist to get back up into bed.  He reports she does have aides daily and is confident that even if he he weaker he will be able to manage with the help that he has available.  Pt open to the idea of having a PT come to the home to continue working on strength, mobility, etc. Pt eager to leave as soon as he is able.    Follow Up Recommendations Home health PT    Equipment Recommendations  None recommended by PT    Recommendations for Other Services       Precautions / Restrictions Precautions Precautions: Fall Restrictions Weight Bearing Restrictions: No      Mobility  Bed Mobility Overal bed mobility: Modified Independent             General bed mobility comments: Pt needed bed rail/HHA to get to sitting but was able to shift LEs to EOB  Transfers Overall transfer level: Needs assistance Equipment used: None Transfers: Squat Pivot Transfers     Squat pivot transfers: Min assist;Min guard     General transfer comment: Pt was able to transfer "down hill" to recliner w/o assist using multi-stage side scoots, did need light assist to get back to bed "up hill"  Ambulation/Gait             General Gait Details: Pt reports he has been w/c bound since the 70s  Stairs             Wheelchair Mobility    Modified Rankin (Stroke Patients Only)       Balance Overall balance assessment: Needs assistance   Sitting balance-Leahy Scale: Good       Standing balance-Leahy Scale: Zero                               Pertinent Vitals/Pain Pain Assessment: 0-10 Pain Score: (chronic back and knee pain)    Home Living Family/patient expects to be discharged to:: Private residence Living Arrangements: Spouse/significant other Available Help at Discharge: Personal care attendant(has aides QD (2 hrs M-F, more on weekend))   Home Access: Ramped entrance     Home Layout: One level Home Equipment: Grab bars - toilet;Grab bars - tub/shower;Walker - 2 wheels;Wheelchair - manual      Prior Function Level of Independence: Needs assistance   Gait / Transfers Assistance Needed: w/c bound, states he typically can transfer w/o assist           Hand Dominance        Extremity/Trunk Assessment   Upper Extremity Assessment Upper Extremity Assessment: Generalized weakness(pt admits baseline weakness, but now feeling "weak as water")    Lower Extremity Assessment Lower Extremity Assessment: Generalized weakness(b/l knees with limited ROM, poor strength)  Communication   Communication: No difficulties  Cognition Arousal/Alertness: Lethargic Behavior During Therapy: Anxious Overall Cognitive Status: Within Functional Limits for tasks assessed                                        General Comments General comments (skin integrity, edema, etc.): Pt fatigued and reports feeling very weak and did not wish to do much of anything.  Did agree to mobility to show that he is able to go home.    Exercises     Assessment/Plan    PT Assessment Patient needs continued PT services  PT Problem List Decreased strength;Decreased range of motion;Decreased activity tolerance;Decreased mobility;Decreased knowledge of use of DME;Decreased  safety awareness       PT Treatment Interventions DME instruction;Functional mobility training;Therapeutic activities;Therapeutic exercise;Balance training;Neuromuscular re-education;Patient/family education    PT Goals (Current goals can be found in the Care Plan section)  Acute Rehab PT Goals Patient Stated Goal: go home ASAP PT Goal Formulation: With patient Time For Goal Achievement: 04/16/19 Potential to Achieve Goals: Good    Frequency Min 2X/week   Barriers to discharge        Co-evaluation               AM-PAC PT "6 Clicks" Mobility  Outcome Measure Help needed turning from your back to your side while in a flat bed without using bedrails?: A Little Help needed moving from lying on your back to sitting on the side of a flat bed without using bedrails?: A Little Help needed moving to and from a bed to a chair (including a wheelchair)?: A Little Help needed standing up from a chair using your arms (e.g., wheelchair or bedside chair)?: Total Help needed to walk in hospital room?: Total Help needed climbing 3-5 steps with a railing? : Total 6 Click Score: 12    End of Session Equipment Utilized During Treatment: Gait belt Activity Tolerance: Patient limited by fatigue Patient left: with bed alarm set;with call bell/phone within reach Nurse Communication: Mobility status PT Visit Diagnosis: Muscle weakness (generalized) (M62.81);Other abnormalities of gait and mobility (R26.89)    Time: 7981-0254 PT Time Calculation (min) (ACUTE ONLY): 29 min   Charges:   PT Evaluation $PT Eval Low Complexity: 1 Low PT Treatments $Therapeutic Activity: 8-22 mins        Kreg Shropshire, DPT 04/02/2019, 3:09 PM

## 2019-04-02 NOTE — TOC Transition Note (Signed)
Transition of Care [facility]) - CM/SW Discharge Note   Patient Details  Name: Ryan Travis MRN: [id number] Date of Birth: October 01, 1941  [facility] Care [facility]) CM/SW Contact:  Annamaria Boots, [place] Phone Number: 04/02/2019, 1:59 PM   Clinical Narrative:  Patient lives at home with wife. He is wheelchair bound and wife is also wheelchair bound. Wife states that they have caregivers that come in throughout the week provided by the [place]. CSW spoke with patient's daughter also who has a lot of concerns about patient and wife. Daughter reports that both patient and wife are alcoholics and that they are not safe to be alone. Daughter also reports that she has done an APS report regarding her concerns. CSW contacted DSS and they do have an open case with this patient and will follow after discharge. Per DSS they have no concerns with patient returning home. Patient is medically ready to discharge today. CSW notified wife and she states that patient will be picked up by a friend. CSW also set up home health services through [place].      Final next level of care: Home w Home Health Services Barriers to Discharge: No Barriers Identified   Patient Goals and CMS Choice   CMS Medicare.gov Compare Post Acute Care list provided to:: Patient Represenative (must comment)(patient's wife ) Choice offered to / list presented to : Spouse  Discharge Placement                       Discharge Plan and Services     Post Acute Care Choice: Home Health                    HH Arranged: RN, Nurse's Aide, Social Work [employer] Agency: [place] ([place]) Date [place]: 04/02/19 Time [place]: [id number] Representative spoke with at [place]: [place] (SDOH) Interventions     Readmission Risk Interventions No flowsheet data found.

## 2019-04-02 NOTE — Discharge Summary (Signed)
[place] [facility] [place] NAME: Ryan Travis    MR#:  [id number]  DATE OF BIRTH:  1941/09/03  DATE OF ADMISSION:  03/29/2019 ADMITTING PHYSICIAN: Henreitta Leber, MD  DATE OF DISCHARGE: No discharge date for patient encounter.  PRIMARY CARE PHYSICIAN: [facility]    ADMISSION DIAGNOSIS:  Lactic acidosis [E87.2] Wound, open, foot [S91.309A] Elevated troponin I level [R79.89] Altered mental status, unspecified altered mental status type [R41.82]  DISCHARGE DIAGNOSIS:  Active Problems:   Sepsis ([place])   SECONDARY DIAGNOSIS:   Past Medical History:  Diagnosis Date  . Alcohol abuse   . Anxiety   . Arthritis   . 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 Cancer ([place])    multiple myeloma  . Depression   . Dysrhythmia   . Fall    3 weeks ago/ torn ligaments left knee/wearing brace/pt  . Hypertension   . Neuropathy   . Pain    chronic leg and ankle pain  . RLS (restless legs syndrome)     HOSPITAL COURSE:  78 y.o.malewith a known history of alcohol abuse, anxiety, history of multiple myeloma, depression, neuropathy, essential hypertension, restless leg syndrome who presents from home due to cough, shortness of breath.  *Sepsis Secondary to Haemophilus influenza bacteremia Resolved Treated on our sepsis protocol, infectious disease did see patient while in house -recommended Rocephin while in house with conversion to Levaquin status post discharge, repeat blood cultures were negative   *Acute Haemophilus influenza bacteremia Resolving Plan of care as stated above  *Acute right great toenail avulsion Stable As per podiatry/Dr. Toney Rakes antibiotics recommended, infectious disease did see patient while in house  Conservative medical management  *AKI Resolved with IV fluids for rehydration  secondary to alcohol abuse and dehydration  *Elevated troponin likely in the setting of supply demand ischemia from  acute kidney injury and underlying sepsis Echocardiogram: 1.  The left ventricle has not been assessed. Left ventricular diastolic Doppler parameters are indeterminate. 2. The aortic valve is grossly normal. Aortic valve regurgitation was not assessed by color flow Doppler. 3. non diagnostic study due to inability to fully visualize the heart.  *Thrombocytopenia secondary to alcohol abuse. No acute bleeding presently Avoided anticoagulation  *History of coronary disease status post PCI and stent placement Stable Continued Plavix, statin, carvedilol  *Depression Stable on reduced dose of Zoloft  *Neuropathy Stable Neurontin held for altered mental status while in house, dose significantly reduced status post discharge 200 mg at bedtime  * Altered mental status Resolved-appears at baseline Suspected due to medication side effect-Neurontin which was significantly reduced as stated above CT head is negative for stroke, ammonia not elevated, as per wife patient was drowsy for last 1 week    Case discussed with the patient's wife, case management/social work, the patient's daughter, all questions answered, patient for home with home health services status post discharge   DISCHARGE CONDITIONS:   stable  CONSULTS OBTAINED:  Treatment Team:  Sharlotte Alamo, DPM Tsosie Billing, MD Salary, Avel Peace, MD  DRUG ALLERGIES:   Allergies  Allergen Reactions  . Azithromycin Other (See Comments)    Other reaction(s): Unknown   . Morphine And Related   . Neomycin   . Other     METHIOLATE  . Penicillins     Did it involve swelling of the face/tongue/throat, SOB, or low BP? Yes Did it involve sudden or severe rash/hives, skin peeling, or any reaction on the inside of  your mouth or nose? Yes Did you need to seek medical attention at a hospital or doctor's office? Yes When did it last happen? 10 years If all above answers are "NO", may proceed with cephalosporin use.     DISCHARGE MEDICATIONS:   Allergies as of 04/02/2019      Reactions   Azithromycin Other (See Comments)   Other reaction(s): Unknown   Morphine And Related    Neomycin    Other    METHIOLATE   Penicillins    Did it involve swelling of the face/tongue/throat, SOB, or low BP? Yes Did it involve sudden or severe rash/hives, skin peeling, or any reaction on the inside of your mouth or nose? Yes Did you need to seek medical attention at a hospital or doctor's office? Yes When did it last happen? 10 years If all above answers are "NO", may proceed with cephalosporin use.      Medication List    STOP taking these medications   hydrochlorothiazide 25 MG tablet Commonly known as:  HYDRODIURIL     TAKE these medications   carvedilol 6.25 MG tablet Commonly known as:  COREG Take 1 tablet (6.25 mg total) by mouth 2 (two) times daily with a meal. What changed:    medication strength  how much to take   folic acid 1 MG tablet Commonly known as:  FOLVITE Take 1 mg by mouth daily.   gabapentin 100 MG capsule Commonly known as:  NEURONTIN Take 2 capsules (200 mg total) by mouth at bedtime. What changed:    medication strength  how much to take  when to take this   HYDROcodone-acetaminophen 10-325 MG tablet Commonly known as:  NORCO Take 2 tablets by mouth daily.   levofloxacin 750 MG tablet Commonly known as:  Levaquin Take 1 tablet (750 mg total) by mouth daily for 7 days.   losartan 25 MG tablet Commonly known as:  COZAAR Take 25 mg by mouth daily.   omeprazole 20 MG capsule Commonly known as:  PRILOSEC Take 20 mg by mouth daily.   sertraline 100 MG tablet Commonly known as:  ZOLOFT Take 1 tablet (100 mg total) by mouth at bedtime. What changed:    how much to take  when to take this   simvastatin 40 MG tablet Commonly known as:  ZOCOR Take 40 mg by mouth at bedtime.        DISCHARGE INSTRUCTIONS:      If you experience worsening of your  admission symptoms, develop shortness of breath, life threatening emergency, suicidal or homicidal thoughts you must seek medical attention immediately by calling 911 or calling your MD immediately  if symptoms less severe.  You Must read complete instructions/literature along with all the possible adverse reactions/side effects for all the Medicines you take and that have been prescribed to you. Take any new Medicines after you have completely understood and accept all the possible adverse reactions/side effects.   Please note  You were cared for by a hospitalist during your hospital stay. If you have any questions about your discharge medications or the care you received while you were in the hospital after you are discharged, you can call the unit and asked to speak with the hospitalist on call if the hospitalist that took care of you is not available. Once you are discharged, your primary care physician will handle any further medical issues. Please note that NO REFILLS for any discharge medications will be authorized once you are discharged, as  it is imperative that you return to your primary care physician (or establish a relationship with a primary care physician if you do not have one) for your aftercare needs so that they can reassess your need for medications and monitor your lab values.    Today   CHIEF COMPLAINT:   Chief Complaint  Patient presents with  . Shortness of Breath    HISTORY OF PRESENT ILLNESS:   y.o. male with a known history of alcohol abuse, anxiety, history of multiple myeloma, depression, neuropathy, essential hypertension, restless leg syndrome who presents from home due to cough, shortness of breath.  Patient is somewhat lethargic and a very poor historian.  Patient says he presents to the hospital because of just not feeling well but as per the wife patient was complaining of some cough and shortness of breath.  Patient's COVID-19 test here in the ER is negative.   In the ER patient met sepsis criteria given his elevated lactic acid, tachycardia and a right great toe which looks red and has a foul-smelling discharge.  Patient's x-ray of the right foot did not show any evidence of osteomyelitis.  She denies any chest pains, nausea, vomiting, fever, chills, abdominal pain.  He does state that he has been having some loose stools but no diarrhea was noted here in the ER.  Hospitalist services were contacted for admission given patient meeting sepsis criteria and noted to be in mild acute kidney injury with an elevated troponin.   VITAL SIGNS:  Blood pressure 105/82, pulse 98, temperature 99.4 F (37.4 C), temperature source Oral, resp. rate 17, height '5\' 10"'  (1.778 m), weight 77.1 kg, SpO2 98 %.  I/O:    Intake/Output Summary (Last 24 hours) at 04/02/2019 1216 Last data filed at 04/02/2019 0946 Gross per 24 hour  Intake 1151.57 ml  Output 950 ml  Net 201.57 ml    PHYSICAL EXAMINATION:  GENERAL:  78 y.o.-year-old patient lying in the bed with no acute distress.  EYES: Pupils equal, round, reactive to light and accommodation. No scleral icterus. Extraocular muscles intact.  HEENT: Head atraumatic, normocephalic. Oropharynx and nasopharynx clear.  NECK:  Supple, no jugular venous distention. No thyroid enlargement, no tenderness.  LUNGS: Normal breath sounds bilaterally, no wheezing, rales,rhonchi or crepitation. No use of accessory muscles of respiration.  CARDIOVASCULAR: S1, S2 normal. No murmurs, rubs, or gallops.  ABDOMEN: Soft, non-tender, non-distended. Bowel sounds present. No organomegaly or mass.  EXTREMITIES: No pedal edema, cyanosis, or clubbing.  NEUROLOGIC: Cranial nerves II through XII are intact. Muscle strength 5/5 in all extremities. Sensation intact. Gait not checked.  PSYCHIATRIC: The patient is alert and oriented x 3.  SKIN: No obvious rash, lesion, or ulcer.   DATA REVIEW:   CBC Recent Labs  Lab 04/01/19 0501  WBC 6.4  HGB  11.1*  HCT 31.8*  PLT 77*    Chemistries  Recent Labs  Lab 03/29/19 1541  04/01/19 0501  NA 136   < > 136  K 3.1*   < > 3.4*  CL 96*   < > 102  CO2 18*   < > 24  GLUCOSE 126*   < > 110*  BUN 57*   < > 13  CREATININE 1.36*   < > 0.65  CALCIUM 8.3*   < > 7.3*  MG 1.7  --   --   AST  --    < > 136*  ALT  --    < > 55*  ALKPHOS  --    < >  175*  BILITOT  --    < > 1.3*   < > = values in this interval not displayed.    Cardiac Enzymes Recent Labs  Lab 03/31/19 0523  TROPONINI 0.04*    Microbiology Results  Results for orders placed or performed during the hospital encounter of 03/29/19  SARS Coronavirus 2 (CEPHEID- Performed in [place] hospital lab), Hosp Order     Status: None   Collection Time: 03/29/19  3:41 PM  Result Value Ref Range Status   SARS Coronavirus 2 NEGATIVE NEGATIVE Final    Comment: (NOTE) If result is NEGATIVE SARS-CoV-2 target nucleic acids are NOT DETECTED. The SARS-CoV-2 RNA is generally detectable in upper and lower  respiratory specimens during the acute phase of infection. The lowest  concentration of SARS-CoV-2 viral copies this assay can detect is 250  copies / mL. A negative result does not preclude SARS-CoV-2 infection  and should not be used as the sole basis for treatment or other  patient management decisions.  A negative result may occur with  improper specimen collection / handling, submission of specimen other  than nasopharyngeal swab, presence of viral mutation(s) within the  areas targeted by this assay, and inadequate number of viral copies  (<250 copies / mL). A negative result must be combined with clinical  observations, patient history, and epidemiological information. If result is POSITIVE SARS-CoV-2 target nucleic acids are DETECTED. The SARS-CoV-2 RNA is generally detectable in upper and lower  respiratory specimens dur ing the acute phase of infection.  Positive  results are indicative of active infection with  SARS-CoV-2.  Clinical  correlation with patient history and other diagnostic information is  necessary to determine patient infection status.  Positive results do  not rule out bacterial infection or co-infection with other viruses. If result is PRESUMPTIVE POSTIVE SARS-CoV-2 nucleic acids MAY BE PRESENT.   A presumptive positive result was obtained on the submitted specimen  and confirmed on repeat testing.  While 2019 novel coronavirus  (SARS-CoV-2) nucleic acids may be present in the submitted sample  additional confirmatory testing may be necessary for epidemiological  and / or clinical management purposes  to differentiate between  SARS-CoV-2 and other Sarbecovirus currently known to infect humans.  If clinically indicated additional testing with an alternate test  methodology [phone]) is advised. The SARS-CoV-2 RNA is generally  detectable in upper and lower respiratory sp ecimens during the acute  phase of infection. The expected result is Negative. Fact Sheet for Patients:  StrictlyIdeas.no Fact Sheet for Healthcare Providers: BankingDealers.co.za This test is not yet approved or cleared by the [place] FDA and has been authorized for detection and/or diagnosis of SARS-CoV-2 by FDA under an Emergency Use Authorization (EUA).  This EUA will remain in effect (meaning this test can be used) for the duration of the COVID-19 declaration under Section 564(b)(1) of the Act, 21 U.S.C. section 360bbb-3(b)(1), unless the authorization is terminated or revoked sooner. Performed at [facility], [address] [place], [place] [address]   Blood culture (routine x 2)     Status: Abnormal (Preliminary result)   Collection Time: 03/29/19  3:52 PM  Result Value Ref Range Status   Specimen Description   Final    BLOOD LEFT ANTECUBITAL Performed at [facility], [address] [place], [place] [address]    Special  Requests   Final    BOTTLES DRAWN AEROBIC AND ANAEROBIC Blood Culture results may not be optimal due to an excessive  volume of blood received in culture bottles Performed at [facility], [address] [place], [place] [address]    Culture  Setup Time   Final    Organism ID to follow [place] CRITICAL RESULT CALLED TO, READ BACK BY AND VERIFIED WITH: S MARTIN 03/30/2019 AT 1515 BY [facility] Performed at [facility], [address], [place], [place] [address]    Culture (A)  Final    HAEMOPHILUS INFLUENZAE BETA LACTAMASE POSITIVE HEALTH DEPARTMENT NOTIFIED Referred to [facility] in [place], [place] for serotyping. Performed at [facility], [place] [address], [place], [place] [address]    Report Status PENDING  Incomplete  Blood culture (routine x 2)     Status: None (Preliminary result)   Collection Time: 03/29/19  3:52 PM  Result Value Ref Range Status   Specimen Description BLOOD BLOOD LEFT WRIST  Final   Special Requests   Final    BOTTLES DRAWN AEROBIC AND ANAEROBIC Blood Culture results may not be optimal due to an excessive volume of blood received in culture bottles   Culture   Final    NO GROWTH 4 DAYS Performed at [facility], [address] [place], [place] [address]    Report Status PENDING  Incomplete  Blood Culture ID Panel (Reflexed)     Status: Abnormal   Collection Time: 03/29/19  3:52 PM  Result Value Ref Range Status   Enterococcus species NOT DETECTED NOT DETECTED Final   Listeria monocytogenes NOT DETECTED NOT DETECTED Final   Staphylococcus species NOT DETECTED NOT DETECTED Final   Staphylococcus aureus (BCID) NOT DETECTED NOT DETECTED Final   Streptococcus species NOT DETECTED NOT DETECTED Final   Streptococcus agalactiae NOT DETECTED NOT DETECTED Final   Streptococcus pneumoniae NOT DETECTED NOT DETECTED Final   Streptococcus pyogenes NOT DETECTED NOT DETECTED Final    Acinetobacter baumannii NOT DETECTED NOT DETECTED Final   Enterobacteriaceae species NOT DETECTED NOT DETECTED Final   Enterobacter cloacae complex NOT DETECTED NOT DETECTED Final   Escherichia coli NOT DETECTED NOT DETECTED Final   Klebsiella oxytoca NOT DETECTED NOT DETECTED Final   Klebsiella pneumoniae NOT DETECTED NOT DETECTED Final   Proteus species NOT DETECTED NOT DETECTED Final   Serratia marcescens NOT DETECTED NOT DETECTED Final   Haemophilus influenzae DETECTED (A) NOT DETECTED Final    Comment: CRITICAL RESULT CALLED TO, READ BACK BY AND VERIFIED WITH: S MARTIN 03/30/2019 AT 1515 BY H SOEWARDIMAN    Neisseria meningitidis NOT DETECTED NOT DETECTED Final   Pseudomonas aeruginosa NOT DETECTED NOT DETECTED Final   Candida albicans NOT DETECTED NOT DETECTED Final   Candida glabrata NOT DETECTED NOT DETECTED Final   Candida krusei NOT DETECTED NOT DETECTED Final   Candida parapsilosis NOT DETECTED NOT DETECTED Final   Candida tropicalis NOT DETECTED NOT DETECTED Final    Comment: Performed at [facility], [address] [place], [place] [address]  Urine Culture     Status: None   Collection Time: 03/31/19  3:38 PM  Result Value Ref Range Status   Specimen Description   Final    URINE, CLEAN CATCH Performed at [facility], [address], [place], [place] [address]    Special Requests   Final    NONE Performed at [facility], [address] [place], [place] [address]    Culture   Final    NO GROWTH Performed at [facility], [address] [place], [place] [address]    Report Status 04/01/2019 FINAL  Final  CULTURE, BLOOD (ROUTINE X 2) w Reflex to ID Panel     Status: None (Preliminary result)   Collection Time: 04/01/19 12:08 AM  Result Value Ref Range Status   Specimen Description BLOOD RIGHT ASSIST CONTROL  Final   Special Requests   Final    BOTTLES DRAWN AEROBIC AND ANAEROBIC Blood Culture adequate volume   Culture   Final     NO GROWTH 1 DAY Performed at [facility], [address], [place], [place] [address]    Report Status PENDING  Incomplete  CULTURE, BLOOD (ROUTINE X 2) w Reflex to ID Panel     Status: None (Preliminary result)   Collection Time: 04/01/19 12:16 AM  Result Value Ref Range Status   Specimen Description BLOOD RIGHT WRIST  Final   Special Requests   Final    BOTTLES DRAWN AEROBIC AND ANAEROBIC Blood Culture results may not be optimal due to an excessive volume of blood received in culture bottles   Culture   Final    NO GROWTH 1 DAY Performed at [facility], [address] [place], [place] [address]    Report Status PENDING  Incomplete    RADIOLOGY:  No results found.  EKG:   Orders placed or performed during the hospital encounter of 03/29/19  . ED EKG  . ED EKG  . EKG 12-Lead  . EKG 12-Lead      Management plans discussed with the patient, family and they are in agreement.  CODE STATUS:     Code Status Orders  (From admission, onward)         Start     Ordered   03/29/19 1921  Full code  Continuous     03/29/19 1920        Code Status History    Date Active Date Inactive Code Status Order ID Comments User Context   01/08/2019 1021 01/12/2019 1756 Full Code [id number]  Harrie Foreman, MD Inpatient   10/16/2016 1834 10/19/2016 1632 Full Code [id number]  Nelva Bush, MD Inpatient      TOTAL TIME TAKING CARE OF THIS PATIENT: 35 minutes.    Avel Peace Salary M.D on 04/02/2019 at 12:16 PM  Between 7am to 6pm - Pager - [phone]  After 6pm go to [email or web address] - password [email or web address] [place] [facility]  Office  [phone]  CC: Primary care physician; [facility]   Note: This dictation was prepared with Dragon dictation along with smaller phrase technology. Any transcriptional errors that result from this process are unintentional.

## 2019-04-03 LAB — CULTURE, BLOOD (ROUTINE X 2): Culture: NO GROWTH

## 2019-04-04 ENCOUNTER — Emergency Department: Payer: No Typology Code available for payment source

## 2019-04-04 ENCOUNTER — Emergency Department
Admission: EM | Admit: 2019-04-04 | Discharge: 2019-04-04 | Disposition: A | Discharge status: Other Acute Inpatient Hospital | Payer: No Typology Code available for payment source | Attending: Emergency Medicine | Admitting: Emergency Medicine

## 2019-04-04 ENCOUNTER — Encounter: Payer: Self-pay | Admitting: Emergency Medicine

## 2019-04-04 DIAGNOSIS — F419 Anxiety disorder, unspecified: Secondary | ICD-10-CM | POA: Insufficient documentation

## 2019-04-04 DIAGNOSIS — F17210 Nicotine dependence, cigarettes, uncomplicated: Secondary | ICD-10-CM | POA: Insufficient documentation

## 2019-04-04 DIAGNOSIS — Z20828 Contact with and (suspected) exposure to other viral communicable diseases: Secondary | ICD-10-CM | POA: Insufficient documentation

## 2019-04-04 DIAGNOSIS — A419 Sepsis, unspecified organism: Secondary | ICD-10-CM

## 2019-04-04 DIAGNOSIS — R778 Other specified abnormalities of plasma proteins: Secondary | ICD-10-CM

## 2019-04-04 DIAGNOSIS — Z79899 Other long term (current) drug therapy: Secondary | ICD-10-CM | POA: Insufficient documentation

## 2019-04-04 DIAGNOSIS — R7989 Other specified abnormal findings of blood chemistry: Secondary | ICD-10-CM | POA: Diagnosis not present

## 2019-04-04 DIAGNOSIS — R0602 Shortness of breath: Secondary | ICD-10-CM | POA: Insufficient documentation

## 2019-04-04 DIAGNOSIS — R531 Weakness: Secondary | ICD-10-CM | POA: Diagnosis present

## 2019-04-04 DIAGNOSIS — I10 Essential (primary) hypertension: Secondary | ICD-10-CM | POA: Insufficient documentation

## 2019-04-04 LAB — CBC WITH DIFFERENTIAL/PLATELET
Abs Immature Granulocytes: 0.13 10*3/uL — ABNORMAL HIGH (ref 0.00–0.07)
Basophils Absolute: 0 10*3/uL (ref 0.0–0.1)
Basophils Relative: 0 %
Eosinophils Absolute: 0 10*3/uL (ref 0.0–0.5)
Eosinophils Relative: 0 %
HCT: 33.2 % — ABNORMAL LOW (ref 39.0–52.0)
Hemoglobin: 11.4 g/dL — ABNORMAL LOW (ref 13.0–17.0)
Immature Granulocytes: 1 %
Lymphocytes Relative: 12 %
Lymphs Abs: 1.6 10*3/uL (ref 0.7–4.0)
MCH: 34.8 pg — ABNORMAL HIGH (ref 26.0–34.0)
MCHC: 34.3 g/dL (ref 30.0–36.0)
MCV: 101.2 fL — ABNORMAL HIGH (ref 80.0–100.0)
Monocytes Absolute: 1 10*3/uL (ref 0.1–1.0)
Monocytes Relative: 8 %
Neutro Abs: 10.9 10*3/uL — ABNORMAL HIGH (ref 1.7–7.7)
Neutrophils Relative %: 79 %
Platelets: 158 10*3/uL (ref 150–400)
RBC: 3.28 MIL/uL — ABNORMAL LOW (ref 4.22–5.81)
RDW: 13.1 % (ref 11.5–15.5)
WBC: 13.8 10*3/uL — ABNORMAL HIGH (ref 4.0–10.5)
nRBC: 0.1 % (ref 0.0–0.2)

## 2019-04-04 LAB — URINALYSIS, COMPLETE (UACMP) WITH MICROSCOPIC
Bacteria, UA: NONE SEEN
Glucose, UA: 50 mg/dL — AB
Ketones, ur: 5 mg/dL — AB
Leukocytes,Ua: NEGATIVE
Nitrite: NEGATIVE
Protein, ur: 30 mg/dL — AB
Specific Gravity, Urine: 1.021 (ref 1.005–1.030)
Squamous Epithelial / HPF: NONE SEEN (ref 0–5)
pH: 5 (ref 5.0–8.0)

## 2019-04-04 LAB — COMPREHENSIVE METABOLIC PANEL
ALT: 32 U/L (ref 0–44)
AST: 46 U/L — ABNORMAL HIGH (ref 15–41)
Albumin: 2.6 g/dL — ABNORMAL LOW (ref 3.5–5.0)
Alkaline Phosphatase: 132 U/L — ABNORMAL HIGH (ref 38–126)
Anion gap: 13 (ref 5–15)
BUN: 12 mg/dL (ref 8–23)
CO2: 23 mmol/L (ref 22–32)
Calcium: 7.3 mg/dL — ABNORMAL LOW (ref 8.9–10.3)
Chloride: 100 mmol/L (ref 98–111)
Creatinine, Ser: 0.75 mg/dL (ref 0.61–1.24)
GFR calc Af Amer: >60 mL/min (ref >60)
GFR calc non Af Amer: >60 mL/min (ref >60)
Glucose, Bld: 85 mg/dL (ref 70–99)
Potassium: 2.8 mmol/L — ABNORMAL LOW (ref 3.5–5.1)
Sodium: 136 mmol/L (ref 135–145)
Total Bilirubin: 1.1 mg/dL (ref 0.3–1.2)
Total Protein: 6.3 g/dL — ABNORMAL LOW (ref 6.5–8.1)

## 2019-04-04 LAB — TROPONIN I: Troponin I: 0.07 ng/mL (ref <0.03)

## 2019-04-04 LAB — LACTIC ACID, PLASMA: Lactic Acid, Venous: 1.5 mmol/L (ref 0.5–1.9)

## 2019-04-04 LAB — ETHANOL: Alcohol, Ethyl (B): <10 mg/dL (ref <10)

## 2019-04-04 LAB — SARS CORONAVIRUS 2 BY RT PCR (HOSPITAL ORDER, PERFORMED IN ~~LOC~~ HOSPITAL LAB): SARS Coronavirus 2: NEGATIVE

## 2019-04-04 MED ORDER — ALBUTEROL SULFATE (2.5 MG/3ML) 0.083% IN NEBU
2.5000 mg | INHALATION_SOLUTION | Freq: Once | RESPIRATORY_TRACT | Status: AC
  Administered 2019-04-04: 2.5 mg via RESPIRATORY_TRACT
  Filled 2019-04-04: qty 3

## 2019-04-04 MED ORDER — SODIUM CHLORIDE 0.9 % IV SOLN
2.0000 g | Freq: Once | INTRAVENOUS | Status: AC
  Administered 2019-04-04: 2 g via INTRAVENOUS
  Filled 2019-04-04: qty 2

## 2019-04-04 MED ORDER — VANCOMYCIN HCL IN DEXTROSE 1-5 GM/200ML-% IV SOLN
1000.0000 mg | Freq: Once | INTRAVENOUS | Status: AC
  Administered 2019-04-04: 1000 mg via INTRAVENOUS
  Filled 2019-04-04: qty 200

## 2019-04-04 MED ORDER — SODIUM CHLORIDE 0.9 % IV BOLUS
1000.0000 mL | Freq: Once | INTRAVENOUS | Status: AC
  Administered 2019-04-04: 1000 mL via INTRAVENOUS

## 2019-04-04 MED ORDER — LORAZEPAM 2 MG/ML IJ SOLN
1.0000 mg | Freq: Once | INTRAMUSCULAR | Status: AC
  Administered 2019-04-04: 1 mg via INTRAVENOUS
  Filled 2019-04-04: qty 1

## 2019-04-04 NOTE — ED Notes (Signed)
EDT gave pt something to drink

## 2019-04-04 NOTE — ED Notes (Signed)
Pt given phone to speak with wife. 

## 2019-04-04 NOTE — ED Notes (Signed)
Dr Cherylann Banas notified of K 2.8- no new orders at this time

## 2019-04-04 NOTE — ED Triage Notes (Signed)
Pt arrived via EMS from home where pt called out due to increased SOB throughout night. Per EMS, pt was 94% on RA on arrival, 100% on 2L O2. Pt was discharged from hospital on 5/27 for cough and SOB as well as left foot wound. Pt sts, "I need something to calm me down."

## 2019-04-04 NOTE — ED Notes (Signed)
X-ray at bedside

## 2019-04-04 NOTE — ED Notes (Signed)
Report given to [place] at [facility] ED

## 2019-04-04 NOTE — ED Notes (Signed)
Turned light off for pt

## 2019-04-04 NOTE — ED Notes (Signed)
Dr Cherylann Banas notified of troponin level- no new orders at this time

## 2019-04-04 NOTE — ED Notes (Addendum)
Ryan Travis- Wife called and stated she wanted him sent to the [place] but EMS would not take him there and that if he has to be admitted then she wants him to be transferred- wants to be called with updates- states he is doing much better today and thinks it is just anxiety

## 2019-04-04 NOTE — ED Notes (Signed)
Pt given a blanket

## 2019-04-04 NOTE — ED Notes (Signed)
emtala reviewed by this RN 

## 2019-04-04 NOTE — ED Notes (Signed)
EDP at bedside- Pt states he is lethargic and has little energy- states he needs a breathing treatment and that he is short of breath

## 2019-04-04 NOTE — ED Notes (Signed)
Spoke with Shanon Brow at the AOD- was told to call report to [phone] ext [phone]

## 2019-04-04 NOTE — ED Provider Notes (Signed)
[facility] Emergency Department Provider Note ____________________________________________   First MD Initiated Contact with Patient 04/04/19 0710     (approximate)  I have reviewed the triage vital signs and the nursing notes.   HISTORY  Chief Complaint Shortness of Breath    HPI Ryan Travis is a 78 y.o. male with PMH as noted below who presents with generalized weakness and malaise over the last few days, associated with shortness of breath and anxiety.  The patient states that he has been feeling unwell since he left the hospital few days ago.  He denies fever, vomiting, or any acute pain at this time.  He states that his right great toe wound has been doing well, however.  Past Medical History:  Diagnosis Date  . Alcohol abuse   . Anxiety   . Arthritis   . 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 Cancer ([place])    multiple myeloma  . Depression   . Dysrhythmia   . Fall    3 weeks ago/ torn ligaments left knee/wearing brace/pt  . Hypertension   . Neuropathy   . Pain    chronic leg and ankle pain  . RLS (restless legs syndrome)     Patient Active Problem List   Diagnosis Date Noted  . Sepsis ([place]) 03/29/2019  . Alcoholic ketoacidosis 95/18/8416  . Pressure injury of skin 10/18/2016  . 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 STEMI (ST elevation myocardial infarction) ([place]) 10/16/2016  . Myocardial infarct ([place]) 10/16/2016    Past Surgical History:  Procedure Laterality Date  . BRAIN SURGERY     subdural hematoma  . CARDIAC CATHETERIZATION N/A 10/16/2016   Procedure: Left Heart Cath and Coronary Angiography;  Surgeon: Nelva Bush, MD;  Location: [facility];  Service: Cardiovascular;  Laterality: N/A;  . CARDIAC CATHETERIZATION N/A 10/16/2016   Procedure: Coronary Stent Intervention;  Surgeon: Nelva Bush, MD;  Location: [place];  Service: Cardiovascular;  Laterality: N/A;  . CARDIAC  CATHETERIZATION N/A 10/16/2016   Procedure: Temporary Pacemaker;  Surgeon: Nelva Bush, MD;  Location: [place];  Service: Cardiovascular;  Laterality: N/A;  . CATARACT EXTRACTION W/PHACO Right 08/16/2016   Procedure: CATARACT EXTRACTION PHACO AND INTRAOCULAR LENS PLACEMENT (IOC);  Surgeon: Eulogio Bear, MD;  Location: [facility];  Service: Ophthalmology;  Laterality: Right;  Lot # [id number] H [place]: 01:00.8 AP%:16.5 CDE: 10.02   . CATARACT EXTRACTION W/PHACO Left 10/04/2016   Procedure: CATARACT EXTRACTION PHACO AND INTRAOCULAR LENS PLACEMENT (IOC);  Surgeon: Eulogio Bear, MD;  Location: [facility];  Service: Ophthalmology;  Laterality: Left;  [place] 35.4 AP% 10.5 CDE 4.11 Fluid pack lot # [id number]  . EYE SURGERY    . FRACTURE SURGERY     multiple right ankle  . HERNIA REPAIR    . JOINT REPLACEMENT Bilateral    thr  . KNEE ARTHROSCOPY W/ ACL RECONSTRUCTION     left x 2  . rcr      Prior to Admission medications   Medication Sig Start Date End Date Taking? Authorizing Provider  carvedilol (COREG) 6.25 MG tablet Take 1 tablet (6.25 mg total) by mouth 2 (two) times daily with a meal. 04/02/19   Salary, Avel Peace, MD  folic acid (FOLVITE) 1 MG tablet Take 1 mg by mouth daily.    [provider]  gabapentin (NEURONTIN) 100 MG capsule Take 2 capsules (200 mg total) by mouth at bedtime. 04/02/19   Salary, Holly Bodily  D, MD  HYDROcodone-acetaminophen (NORCO) 10-325 MG tablet Take 2 tablets by mouth daily.     [provider]  levofloxacin (LEVAQUIN) 750 MG tablet Take 1 tablet (750 mg total) by mouth daily for 7 days. 04/02/19 04/09/19  Salary, Avel Peace, MD  losartan (COZAAR) 25 MG tablet Take 25 mg by mouth daily.    [provider]  omeprazole (PRILOSEC) 20 MG capsule Take 20 mg by mouth daily.    [provider]  sertraline (ZOLOFT) 100 MG tablet Take 1 tablet (100 mg total) by mouth at bedtime. 04/02/19   Salary, Avel Peace, MD  simvastatin (ZOCOR)  40 MG tablet Take 40 mg by mouth at bedtime.    [provider]    Allergies Azithromycin; Morphine and related; Neomycin; Other; and Penicillins  Family History  Problem Relation Age of Onset  . Heart attack Mother   . Heart attack Father     Social History Social History   Tobacco Use  . Smoking status: Current Every Day Smoker    Packs/day: 0.30    Years: 20.00    Pack years: 6.00    Types: Cigarettes  . Smokeless tobacco: Never Used  Substance Use Topics  . Alcohol use: Yes    Frequency: Never    Comment: 2-3 beers/day or wine  . Drug use: No    Review of Systems  Constitutional: No fever. Eyes: No redness. ENT: No sore throat. Cardiovascular: Denies chest pain. Respiratory: Positive for shortness of breath. Gastrointestinal: No vomiting or diarrhea.  Genitourinary: Negative for dysuria.  Musculoskeletal: Negative for back pain. Skin: Negative for rash. Neurological: Negative for headache.   ____________________________________________   PHYSICAL EXAM:  VITAL SIGNS: ED Triage Vitals  Enc Vitals Group     BP 04/04/19 0708 (!) 127/108     Pulse Rate 04/04/19 0706 (!) 111     Resp 04/04/19 0706 20     Temp 04/04/19 0706 98.7 F (37.1 C)     Temp Source 04/04/19 0706 Oral     SpO2 04/04/19 0706 94 %     Weight --      Height --      Head Circumference --      Peak Flow --      Pain Score --      Pain Loc --      Pain Edu? --      Excl. in [place]? --     Constitutional: Alert and oriented.  Uncomfortable appearing but in no acute distress. Eyes: Conjunctivae are normal.  Head: Atraumatic. Nose: No congestion/rhinnorhea. Mouth/Throat: Mucous membranes are somewhat dry.   Neck: Normal range of motion.  Cardiovascular: Tachycardic, regular rhythm. Grossly normal heart sounds.  Good peripheral circulation. Respiratory: Slightly increased respiratory effort.  No retractions. Lungs CTAB. Gastrointestinal: Soft and nontender. No distention.   Genitourinary: No flank tenderness. Musculoskeletal: No lower extremity edema.  Extremities warm and well perfused.  Right great toe with dressing in place but no surrounding erythema, induration, or abnormal warmth. Neurologic:  Normal speech and language.  Motor intact in all extremities.  No gross focal neurologic deficits are appreciated.  Skin:  Skin is warm and dry. No rash noted. Psychiatric: Anxious appearing.  ____________________________________________   LABS (all labs ordered are listed, but only abnormal results are displayed)  Labs Reviewed  COMPREHENSIVE METABOLIC PANEL - Abnormal; Notable for the following components:      Result Value   Potassium 2.8 (*)    Calcium 7.3 (*)  Total Protein 6.3 (*)    Albumin 2.6 (*)    AST 46 (*)    Alkaline Phosphatase 132 (*)    All other components within normal limits  CBC WITH DIFFERENTIAL/PLATELET - Abnormal; Notable for the following components:   WBC 13.8 (*)    RBC 3.28 (*)    Hemoglobin 11.4 (*)    HCT 33.2 (*)    MCV 101.2 (*)    MCH 34.8 (*)    Neutro Abs 10.9 (*)    Abs Immature Granulocytes 0.13 (*)    All other components within normal limits  URINALYSIS, COMPLETE (UACMP) WITH MICROSCOPIC - Abnormal; Notable for the following components:   Color, Urine AMBER (*)    APPearance HAZY (*)    Glucose, UA 50 (*)    Hgb urine dipstick SMALL (*)    Bilirubin Urine SMALL (*)    Ketones, ur 5 (*)    Protein, ur 30 (*)    All other components within normal limits  TROPONIN I - Abnormal; Notable for the following components:   Troponin I 0.07 (*)    All other components within normal limits  CULTURE, BLOOD (ROUTINE X 2)  CULTURE, BLOOD (ROUTINE X 2)  SARS CORONAVIRUS 2 (HOSPITAL ORDER, [facility])  ETHANOL  LACTIC ACID, PLASMA  LACTIC ACID, PLASMA   ____________________________________________  EKG  ED ECG REPORT I, Arta Silence, the attending physician, personally viewed and  interpreted this ECG.  Date: 04/04/2019 EKG Time: 07 11 Rate: 118 Rhythm: Sinus tachycardia QRS Axis: normal Intervals: normal ST/T Wave abnormalities: Nonspecific diffuse T wave abnormalities Narrative Interpretation: Nonspecific findings with new inferior and anterolateral T wave inversions when compared to EKG of 03/29/2019  ____________________________________________  RADIOLOGY  CXR: No focal infiltrate or other acute abnormality XR R foot: No findings to suggest osteomyelitis ____________________________________________   PROCEDURES  Procedure(s) performed: No  Procedures  Critical Care performed: No ____________________________________________   INITIAL IMPRESSION / ASSESSMENT AND PLAN / ED COURSE  Pertinent labs & imaging results that were available during my care of the patient were reviewed by me and considered in my medical decision making (see chart for details).  78 year old male with PMH as noted above including CAD, multiple myeloma, and alcohol abuse presents with generalized malaise and weakness as well as with shortness of breath over the last few days.  I reviewed the past medical records in [place].  The patient was just admitted to the hospital and discharged 2 days ago.  He was treated for sepsis and Haemophilus influenza bacteremia, as well as elevated troponin.  He had a right great toenail avulsion but no wound infection.  On exam today the patient is uncomfortable and anxious appearing but in no acute distress.  O2 saturation is in the high 90s on room air.  The remainder of the vital signs are normal except for tachycardia.  The exam is otherwise as described above. EKG shows new diffuse T wave inversions.   Differential includes recurrent/persistent bacteremia and/or sepsis, pneumonia, acute bronchitis, or cardiac etiology.  Differential also includes alcohol withdrawal, or AKA.  We will obtain labs including sepsis work-up, chest x-ray, give fluids,  and reassess.  ----------------------------------------- 3:41 PM on 04/04/2019 -----------------------------------------  UA and chest x-ray are negative, the patient's COVID swab is also negative.  Although he has no specific infection source, there are multiple factors suggestive of sepsis including increased WBC count and the patient's tachycardia when he came in.  I spoke to  his wife on the phone who stated that he did not drink any alcohol since before his recent admission to the hospital, so there is no evidence of alcohol withdrawal.  The patient also has a slightly elevated troponin increased from when he left the hospital a few days ago.  Based on these findings I think that there is a strong chance of recurrent bacteremia and/or sepsis.  The patient will need further inpatient monitoring and I ordered broad-spectrum antibiotics.  The patient's wife expressed a preference to have him transferred to the [place] for admission.  I contacted the transfer center and spoke to Dr. Leonia Corona who accepted the patient in transfer.  The patient is stable for transfer at this time.  ________________________________  Ryan Travis was evaluated in Emergency Department on 04/04/2019 for the symptoms described in the history of present illness. He was evaluated in the context of the global COVID-19 pandemic, which necessitated consideration that the patient might be at risk for infection with the SARS-CoV-2 virus that causes COVID-19. Institutional protocols and algorithms that pertain to the evaluation of patients at risk for COVID-19 are in a state of rapid change based on information released by regulatory bodies including the CDC and federal and state organizations. These policies and algorithms were followed during the patient's care in the ED.  ____________________________________________   FINAL CLINICAL IMPRESSION(S) / ED DIAGNOSES  Final diagnoses:  Sepsis without acute organ dysfunction, due to  unspecified organism ([place])  Elevated troponin      NEW MEDICATIONS STARTED DURING THIS VISIT:  New Prescriptions   No medications on file     Note:  This document was prepared using Dragon voice recognition software and may include unintentional dictation errors.    Arta Silence, MD 04/04/19 [phone]

## 2019-04-04 NOTE — ED Notes (Signed)
Lab called with troponin 0.07- will notify Dr Cherylann Banas

## 2019-04-06 LAB — CULTURE, BLOOD (ROUTINE X 2)
Culture: NO GROWTH
Culture: NO GROWTH
Special Requests: ADEQUATE

## 2019-04-09 LAB — CULTURE, BLOOD (ROUTINE X 2)
Culture: NO GROWTH
Culture: NO GROWTH
Special Requests: ADEQUATE
Special Requests: ADEQUATE

## 2019-06-30 LAB — CULTURE, BLOOD (ROUTINE X 2)

## 2019-07-02 ENCOUNTER — Inpatient Hospital Stay
Admission: EM | Admit: 2019-07-02 | Discharge: 2019-07-04 | DRG: 641 | Disposition: A | Discharge status: Home / Self Care | Payer: No Typology Code available for payment source | Attending: Internal Medicine | Admitting: Internal Medicine

## 2019-07-02 ENCOUNTER — Other Ambulatory Visit: Payer: Self-pay

## 2019-07-02 ENCOUNTER — Emergency Department: Payer: No Typology Code available for payment source

## 2019-07-02 DIAGNOSIS — M1990 Unspecified osteoarthritis, unspecified site: Secondary | ICD-10-CM | POA: Diagnosis present

## 2019-07-02 DIAGNOSIS — G2581 Restless legs syndrome: Secondary | ICD-10-CM | POA: Diagnosis present

## 2019-07-02 DIAGNOSIS — F17210 Nicotine dependence, cigarettes, uncomplicated: Secondary | ICD-10-CM | POA: Diagnosis present

## 2019-07-02 DIAGNOSIS — E872 Acidosis, unspecified: Secondary | ICD-10-CM

## 2019-07-02 DIAGNOSIS — K219 Gastro-esophageal reflux disease without esophagitis: Secondary | ICD-10-CM | POA: Diagnosis present

## 2019-07-02 DIAGNOSIS — F10229 Alcohol dependence with intoxication, unspecified: Secondary | ICD-10-CM | POA: Diagnosis present

## 2019-07-02 DIAGNOSIS — I10 Essential (primary) hypertension: Secondary | ICD-10-CM | POA: Diagnosis present

## 2019-07-02 DIAGNOSIS — C9000 Multiple myeloma not having achieved remission: Secondary | ICD-10-CM | POA: Diagnosis present

## 2019-07-02 DIAGNOSIS — Z8249 Family history of ischemic heart disease and other diseases of the circulatory system: Secondary | ICD-10-CM | POA: Diagnosis not present

## 2019-07-02 DIAGNOSIS — Z79891 Long term (current) use of opiate analgesic: Secondary | ICD-10-CM | POA: Diagnosis not present

## 2019-07-02 DIAGNOSIS — Z96643 Presence of artificial hip joint, bilateral: Secondary | ICD-10-CM | POA: Diagnosis present

## 2019-07-02 DIAGNOSIS — E8729 Other acidosis: Secondary | ICD-10-CM | POA: Diagnosis present

## 2019-07-02 DIAGNOSIS — F329 Major depressive disorder, single episode, unspecified: Secondary | ICD-10-CM | POA: Diagnosis present

## 2019-07-02 DIAGNOSIS — E785 Hyperlipidemia, unspecified: Secondary | ICD-10-CM | POA: Diagnosis present

## 2019-07-02 DIAGNOSIS — G8929 Other chronic pain: Secondary | ICD-10-CM | POA: Diagnosis present

## 2019-07-02 DIAGNOSIS — F10929 Alcohol use, unspecified with intoxication, unspecified: Secondary | ICD-10-CM

## 2019-07-02 DIAGNOSIS — Z20828 Contact with and (suspected) exposure to other viral communicable diseases: Secondary | ICD-10-CM | POA: Diagnosis present

## 2019-07-02 LAB — CBC
HCT: 36.3 % — ABNORMAL LOW (ref 39.0–52.0)
Hemoglobin: 12 g/dL — ABNORMAL LOW (ref 13.0–17.0)
MCH: 31.3 pg (ref 26.0–34.0)
MCHC: 33.1 g/dL (ref 30.0–36.0)
MCV: 94.5 fL (ref 80.0–100.0)
Platelets: 265 10*3/uL (ref 150–400)
RBC: 3.84 MIL/uL — ABNORMAL LOW (ref 4.22–5.81)
RDW: 13.4 % (ref 11.5–15.5)
WBC: 10.5 10*3/uL (ref 4.0–10.5)
nRBC: 0 % (ref 0.0–0.2)

## 2019-07-02 LAB — SARS CORONAVIRUS 2 BY RT PCR (HOSPITAL ORDER, PERFORMED IN ~~LOC~~ HOSPITAL LAB): SARS Coronavirus 2: NEGATIVE

## 2019-07-02 LAB — BASIC METABOLIC PANEL
Anion gap: 20 — ABNORMAL HIGH (ref 5–15)
BUN: 24 mg/dL — ABNORMAL HIGH (ref 8–23)
CO2: 18 mmol/L — ABNORMAL LOW (ref 22–32)
Calcium: 8.8 mg/dL — ABNORMAL LOW (ref 8.9–10.3)
Chloride: 103 mmol/L (ref 98–111)
Creatinine, Ser: 1.21 mg/dL (ref 0.61–1.24)
GFR calc Af Amer: >60 mL/min (ref >60)
GFR calc non Af Amer: 58 mL/min — ABNORMAL LOW (ref >60)
Glucose, Bld: 53 mg/dL — ABNORMAL LOW (ref 70–99)
Potassium: 4.4 mmol/L (ref 3.5–5.1)
Sodium: 141 mmol/L (ref 135–145)

## 2019-07-02 LAB — BETA-HYDROXYBUTYRIC ACID: Beta-Hydroxybutyric Acid: 2.18 mmol/L — ABNORMAL HIGH (ref 0.05–0.27)

## 2019-07-02 LAB — HEPATIC FUNCTION PANEL
ALT: 15 U/L (ref 0–44)
AST: 43 U/L — ABNORMAL HIGH (ref 15–41)
Albumin: 3.8 g/dL (ref 3.5–5.0)
Alkaline Phosphatase: 92 U/L (ref 38–126)
Bilirubin, Direct: 0.2 mg/dL (ref 0.0–0.2)
Indirect Bilirubin: 0.7 mg/dL (ref 0.3–0.9)
Total Bilirubin: 0.9 mg/dL (ref 0.3–1.2)
Total Protein: 7.4 g/dL (ref 6.5–8.1)

## 2019-07-02 LAB — LACTIC ACID, PLASMA
Lactic Acid, Venous: 8.4 mmol/L (ref 0.5–1.9)
Lactic Acid, Venous: 6.1 mmol/L (ref 0.5–1.9)

## 2019-07-02 LAB — CK: Total CK: 94 U/L (ref 49–397)

## 2019-07-02 LAB — MAGNESIUM: Magnesium: 1.9 mg/dL (ref 1.7–2.4)

## 2019-07-02 LAB — GLUCOSE, CAPILLARY: Glucose-Capillary: 74 mg/dL (ref 70–99)

## 2019-07-02 LAB — TSH: TSH: 1.542 u[IU]/mL (ref 0.350–4.500)

## 2019-07-02 LAB — ETHANOL: Alcohol, Ethyl (B): 192 mg/dL — ABNORMAL HIGH (ref <10)

## 2019-07-02 MED ORDER — SERTRALINE HCL 50 MG PO TABS
100.0000 mg | ORAL_TABLET | Freq: Every day | ORAL | Status: DC
  Administered 2019-07-03 (×2): 100 mg via ORAL
  Filled 2019-07-02 (×2): qty 2

## 2019-07-02 MED ORDER — ONDANSETRON HCL 4 MG PO TABS: 4.0000 mg | ORAL_TABLET | Freq: Four times a day (QID) | ORAL | Status: DC | PRN

## 2019-07-02 MED ORDER — LACTATED RINGERS IV BOLUS
1000.0000 mL | Freq: Once | INTRAVENOUS | Status: AC
  Administered 2019-07-02: 19:00:00 1000 mL via INTRAVENOUS

## 2019-07-02 MED ORDER — LORAZEPAM 1 MG PO TABS: 1.0000 mg | ORAL_TABLET | Freq: Four times a day (QID) | ORAL | Status: DC | PRN

## 2019-07-02 MED ORDER — GABAPENTIN 100 MG PO CAPS
200.0000 mg | ORAL_CAPSULE | Freq: Every day | ORAL | Status: DC
  Administered 2019-07-03 (×2): 200 mg via ORAL
  Filled 2019-07-02 (×2): qty 2

## 2019-07-02 MED ORDER — LACTATED RINGERS IV BOLUS
1000.0000 mL | Freq: Once | INTRAVENOUS | Status: AC
  Administered 2019-07-02: 1000 mL via INTRAVENOUS

## 2019-07-02 MED ORDER — FOLIC ACID 1 MG PO TABS: 1.0000 mg | ORAL_TABLET | Freq: Every day | ORAL | Status: DC

## 2019-07-02 MED ORDER — LOSARTAN POTASSIUM 25 MG PO TABS
25.0000 mg | ORAL_TABLET | Freq: Every day | ORAL | Status: DC
  Administered 2019-07-03 – 2019-07-04 (×2): 25 mg via ORAL
  Filled 2019-07-02 (×2): qty 1

## 2019-07-02 MED ORDER — LORAZEPAM 2 MG/ML IJ SOLN: 1.0000 mg | Freq: Four times a day (QID) | INTRAMUSCULAR | Status: DC | PRN

## 2019-07-02 MED ORDER — ENOXAPARIN SODIUM 40 MG/0.4ML ~~LOC~~ SOLN
40.0000 mg | SUBCUTANEOUS | Status: DC
  Administered 2019-07-03 (×2): 40 mg via SUBCUTANEOUS
  Filled 2019-07-02 (×2): qty 0.4

## 2019-07-02 MED ORDER — TRAZODONE HCL 50 MG PO TABS
50.0000 mg | ORAL_TABLET | Freq: Every evening | ORAL | Status: DC | PRN
  Administered 2019-07-03 (×2): 50 mg via ORAL
  Filled 2019-07-02 (×2): qty 1

## 2019-07-02 MED ORDER — VITAMIN B-1 100 MG PO TABS
100.0000 mg | ORAL_TABLET | Freq: Every day | ORAL | Status: DC
  Administered 2019-07-04: 100 mg via ORAL
  Filled 2019-07-02: qty 1

## 2019-07-02 MED ORDER — ADULT MULTIVITAMIN W/MINERALS CH
1.0000 | ORAL_TABLET | Freq: Every day | ORAL | Status: DC
  Administered 2019-07-03 – 2019-07-04 (×2): 1 via ORAL
  Filled 2019-07-02 (×2): qty 1

## 2019-07-02 MED ORDER — ADULT MULTIVITAMIN W/MINERALS CH: 1.0000 | ORAL_TABLET | Freq: Every day | ORAL | Status: DC

## 2019-07-02 MED ORDER — THIAMINE HCL 100 MG/ML IJ SOLN
Freq: Once | INTRAVENOUS | Status: AC
  Administered 2019-07-03: 03:00:00 via INTRAVENOUS
  Filled 2019-07-02: qty 1000

## 2019-07-02 MED ORDER — SIMVASTATIN 20 MG PO TABS
40.0000 mg | ORAL_TABLET | Freq: Every day | ORAL | Status: DC
  Administered 2019-07-03 (×2): 40 mg via ORAL
  Filled 2019-07-02 (×2): qty 2

## 2019-07-02 MED ORDER — ONDANSETRON HCL 4 MG/2ML IJ SOLN: 4.0000 mg | Freq: Four times a day (QID) | INTRAMUSCULAR | Status: DC | PRN

## 2019-07-02 MED ORDER — VITAMIN B-1 100 MG PO TABS: 100.0000 mg | ORAL_TABLET | Freq: Every day | ORAL | Status: DC

## 2019-07-02 MED ORDER — FOLIC ACID 1 MG PO TABS
1.0000 mg | ORAL_TABLET | Freq: Every day | ORAL | Status: DC
  Administered 2019-07-03 – 2019-07-04 (×2): 1 mg via ORAL
  Filled 2019-07-02 (×2): qty 1

## 2019-07-02 MED ORDER — PANTOPRAZOLE SODIUM 40 MG PO TBEC
40.0000 mg | DELAYED_RELEASE_TABLET | Freq: Every day | ORAL | Status: DC
  Administered 2019-07-03 – 2019-07-04 (×2): 40 mg via ORAL
  Filled 2019-07-02 (×2): qty 1

## 2019-07-02 MED ORDER — CARVEDILOL 3.125 MG PO TABS
6.2500 mg | ORAL_TABLET | Freq: Two times a day (BID) | ORAL | Status: DC
  Administered 2019-07-03 – 2019-07-04 (×4): 6.25 mg via ORAL
  Filled 2019-07-02 (×4): qty 2

## 2019-07-02 MED ORDER — THIAMINE HCL 100 MG/ML IJ SOLN
100.0000 mg | Freq: Every day | INTRAMUSCULAR | Status: DC
  Administered 2019-07-03 – 2019-07-04 (×2): 100 mg via INTRAVENOUS
  Filled 2019-07-02 (×2): qty 2

## 2019-07-02 NOTE — H&P (Signed)
[facility] NAME: Ryan Travis    MR#:  [id number]  DATE OF BIRTH:  04/01/1941  DATE OF ADMISSION:  07/02/2019  PRIMARY CARE PHYSICIAN: [facility]   REQUESTING/REFERRING PHYSICIAN: Blake Divine, MD  CHIEF COMPLAINT:   Chief Complaint  Patient presents with   Fall    HISTORY OF PRESENT ILLNESS:  Jaxyn Rout  is a 78 y.o. male with a known history of alcohol abuse, multiple myeloma, hypertension, chronic pain.  He presented to the emergency room after a fall during intimacy with his wife.  He had complained of a headache earlier upon arrival.  However, he denies pain currently.  He denies other complaints.  He reports he and his wife had been drinking consistently over the last 5 to 6 days drinking 2-3 fifths of [place] alcohol.  He reports a long-term history of alcoholism however had stopped drinking several months ago until 5 days ago.  He denies fevers, chills, nausea, vomiting, diarrhea.  He denies abdominal pain or chest pain.  He denies shortness of breath.  Labs on arrival with anion gap of 20.  Creatinine 1.21 and BUN 24.  Lactic acid is 8.4.  Glucose is 53 on arrival.  Alcohol level is 192.  Rapid COVID-19 testing is negative CT head and cervical spine demonstrate no acute abnormalities.  He has been admitted to the hospitalist service for further observation and evaluation.  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[place])    Cancer ([place])    multiple myeloma   Depression    Dysrhythmia    Fall    3 weeks ago/ torn ligaments left knee/wearing brace/pt   Hypertension    Neuropathy    Pain    chronic leg and ankle pain   RLS (restless legs syndrome)     PAST SURGICAL HISTORY:   Past Surgical History:  Procedure Laterality Date   BRAIN SURGERY     subdural hematoma   CARDIAC  CATHETERIZATION N/A 10/16/2016   Procedure: Left Heart Cath and Coronary Angiography;  Surgeon: Nelva Bush, MD;  Location: [place] [facility];  Service: Cardiovascular;  Laterality: N/A;   CARDIAC CATHETERIZATION N/A 10/16/2016   Procedure: Coronary Stent Intervention;  Surgeon: Nelva Bush, MD;  Location: [place] [facility];  Service: Cardiovascular;  Laterality: N/A;   CARDIAC CATHETERIZATION N/A 10/16/2016   Procedure: Temporary Pacemaker;  Surgeon: Nelva Bush, MD;  Location: [place] [facility];  Service: Cardiovascular;  Laterality: N/A;   CATARACT EXTRACTION W/PHACO Right 08/16/2016   Procedure: CATARACT EXTRACTION PHACO AND INTRAOCULAR LENS PLACEMENT (IOC);  Surgeon: Eulogio Bear, MD;  Location: [facility];  Service: Ophthalmology;  Laterality: Right;  Lot # [id number] H [place]: 01:00.8 AP%:16.5 CDE: 10.02    CATARACT EXTRACTION W/PHACO Left 10/04/2016   Procedure: CATARACT EXTRACTION PHACO AND INTRAOCULAR LENS PLACEMENT (IOC);  Surgeon: Eulogio Bear, MD;  Location: [facility];  Service: Ophthalmology;  Laterality: Left;  [place] 35.4 AP% 10.5 CDE 4.11 Fluid pack lot # [id number]   EYE SURGERY     FRACTURE SURGERY     multiple right ankle   HERNIA REPAIR     JOINT REPLACEMENT Bilateral    thr   KNEE ARTHROSCOPY W/ ACL RECONSTRUCTION     left x 2   rcr  SOCIAL HISTORY:   Social History   Tobacco Use   Smoking status: Current Every Day Smoker    Packs/day: 0.30    Years: 20.00    Pack years: 6.00    Types: Cigarettes   Smokeless tobacco: Never Used  Substance Use Topics   Alcohol use: Yes    Frequency: Never    Comment: 2-3 beers/day or wine    FAMILY HISTORY:   Family History  Problem Relation Age of Onset   Heart attack Mother    Heart attack Father     DRUG ALLERGIES:   Allergies  Allergen Reactions   Azithromycin Other (See Comments)    Other reaction(s): Unknown    Morphine And Related    Neomycin    Other      METHIOLATE   Penicillins     Did it involve swelling of the face/tongue/throat, SOB, or low BP? Yes Did it involve sudden or severe rash/hives, skin peeling, or any reaction on the inside of your mouth or nose? Yes Did you need to seek medical attention at a hospital or doctor's office? Yes When did it last happen? 10 years If all above answers are NO, may proceed with cephalosporin use.    REVIEW OF SYSTEMS:   Review of Systems  Constitutional: Negative for chills, fever and malaise/fatigue.  HENT: Negative for congestion and sore throat.   Eyes: Negative for blurred vision and double vision.  Respiratory: Negative for cough, shortness of breath and wheezing.   Cardiovascular: Negative for chest pain, palpitations and leg swelling.  Gastrointestinal: Negative for abdominal pain, blood in stool, constipation, diarrhea, heartburn, nausea and vomiting.  Genitourinary: Negative for dysuria, flank pain and hematuria.  Musculoskeletal: Positive for falls. Negative for myalgias.  Skin: Negative for itching and rash.  Neurological: Negative for dizziness, weakness and headaches.  Psychiatric/Behavioral: Positive for substance abuse (ETOH history). Negative for depression.    MEDICATIONS AT HOME:   Prior to Admission medications   Medication Sig Start Date End Date Taking? Authorizing Provider  carvedilol (COREG) 6.25 MG tablet Take 1 tablet (6.25 mg total) by mouth 2 (two) times daily with a meal. 04/02/19  Yes Salary, Montell D, MD  folic acid (FOLVITE) 1 MG tablet Take 1 mg by mouth daily.   Yes [provider]  gabapentin (NEURONTIN) 100 MG capsule Take 2 capsules (200 mg total) by mouth at bedtime. 04/02/19  Yes Salary, Avel Peace, MD  HYDROcodone-acetaminophen (NORCO) 10-325 MG tablet Take 2 tablets by mouth daily.    Yes [provider]  losartan (COZAAR) 25 MG tablet Take 25 mg by mouth daily.   Yes [provider]  omeprazole (PRILOSEC) 20 MG capsule Take  20 mg by mouth daily.   Yes [provider]  sertraline (ZOLOFT) 100 MG tablet Take 1 tablet (100 mg total) by mouth at bedtime. 04/02/19  Yes Salary, Avel Peace, MD  simvastatin (ZOCOR) 40 MG tablet Take 40 mg by mouth at bedtime.   Yes [provider]      VITAL SIGNS:  Blood pressure (!) 145/96, pulse (!) 101, temperature 98.7 F (37.1 C), temperature source Oral, resp. rate 16, height '6\' 2"'  (1.88 m), weight 81.6 kg, SpO2 (!) 81 %.  PHYSICAL EXAMINATION:  Physical Exam  GENERAL:  78 y.o.-year-old patient lying in the bed with no acute distress.  EYES: Pupils equal, round, reactive to light and accommodation. No scleral icterus. Extraocular muscles intact.  HEENT: Head atraumatic, normocephalic. Oropharynx and nasopharynx clear.  NECK:  Supple, no jugular venous distention. No thyroid enlargement, no tenderness.  LUNGS: Normal breath sounds bilaterally, no wheezing, rales,rhonchi or crepitation. No use of accessory muscles of respiration.  CARDIOVASCULAR: Regular rate and rhythm, S1, S2 normal. No murmurs, rubs, or gallops.  ABDOMEN: Soft, nondistended, nontender. Bowel sounds present. No organomegaly or mass.  EXTREMITIES: No pedal edema, cyanosis, or clubbing.  NEUROLOGIC: Cranial nerves II through XII are intact. Muscle strength 5/5 in all extremities. Sensation intact. Gait not checked.  PSYCHIATRIC: The patient is alert and oriented x 3.  Normal affect and good eye contact. SKIN: No obvious rash, lesion, or ulcer.   LABORATORY PANEL:   CBC Recent Labs  Lab 07/02/19 1505  WBC 10.5  HGB 12.0*  HCT 36.3*  PLT 265   ------------------------------------------------------------------------------------------------------------------  Chemistries  Recent Labs  Lab 07/02/19 1505 07/02/19 1748  NA 141  --   K 4.4  --   CL 103  --   CO2 18*  --   GLUCOSE 53*  --   BUN 24*  --   CREATININE 1.21  --   CALCIUM 8.8*  --   MG  --  1.9    ------------------------------------------------------------------------------------------------------------------  Cardiac Enzymes No results for input(s): TROPONINI in the last 168 hours. ------------------------------------------------------------------------------------------------------------------  RADIOLOGY:  Ct Head Wo Contrast  Result Date: 07/02/2019 CLINICAL DATA:  [place] from bed, positive head strike EXAM: CT HEAD WITHOUT CONTRAST CT CERVICAL SPINE WITHOUT CONTRAST TECHNIQUE: Multidetector CT imaging of the head and cervical spine was performed following the standard protocol without intravenous contrast. Multiplanar CT image reconstructions of the cervical spine were also generated. 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 No evidence of acute infarction, hemorrhage, hydrocephalus, extra-axial collection or mass lesion/mass effect. Symmetric prominence of the ventricles, cisterns and sulci compatible with parenchymal volume loss. Patchy areas of white matter hypoattenuation are most compatible with chronic microvascular angiopathy. Vascular: Atherosclerotic calcification of the carotid siphons and intradural vertebral arteries. Skull: Remote left frontal and parietal burr holes. No calvarial fracture or suspicious osseous lesion. No scalp swelling or hematoma. Sinuses/Orbits: Paranasal sinuses and mastoid air cells are predominantly clear. Orbital structures are unremarkable aside from prior lens extractions. Other: None CT CERVICAL SPINE FINDINGS Alignment: Degenerative reversal of the normal cervical lordosis centered at C5 with degenerative stepwise anterolisthesis C2-C5 and retrolisthesis C5 to C7. craniocervical and atlantoaxial alignment is maintained. Small os odontoideum, likely degenerative in nature. Skull base and vertebrae: Subcortical cystic changes are present in the lateral  mass of C1 which are likely related to extensive arthrosis noted at the craniocervical articulation and atlantoaxial articulations. No particularly aggressive features. No visible cervical spine fracture is evident. Mild vertebral body height loss in the C5 and C6 levels is likely on a degenerative basis. Soft tissues and spinal canal: No pre or paravertebral fluid or swelling. No visible canal hematoma. Partially calcified pannus formation posterior to the dens. Disc levels: Multilevel cervical spondylitic changes are present with several posterior disc osteophyte complexes findings result in mild canal stenosis at C4-5, C5-6. Uncinate spurring and facet hypertrophic changes result in moderate foraminal narrowing on the right at C4-5 and C5-6, on the left at C3-4, and bilaterally C6-7. Upper chest: No acute abnormality in the upper chest or imaged lung apices. Other: Atherosclerotic plaque within the great vessels and cervical carotid arteries. IMPRESSION: 1. No acute intracranial abnormality. 2. Remote right PICA distribution infarct. 3. Chronic microvascular  ischemic changes, volume loss and atherosclerosis. 4. No acute cervical spine fracture. 5. Extensive cervical spondylitic changes result in at most mild canal stenosis both mild-to-moderate multilevel neural foraminal narrowing, detailed above. 6. Subcortical cystic changes the lateral mass of C1 are favored to be degenerative in nature. Electronically Signed   By: Lovena Le M.D.   On: 07/02/2019 15:36   Ct Cervical Spine Wo Contrast  Result Date: 07/02/2019 CLINICAL DATA:  [place] from bed, positive head strike EXAM: CT HEAD WITHOUT CONTRAST CT CERVICAL SPINE WITHOUT CONTRAST TECHNIQUE: Multidetector CT imaging of the head and cervical spine was performed following the standard protocol without intravenous contrast. Multiplanar CT image reconstructions of the cervical spine were also generated. COMPARISON:  CT head 01/09/2019, 06/17/2018 FINDINGS: CT  HEAD FINDINGS Brain: Gliosis related to remote right PICA infarct, similar to prior. No evidence of acute infarction, hemorrhage, hydrocephalus, extra-axial collection or mass lesion/mass effect. Symmetric prominence of the ventricles, cisterns and sulci compatible with parenchymal volume loss. Patchy areas of white matter hypoattenuation are most compatible with chronic microvascular angiopathy. Vascular: Atherosclerotic calcification of the carotid siphons and intradural vertebral arteries. Skull: Remote left frontal and parietal burr holes. No calvarial fracture or suspicious osseous lesion. No scalp swelling or hematoma. Sinuses/Orbits: Paranasal sinuses and mastoid air cells are predominantly clear. Orbital structures are unremarkable aside from prior lens extractions. Other: None CT CERVICAL SPINE FINDINGS Alignment: Degenerative reversal of the normal cervical lordosis centered at C5 with degenerative stepwise anterolisthesis C2-C5 and retrolisthesis C5 to C7. craniocervical and atlantoaxial alignment is maintained. Small os odontoideum, likely degenerative in nature. Skull base and vertebrae: Subcortical cystic changes are present in the lateral mass of C1 which are likely related to extensive arthrosis noted at the craniocervical articulation and atlantoaxial articulations. No particularly aggressive features. No visible cervical spine fracture is evident. Mild vertebral body height loss in the C5 and C6 levels is likely on a degenerative basis. Soft tissues and spinal canal: No pre or paravertebral fluid or swelling. No visible canal hematoma. Partially calcified pannus formation posterior to the dens. Disc levels: Multilevel cervical spondylitic changes are present with several posterior disc osteophyte complexes findings result in mild canal stenosis at C4-5, C5-6. Uncinate spurring and facet hypertrophic changes result in moderate foraminal narrowing on the right at C4-5 and C5-6, on the left at C3-4,  and bilaterally C6-7. Upper chest: No acute abnormality in the upper chest or imaged lung apices. Other: Atherosclerotic plaque within the great vessels and cervical carotid arteries. IMPRESSION: 1. No acute intracranial abnormality. 2. Remote right PICA distribution infarct. 3. Chronic microvascular ischemic changes, volume loss and atherosclerosis. 4. No acute cervical spine fracture. 5. Extensive cervical spondylitic changes result in at most mild canal stenosis both mild-to-moderate multilevel neural foraminal narrowing, detailed above. 6. Subcortical cystic changes the lateral mass of C1 are favored to be degenerative in nature. Electronically Signed   By: Lovena Le M.D.   On: 07/02/2019 15:36      IMPRESSION AND PLAN:   1.  Alcoholic ketoacidosis - Patient received 2 L lactated Ringer's in the emergency room.  Currently receiving banana bag. - We will repeat lactic acid level in the a.m. -Repeat BMP in the a.m.  2.  EtOH abuse - CIWA protocol  3.  GERD - Protonix continued  4.  Depression - Zoloft continued  5.  Hyperlipidemia -Zocor continued  DVT and PPI prophylaxis    All the records are reviewed and case discussed with ED provider. The plan  of care was discussed in details with the patient (and family). I answered all questions. The patient agreed to proceed with the above mentioned plan. Further management will depend upon hospital course.   CODE STATUS: Full code  TOTAL TIME TAKING CARE OF THIS PATIENT: 45 minutes.    [place] on 07/02/2019 at 7:36 PM  Pager - [phone]  After 6pm go to [email or web address] - Proofreader  [facility]  Office  [phone]  CC: Primary care physician; [place]   Note: This dictation was prepared with Dragon dictation along with smaller phrase technology. Any transcriptional errors that result from this process are unintentional.

## 2019-07-02 NOTE — ED Notes (Signed)
ED TO INPATIENT HANDOFF REPORT  ED Nurse Name and Phone #: Mac [phone]  S Name/Age/Gender Ryan Travis 78 y.o. male Room/Bed: ED12A/ED12A  Code Status   Code Status: Prior  Home/SNF/Other Home Patient oriented to: self, place, time and situation Is this baseline? Yes   Triage Complete: Triage complete  Chief Complaint fall  Triage Note Pt comes into the ED via EMS from home, states pt fell out of bed with wife while having intimate moment, pt states he did hit his head, and is c/o head and neck pain states they were both too weak to get up out of the floor and been there all night. Pt is a/ox4 on arrival.   Allergies Allergies  Allergen Reactions  . Azithromycin Other (See Comments)    Other reaction(s): Unknown   . Morphine And Related   . Neomycin   . Other     METHIOLATE  . Penicillins     Did it involve swelling of the face/tongue/throat, SOB, or low BP? Yes Did it involve sudden or severe rash/hives, skin peeling, or any reaction on the inside of your mouth or nose? Yes Did you need to seek medical attention at a hospital or doctor's office? Yes When did it last happen? 10 years If all above answers are "NO", may proceed with cephalosporin use.    Level of Care/Admitting Diagnosis ED Disposition    ED Disposition Condition [facility] Area: [place] [[id number]]  Level of Care: Med-Surg [16]  Covid Evaluation: Asymptomatic Screening Protocol (No Symptoms)  Diagnosis: Alcoholic ketoacidosis [102585]  Admitting Physician: Demetrios Loll [[id number]]  Attending Physician: Demetrios Loll [[id number]]  Estimated length of stay: past midnight tomorrow  Certification:: I certify this patient will need inpatient services for at least 2 midnights  PT Class (Do Not Modify): Inpatient [101]  PT Acc Code (Do Not Modify): Private [1]       B Medical/Surgery History Past Medical History:  Diagnosis Date  . Alcohol abuse   . Anxiety   .  Arthritis   . 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)   . Cancer ([place])    multiple myeloma  . Depression   . Dysrhythmia   . Fall    3 weeks ago/ torn ligaments left knee/wearing brace/pt  . Hypertension   . Neuropathy   . Pain    chronic leg and ankle pain  . RLS (restless legs syndrome)    Past Surgical History:  Procedure Laterality Date  . BRAIN SURGERY     subdural hematoma  . CARDIAC CATHETERIZATION N/A 10/16/2016   Procedure: Left Heart Cath and Coronary Angiography;  Surgeon: Nelva Bush, MD;  Location: [place] [facility];  Service: Cardiovascular;  Laterality: N/A;  . CARDIAC CATHETERIZATION N/A 10/16/2016   Procedure: Coronary Stent Intervention;  Surgeon: Nelva Bush, MD;  Location: [place] [facility];  Service: Cardiovascular;  Laterality: N/A;  . CARDIAC CATHETERIZATION N/A 10/16/2016   Procedure: Temporary Pacemaker;  Surgeon: Nelva Bush, MD;  Location: [place] [facility];  Service: Cardiovascular;  Laterality: N/A;  . CATARACT EXTRACTION W/PHACO Right 08/16/2016   Procedure: CATARACT EXTRACTION PHACO AND INTRAOCULAR LENS PLACEMENT (IOC);  Surgeon: Eulogio Bear, MD;  Location: [facility];  Service: Ophthalmology;  Laterality: Right;  Lot # [id number] H [place]: 01:00.8 AP%:16.5 CDE: 10.02   . CATARACT EXTRACTION W/PHACO Left 10/04/2016   Procedure: CATARACT EXTRACTION PHACO AND INTRAOCULAR LENS PLACEMENT (IOC);  Surgeon: Eulogio Bear, MD;  Location: [facility];  Service: Ophthalmology;  Laterality: Left;  [place] 35.4 AP% 10.5 CDE 4.11 Fluid pack lot # [id number]  . EYE SURGERY    . FRACTURE SURGERY     multiple right ankle  . HERNIA REPAIR    . JOINT REPLACEMENT Bilateral    thr  . KNEE ARTHROSCOPY W/ ACL RECONSTRUCTION     left x 2  . rcr       A IV Location/Drains/Wounds Patient Lines/Drains/Airways Status   Active Line/Drains/Airways    Name:   Placement date:   Placement time:   Site:   Days:   Peripheral IV 04/04/19 Right Forearm   04/04/19    0729     Forearm   89   Peripheral IV 04/04/19 Left Forearm   04/04/19    0740    Forearm   89   Peripheral IV 07/02/19 Left Antecubital   07/02/19    1746    Antecubital   less than 1   Incision (Closed) 10/04/16 Eye Left   10/04/16    0958     1001   Pressure Injury 03/29/19 Stage II -  Partial thickness loss of dermis presenting as a shallow open ulcer with a red, pink wound bed without slough.   03/29/19    1923     95   Wound / Incision (Open or Dehisced) 03/29/19 Other (Comment) Toe (Comment  which one) Right   03/29/19    1918    Toe (Comment  which one)   95          Intake/Output Last 24 hours No intake or output data in the 24 hours ending 07/02/19 2032  Labs/Imaging Results for orders placed or performed during the hospital encounter of 07/02/19 (from the past 48 hour(s))  Basic metabolic panel     Status: Abnormal   Collection Time: 07/02/19  3:05 PM  Result Value Ref Range   Sodium 141 135 - 145 mmol/L    Comment: LYTES REPEATED MJU   Potassium 4.4 3.5 - 5.1 mmol/L   Chloride 103 98 - 111 mmol/L   CO2 18 (L) 22 - 32 mmol/L   Glucose, Bld 53 (L) 70 - 99 mg/dL   BUN 24 (H) 8 - 23 mg/dL   Creatinine, Ser 1.21 0.61 - 1.24 mg/dL   Calcium 8.8 (L) 8.9 - 10.3 mg/dL   GFR calc non Af Amer 58 (L) >60 mL/min   GFR calc Af Amer >60 >60 mL/min   Anion gap 20 (H) 5 - 15    Comment: Performed at [facility], [address] [place], [place] [address]  CBC     Status: Abnormal   Collection Time: 07/02/19  3:05 PM  Result Value Ref Range   WBC 10.5 4.0 - 10.5 K/uL   RBC 3.84 (L) 4.22 - 5.81 MIL/uL   Hemoglobin 12.0 (L) 13.0 - 17.0 g/dL   HCT 36.3 (L) 39.0 - 52.0 %   MCV 94.5 80.0 - 100.0 fL   MCH 31.3 26.0 - 34.0 pg   MCHC 33.1 30.0 - 36.0 g/dL   RDW 13.4 11.5 - 15.5 %   Platelets 265 150 - 400 K/uL   nRBC 0.0 0.0 - 0.2 %    Comment: Performed at [facility], [address] [place], [place] [address]  CK     Status: None   Collection Time: 07/02/19  3:05 PM   Result Value Ref Range   Total CK 94 49 - 397 U/L  Comment: Performed at [facility], [address] [place], [place] [address]  Glucose, capillary     Status: None   Collection Time: 07/02/19  5:33 PM  Result Value Ref Range   Glucose-Capillary 74 70 - 99 mg/dL  Magnesium     Status: None   Collection Time: 07/02/19  5:48 PM  Result Value Ref Range   Magnesium 1.9 1.7 - 2.4 mg/dL    Comment: Performed at [facility], [address] [place], [place] [address]  Ethanol     Status: Abnormal   Collection Time: 07/02/19  5:48 PM  Result Value Ref Range   Alcohol, Ethyl (B) 192 (H) <10 mg/dL    Comment: (NOTE) Lowest detectable limit for serum alcohol is 10 mg/dL. For medical purposes only. Performed at [facility], [address] [place], [place] [address]   Beta-hydroxybutyric acid     Status: Abnormal   Collection Time: 07/02/19  5:48 PM  Result Value Ref Range   Beta-Hydroxybutyric Acid 2.18 (H) 0.05 - 0.27 mmol/L    Comment: Performed at [facility], [address] [place], [place] [address]  Lactic acid, plasma     Status: Abnormal   Collection Time: 07/02/19  6:27 PM  Result Value Ref Range   Lactic Acid, Venous 8.4 (HH) 0.5 - 1.9 mmol/L    Comment: CRITICAL RESULT CALLED TO, READ BACK BY AND VERIFIED WITH MAC Idrissa Beville _0  07/02/19 MJU Performed at [facility], [address] [place], [place] [address]   SARS Coronavirus 2 [facility] order, Performed in [place] hospital lab)     Status: None   Collection Time: 07/02/19  7:20 PM  Result Value Ref Range   SARS Coronavirus 2 NEGATIVE NEGATIVE    Comment: (NOTE) If result is NEGATIVE SARS-CoV-2 target nucleic acids are NOT DETECTED. The SARS-CoV-2 RNA is generally detectable in upper and lower  respiratory specimens during the acute phase of infection. The lowest  concentration of SARS-CoV-2 viral copies this assay can detect is 250  copies / mL. A negative  result does not preclude SARS-CoV-2 infection  and should not be used as the sole basis for treatment or other  patient management decisions.  A negative result may occur with  improper specimen collection / handling, submission of specimen other  than nasopharyngeal swab, presence of viral mutation(s) within the  areas targeted by this assay, and inadequate number of viral copies  (<250 copies / mL). A negative result must be combined with clinical  observations, patient history, and epidemiological information. If result is POSITIVE SARS-CoV-2 target nucleic acids are DETECTED. The SARS-CoV-2 RNA is generally detectable in upper and lower  respiratory specimens dur ing the acute phase of infection.  Positive  results are indicative of active infection with SARS-CoV-2.  Clinical  correlation with patient history and other diagnostic information is  necessary to determine patient infection status.  Positive results do  not rule out bacterial infection or co-infection with other viruses. If result is PRESUMPTIVE POSTIVE SARS-CoV-2 nucleic acids MAY BE PRESENT.   A presumptive positive result was obtained on the submitted specimen  and confirmed on repeat testing.  While 2019 novel coronavirus  (SARS-CoV-2) nucleic acids may be present in the submitted sample  additional confirmatory testing may be necessary for epidemiological  and / or clinical management purposes  to differentiate between  SARS-CoV-2 and other Sarbecovirus currently known to infect humans.  If clinically indicated additional testing with an alternate test  methodology [phone]) is advised. The SARS-CoV-2 RNA is generally  detectable in upper and lower respiratory sp ecimens during the acute  phase of infection. The expected result is Negative. Fact Sheet for Patients:  StrictlyIdeas.no Fact Sheet for Healthcare Providers: BankingDealers.co.za This test is not yet  approved or cleared by the [place] FDA and has been authorized for detection and/or diagnosis of SARS-CoV-2 by FDA under an Emergency Use Authorization (EUA).  This EUA will remain in effect (meaning this test can be used) for the duration of the COVID-19 declaration under Section 564(b)(1) of the Act, 21 U.S.C. section 360bbb-3(b)(1), unless the authorization is terminated or revoked sooner. Performed at [facility], [place], [place] [address]    Ct Head Wo Contrast  Result Date: 07/02/2019 CLINICAL DATA:  [place] from bed, positive head strike EXAM: CT HEAD WITHOUT CONTRAST CT CERVICAL SPINE WITHOUT CONTRAST TECHNIQUE: Multidetector CT imaging of the head and cervical spine was performed following the standard protocol without intravenous contrast. Multiplanar CT image reconstructions of the cervical spine were also generated. 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 No evidence of acute infarction, hemorrhage, hydrocephalus, extra-axial collection or mass lesion/mass effect. Symmetric prominence of the ventricles, cisterns and sulci compatible with parenchymal volume loss. Patchy areas of white matter hypoattenuation are most compatible with chronic microvascular angiopathy. Vascular: Atherosclerotic calcification of the carotid siphons and intradural vertebral arteries. Skull: Remote left frontal and parietal burr holes. No calvarial fracture or suspicious osseous lesion. No scalp swelling or hematoma. Sinuses/Orbits: Paranasal sinuses and mastoid air cells are predominantly clear. Orbital structures are unremarkable aside from prior lens extractions. Other: None CT CERVICAL SPINE FINDINGS Alignment: Degenerative reversal of the normal cervical lordosis centered at C5 with degenerative stepwise anterolisthesis C2-C5 and retrolisthesis C5 to C7. craniocervical and  atlantoaxial alignment is maintained. Small os odontoideum, likely degenerative in nature. Skull base and vertebrae: Subcortical cystic changes are present in the lateral mass of C1 which are likely related to extensive arthrosis noted at the craniocervical articulation and atlantoaxial articulations. No particularly aggressive features. No visible cervical spine fracture is evident. Mild vertebral body height loss in the C5 and C6 levels is likely on a degenerative basis. Soft tissues and spinal canal: No pre or paravertebral fluid or swelling. No visible canal hematoma. Partially calcified pannus formation posterior to the dens. Disc levels: Multilevel cervical spondylitic changes are present with several posterior disc osteophyte complexes findings result in mild canal stenosis at C4-5, C5-6. Uncinate spurring and facet hypertrophic changes result in moderate foraminal narrowing on the right at C4-5 and C5-6, on the left at C3-4, and bilaterally C6-7. Upper chest: No acute abnormality in the upper chest or imaged lung apices. Other: Atherosclerotic plaque within the great vessels and cervical carotid arteries. IMPRESSION: 1. No acute intracranial abnormality. 2. Remote right PICA distribution infarct. 3. Chronic microvascular ischemic changes, volume loss and atherosclerosis. 4. No acute cervical spine fracture. 5. Extensive cervical spondylitic changes result in at most mild canal stenosis both mild-to-moderate multilevel neural foraminal narrowing, detailed above. 6. Subcortical cystic changes the lateral mass of C1 are favored to be degenerative in nature. Electronically Signed   By: Lovena Le M.D.   On: 07/02/2019 15:36   Ct Cervical Spine Wo Contrast  Result Date: 07/02/2019 CLINICAL DATA:  [place] from bed, positive head strike EXAM: CT HEAD WITHOUT CONTRAST CT CERVICAL SPINE WITHOUT CONTRAST TECHNIQUE: Multidetector CT  imaging of the head and cervical spine was performed following the standard protocol  without intravenous contrast. Multiplanar CT image reconstructions of the cervical spine were also generated. 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 No evidence of acute infarction, hemorrhage, hydrocephalus, extra-axial collection or mass lesion/mass effect. Symmetric prominence of the ventricles, cisterns and sulci compatible with parenchymal volume loss. Patchy areas of white matter hypoattenuation are most compatible with chronic microvascular angiopathy. Vascular: Atherosclerotic calcification of the carotid siphons and intradural vertebral arteries. Skull: Remote left frontal and parietal burr holes. No calvarial fracture or suspicious osseous lesion. No scalp swelling or hematoma. Sinuses/Orbits: Paranasal sinuses and mastoid air cells are predominantly clear. Orbital structures are unremarkable aside from prior lens extractions. Other: None CT CERVICAL SPINE FINDINGS Alignment: Degenerative reversal of the normal cervical lordosis centered at C5 with degenerative stepwise anterolisthesis C2-C5 and retrolisthesis C5 to C7. craniocervical and atlantoaxial alignment is maintained. Small os odontoideum, likely degenerative in nature. Skull base and vertebrae: Subcortical cystic changes are present in the lateral mass of C1 which are likely related to extensive arthrosis noted at the craniocervical articulation and atlantoaxial articulations. No particularly aggressive features. No visible cervical spine fracture is evident. Mild vertebral body height loss in the C5 and C6 levels is likely on a degenerative basis. Soft tissues and spinal canal: No pre or paravertebral fluid or swelling. No visible canal hematoma. Partially calcified pannus formation posterior to the dens. Disc levels: Multilevel cervical spondylitic changes are present with several posterior disc osteophyte complexes findings result in mild  canal stenosis at C4-5, C5-6. Uncinate spurring and facet hypertrophic changes result in moderate foraminal narrowing on the right at C4-5 and C5-6, on the left at C3-4, and bilaterally C6-7. Upper chest: No acute abnormality in the upper chest or imaged lung apices. Other: Atherosclerotic plaque within the great vessels and cervical carotid arteries. IMPRESSION: 1. No acute intracranial abnormality. 2. Remote right PICA distribution infarct. 3. Chronic microvascular ischemic changes, volume loss and atherosclerosis. 4. No acute cervical spine fracture. 5. Extensive cervical spondylitic changes result in at most mild canal stenosis both mild-to-moderate multilevel neural foraminal narrowing, detailed above. 6. Subcortical cystic changes the lateral mass of C1 are favored to be degenerative in nature. Electronically Signed   By: Lovena Le M.D.   On: 07/02/2019 15:36    Pending Labs Unresulted Labs (From admission, onward)    Start     Ordered   07/02/19 1645  Lactic acid, plasma  Now then every 2 hours,   STAT     07/02/19 1644   Signed and Held  CBC  (enoxaparin (LOVENOX)    CrCl >/= 30 ml/min)  Once,   R    Comments: Baseline for enoxaparin therapy IF NOT ALREADY DRAWN.  Notify MD if PLT < 100 K.    Signed and Held   Signed and Held  Creatinine, serum  (enoxaparin (LOVENOX)    CrCl >/= 30 ml/min)  Once,   R    Comments: Baseline for enoxaparin therapy IF NOT ALREADY DRAWN.    Signed and Held   Signed and Held  Creatinine, serum  (enoxaparin (LOVENOX)    CrCl >/= 30 ml/min)  Weekly,   R    Comments: while on enoxaparin therapy    Signed and Held   Signed and Held  Hepatic function panel  Once,   R     Signed and Held  Signed and Held  TSH  Once,   R     Signed and Held   Signed and Held  Basic metabolic panel  Tomorrow morning,   R     Signed and Held   Signed and Held  CBC  Tomorrow morning,   R     Signed and Held          Vitals/Pain Today's Vitals   07/02/19 1635 07/02/19  1700 07/02/19 1800 07/02/19 1830  BP:  (!) 152/99 (!) 160/97 (!) 145/96  Pulse: (!) 101   (!) 101  Resp: (!) _0 Temp:      TempSrc:      SpO2: 99%   (!) 81%  Weight:      Height:      PainSc:        Isolation Precautions No active isolations  Medications Medications  lactated ringers bolus 1,000 mL (1,000 mLs Intravenous New Bag/Given 07/02/19 1751)  lactated ringers bolus 1,000 mL (1,000 mLs Intravenous New Bag/Given 07/02/19 1918)    Mobility walks High fall risk   Focused Assessments Other   R Recommendations: See Admitting Provider Note  Report given to:   Additional Notes:

## 2019-07-02 NOTE — ED Notes (Signed)
Pt changed and assisted with urinal at this time. Pt laying back in bed and resting at this time

## 2019-07-02 NOTE — ED Notes (Signed)
Attempted to call report. Floor requested five minutes to review patient chart. Will call back.

## 2019-07-02 NOTE — ED Triage Notes (Addendum)
Pt comes into the ED via EMS from home, states pt fell out of bed with wife while having intimate moment, pt states he did hit his head, and is c/o head and neck pain states they were both too weak to get up out of the floor and been there all night. Pt is a/ox4 on arrival.

## 2019-07-02 NOTE — ED Notes (Signed)
MD informed of situation with pt and floors concern with lactic acid result. Per MD he is fine for the floor and she is aware of his results. Agricultural consultant notified

## 2019-07-02 NOTE — ED Provider Notes (Signed)
[facility] Emergency Department Provider Note   ____________________________________________   First MD Initiated Contact with Patient 07/02/19 1625     (approximate)  I have reviewed the triage vital signs and the nursing notes.   HISTORY  Chief Complaint Fall    HPI Ryan Travis is a 78 y.o. male with past medical history of alcohol abuse, multiple myeloma, hypertension, and chronic pain who presents to the ED following fall.  Patient states that he fell out of bed while having a "intimate moment" with his wife.  He complains of a headache, but denies other complaints at this time.  He initially denied alcohol use, later states that he has been drinking consistently over about the past 5 days.  He is unable to specify what or how much he has been drinking, states he was "off the sauce" for a couple months prior to this.  He denies any fevers, chills, chest pain, shortness of breath, abdominal pain, nausea, or vomiting.        Past Medical History:  Diagnosis Date  . Alcohol abuse   . Anxiety   . Arthritis   . 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 Cancer ([place])    multiple myeloma  . Depression   . Dysrhythmia   . Fall    3 weeks ago/ torn ligaments left knee/wearing brace/pt  . Hypertension   . Neuropathy   . Pain    chronic leg and ankle pain  . RLS (restless legs syndrome)     Patient Active Problem List   Diagnosis Date Noted  . Sepsis ([place]) 03/29/2019  . Alcoholic ketoacidosis 56/81/2751  . Pressure injury of skin 10/18/2016  . STEMI involving right coronary artery ([place]) 10/16/2016  . STEMI (ST elevation myocardial infarction) ([place]) 10/16/2016  . Myocardial infarct ([place]) 10/16/2016    Past Surgical History:  Procedure Laterality Date  . BRAIN SURGERY     subdural hematoma  . CARDIAC CATHETERIZATION N/A 10/16/2016   Procedure: Left Heart Cath and Coronary Angiography;  Surgeon: Nelva Bush, MD;  Location: [place] [facility];   Service: Cardiovascular;  Laterality: N/A;  . CARDIAC CATHETERIZATION N/A 10/16/2016   Procedure: Coronary Stent Intervention;  Surgeon: Nelva Bush, MD;  Location: [place] [facility];  Service: Cardiovascular;  Laterality: N/A;  . CARDIAC CATHETERIZATION N/A 10/16/2016   Procedure: Temporary Pacemaker;  Surgeon: Nelva Bush, MD;  Location: [place] [facility];  Service: Cardiovascular;  Laterality: N/A;  . CATARACT EXTRACTION W/PHACO Right 08/16/2016   Procedure: CATARACT EXTRACTION PHACO AND INTRAOCULAR LENS PLACEMENT (IOC);  Surgeon: Eulogio Bear, MD;  Location: [facility];  Service: Ophthalmology;  Laterality: Right;  Lot # [id number] H [place]: 01:00.8 AP%:16.5 CDE: 10.02   . CATARACT EXTRACTION W/PHACO Left 10/04/2016   Procedure: CATARACT EXTRACTION PHACO AND INTRAOCULAR LENS PLACEMENT (IOC);  Surgeon: Eulogio Bear, MD;  Location: [facility];  Service: Ophthalmology;  Laterality: Left;  [place] 35.4 AP% 10.5 CDE 4.11 Fluid pack lot # [id number]  . EYE SURGERY    . FRACTURE SURGERY     multiple right ankle  . HERNIA REPAIR    . JOINT REPLACEMENT Bilateral    thr  . KNEE ARTHROSCOPY W/ ACL RECONSTRUCTION     left x 2  . rcr      Prior to Admission medications   Medication Sig Start Date End Date Taking? Authorizing Provider  carvedilol (COREG) 6.25 MG tablet Take 1 tablet (6.25 mg total) by mouth 2 (two) times  daily with a meal. 04/02/19   Salary, Holly Bodily D, MD  folic acid (FOLVITE) 1 MG tablet Take 1 mg by mouth daily.    [provider]  gabapentin (NEURONTIN) 100 MG capsule Take 2 capsules (200 mg total) by mouth at bedtime. 04/02/19   Salary, Avel Peace, MD  HYDROcodone-acetaminophen (NORCO) 10-325 MG tablet Take 2 tablets by mouth daily.     [provider]  losartan (COZAAR) 25 MG tablet Take 25 mg by mouth daily.    [provider]  omeprazole (PRILOSEC) 20 MG capsule Take 20 mg by mouth daily.    [provider]  sertraline (ZOLOFT)  100 MG tablet Take 1 tablet (100 mg total) by mouth at bedtime. 04/02/19   Salary, Avel Peace, MD  simvastatin (ZOCOR) 40 MG tablet Take 40 mg by mouth at bedtime.    [provider]    Allergies Azithromycin, Morphine and related, Neomycin, Other, and Penicillins  Family History  Problem Relation Age of Onset  . Heart attack Mother   . Heart attack Father     Social History Social History   Tobacco Use  . Smoking status: Current Every Day Smoker    Packs/day: 0.30    Years: 20.00    Pack years: 6.00    Types: Cigarettes  . Smokeless tobacco: Never Used  Substance Use Topics  . Alcohol use: Yes    Frequency: Never    Comment: 2-3 beers/day or wine  . Drug use: No    Review of Systems  Constitutional: No fever/chills Eyes: No visual changes. ENT: No sore throat. Cardiovascular: Denies chest pain. Respiratory: Denies shortness of breath. Gastrointestinal: No abdominal pain.  No nausea, no vomiting.  No diarrhea.  No constipation. Genitourinary: Negative for dysuria. Musculoskeletal: Negative for back pain. Skin: Negative for rash. Neurological: Positive for headache, negative for focal weakness or numbness.  ____________________________________________   PHYSICAL EXAM:  VITAL SIGNS: ED Triage Vitals  Enc Vitals Group     BP 07/02/19 1444 (!) 110/58     Pulse Rate 07/02/19 1444 (!) 113     Resp 07/02/19 1444 17     Temp 07/02/19 1444 98.7 F (37.1 C)     Temp Source 07/02/19 1444 Oral     SpO2 07/02/19 1444 95 %     Weight 07/02/19 1447 180 lb (81.6 kg)     Height 07/02/19 1447 '6\' 2"'  (1.88 m)     Head Circumference --      Peak Flow --      Pain Score 07/02/19 1445 10     Pain Loc --      Pain Edu? --      Excl. in [place]? --     Constitutional: Alert and oriented.  Intoxicated appearing. Eyes: Conjunctivae are normal. Head: Atraumatic. Nose: No congestion/rhinnorhea. Mouth/Throat: Mucous membranes are moist. Neck: Normal ROM Cardiovascular:  Normal rate, regular rhythm. Grossly normal heart sounds. Respiratory: Normal respiratory effort.  No retractions. Lungs CTAB. Gastrointestinal: Soft and nontender. No distention. Genitourinary: deferred Musculoskeletal: No lower extremity tenderness nor edema. Neurologic:  Normal speech and language. No gross focal neurologic deficits are appreciated. Skin:  Skin is warm, dry and intact. No rash noted. Psychiatric: Mood and affect are normal. Speech and behavior are normal.  ____________________________________________   LABS (all labs ordered are listed, but only abnormal results are displayed)  Labs Reviewed  BASIC METABOLIC PANEL - Abnormal; Notable for the following components:      Result Value  CO2 18 (*)    Glucose, Bld 53 (*)    BUN 24 (*)    Calcium 8.8 (*)    GFR calc non Af Amer 58 (*)    Anion gap 20 (*)    All other components within normal limits  CBC - Abnormal; Notable for the following components:   RBC 3.84 (*)    Hemoglobin 12.0 (*)    HCT 36.3 (*)    All other components within normal limits  ETHANOL - Abnormal; Notable for the following components:   Alcohol, Ethyl (B) 192 (*)    All other components within normal limits  BETA-HYDROXYBUTYRIC ACID - Abnormal; Notable for the following components:   Beta-Hydroxybutyric Acid 2.18 (*)    All other components within normal limits  LACTIC ACID, PLASMA - Abnormal; Notable for the following components:   Lactic Acid, Venous 8.4 (*)    All other components within normal limits  SARS CORONAVIRUS 2 (TAT 6-12 HRS)  CK  MAGNESIUM  GLUCOSE, CAPILLARY  LACTIC ACID, PLASMA     PROCEDURES  Procedure(s) performed (including Critical Care):  Procedures   ____________________________________________   INITIAL IMPRESSION / ASSESSMENT AND PLAN / ED COURSE       78 year old male with history of alcohol abuse presents to the ED following fall out of bed, admits to consistent alcohol use over the past 5 days.   No obvious traumatic injuries on exam, head and C-spine CTs negative for acute process.  Patient moving all extremities equal with a non-focal neurologic exam.  No chest, abdominal, or bony extremity tenderness to suggest other traumatic injury.  Labs consistent with alcoholic ketoacidosis given elevated anion gap, beta hydroxybutyrate, and lactic acid.  IV fluid hydration initiated and case discussed with hospitalist, who accepts patient for admission.      ____________________________________________   FINAL CLINICAL IMPRESSION(S) / ED DIAGNOSES  Final diagnoses:  Alcoholic ketoacidosis  Lactic acidosis  Alcoholic intoxication with complication [facility])     ED Discharge Orders    None       Note:  This document was prepared using Dragon voice recognition software and may include unintentional dictation errors.   Blake Divine, MD 07/02/19 [id number]

## 2019-07-02 NOTE — ED Notes (Signed)
Lactic re drawn and sent to lab  

## 2019-07-02 NOTE — ED Notes (Signed)
Pt given juice, peanut butter and graham crackers at this time

## 2019-07-02 NOTE — ED Notes (Signed)
Attempted to call report to floor after floor requested 5 minutes to view patient chart. Per RN she has not been able to review chart. I notified RN that I will call again in 5 minutes.

## 2019-07-02 NOTE — ED Notes (Signed)
Date and time results received: 07/02/19 1900 (use smartphrase ".now" to insert current time)  Test: Lactic Critical Value: 8.4  Name of Provider Notified: Charna Archer  Orders Received? Or Actions Taken?: Orders Received - See Orders for details

## 2019-07-02 NOTE — ED Notes (Signed)
Attempted to call report. Per Kennyth Lose RN they are concerned about pt's lactic. Informed RN that 2nd lactic had been sent. Will inform RN Charge Raquel

## 2019-07-03 LAB — BASIC METABOLIC PANEL
Anion gap: 11 (ref 5–15)
BUN: 22 mg/dL (ref 8–23)
CO2: 25 mmol/L (ref 22–32)
Calcium: 8.4 mg/dL — ABNORMAL LOW (ref 8.9–10.3)
Chloride: 101 mmol/L (ref 98–111)
Creatinine, Ser: 0.88 mg/dL (ref 0.61–1.24)
GFR calc Af Amer: >60 mL/min (ref >60)
GFR calc non Af Amer: >60 mL/min (ref >60)
Glucose, Bld: 82 mg/dL (ref 70–99)
Potassium: 3.8 mmol/L (ref 3.5–5.1)
Sodium: 137 mmol/L (ref 135–145)

## 2019-07-03 LAB — CBC
HCT: 29.8 % — ABNORMAL LOW (ref 39.0–52.0)
Hemoglobin: 9.9 g/dL — ABNORMAL LOW (ref 13.0–17.0)
MCH: 31 pg (ref 26.0–34.0)
MCHC: 33.2 g/dL (ref 30.0–36.0)
MCV: 93.4 fL (ref 80.0–100.0)
Platelets: 148 10*3/uL — ABNORMAL LOW (ref 150–400)
RBC: 3.19 MIL/uL — ABNORMAL LOW (ref 4.22–5.81)
RDW: 13.2 % (ref 11.5–15.5)
WBC: 7.7 10*3/uL (ref 4.0–10.5)
nRBC: 0 % (ref 0.0–0.2)

## 2019-07-03 MED ORDER — SODIUM CHLORIDE 0.9% FLUSH: 10.0000 mL | INTRAVENOUS | Status: DC | PRN

## 2019-07-03 MED ORDER — SODIUM CHLORIDE 0.9 % IV SOLN
INTRAVENOUS | Status: DC
  Administered 2019-07-03: 16:00:00 via INTRAVENOUS

## 2019-07-03 NOTE — Progress Notes (Signed)
Advanced care plan.  Purpose of the Encounter: CODE STATUS  Parties in Attendance: Patient himself  Patient's Decision Capacity: Intact  Subjective/Patient's story: Ryan Travis  is a 78 y.o. male with a known history of alcohol abuse, multiple myeloma, hypertension, chronic pain.  Who is presenting with a fall and alcoholic ketoacidosis   Objective/Medical story  I discussed with the patient regarding his desires for cardiac and pulmonary system  Goals of care determination:   Full code  CODE STATUS: Full code   Time spent discussing advanced care planning: 16 minutes

## 2019-07-03 NOTE — Evaluation (Signed)
Physical Therapy Evaluation Patient Details Name: Ryan Travis MRN: [id number] DOB: July 09, 1941 Today's Date: 07/03/2019   History of Present Illness  From MD H&P: Pt is a 78 y.o. male with a known history of alcohol abuse, multiple myeloma, hypertension, chronic pain.  He presented to the emergency room after a fall during intimacy with his wife.  He had complained of a headache earlier upon arrival.  However, he denies pain currently.  He reports he and his wife had been drinking consistently over the last 5 to 6 days.  He reports a long-term history of alcoholism however had stopped drinking several months ago until 5 days ago.  CT head and cervical spine demonstrate no acute abnormalities.  MD assessment includes: Alcoholic ketoacidosis, EtOH abuse, GERD, depression, and HLD.    Clinical Impression  Pt presents with deficits in strength, transfers, mobility, gait, balance, and activity tolerance.  Pt required extra time and effort but no assist with bed mobility tasks.  Pt was min A with transfers with cues for sequencing and CGA with amb 3 x 5' with a RW.  Pt reports mostly using a w/c at home for mobility but several months ago was at a SNF for rehab and had gotten to the point where he could amb 200 feet with a RW.  Pt reports can still ambulate at home with a RW but mostly just limited household distances.  Pt will benefit from HHPT services upon discharge to safely address above deficits for decreased caregiver assistance and decreased risk of further functional decline.      Follow Up Recommendations Home health PT;Supervision for mobility/OOB    Equipment Recommendations  None recommended by PT    Recommendations for Other Services       Precautions / Restrictions Precautions Precautions: Fall Restrictions Weight Bearing Restrictions: No      Mobility  Bed Mobility Overal bed mobility: Modified Independent             General bed mobility comments: Extra time and  effort with bed mobility tasks but no physical assistance required  Transfers Overall transfer level: Needs assistance Equipment used: Rolling walker (2 wheeled) Transfers: Sit to/from Stand Sit to Stand: Min assist         General transfer comment: Min verbal cues for sequencing  Ambulation/Gait   Gait Distance (Feet): 5 Feet with CGA Assistive device: Rolling walker (2 wheeled) Gait Pattern/deviations: Step-through pattern;Decreased step length - right;Decreased step length - left;Trunk flexed Gait velocity: decreased   General Gait Details: Mod lean on the RW for support with pt able to amb 3 x 5' with seated therapeutic rest breaks between.  Pt has a leg length discrepancy that increased pt's effort with amb that is addressed with shoe lift at home.  Stairs            Wheelchair Mobility    Modified Rankin (Stroke Patients Only)       Balance Overall balance assessment: Needs assistance Sitting-balance support: Feet supported;Bilateral upper extremity supported Sitting balance-Leahy Scale: Normal     Standing balance support: Bilateral upper extremity supported Standing balance-Leahy Scale: Fair Standing balance comment: Mod lean on the RW for support in standing                             Pertinent Vitals/Pain Pain Assessment: No/denies pain    Home Living Family/patient expects to be discharged to:: Private residence Living Arrangements: Spouse/significant other Available  Help at Discharge: Family;Personal care attendant;Available 24 hours/day(PCA M-F for 2-3 hrs/day) Type of Home: House Home Access: Ramped entrance     Home Layout: One level Home Equipment: Grab bars - toilet;Grab bars - tub/shower;Walker - 2 wheels;Wheelchair - [employer];Shower seat Additional Comments: PCA assists with house cleaning    Prior Function Level of Independence: Independent with assistive device(s)         Comments: Pt reports after  rehab in a SNF several months ago that he was able to amb 200' with a RW but now only ambulates limited HH distances, mostly uses a w/c for mobility, no other falls other than fall leading to this admission, Ind with ADLs     Hand Dominance        Extremity/Trunk Assessment   Upper Extremity Assessment Upper Extremity Assessment: Generalized weakness    Lower Extremity Assessment Lower Extremity Assessment: Generalized weakness       Communication   Communication: No difficulties  Cognition Arousal/Alertness: Awake/alert Behavior During Therapy: WFL for tasks assessed/performed Overall Cognitive Status: Within Functional Limits for tasks assessed                                        General Comments      Exercises Total Joint Exercises Ankle Circles/Pumps: AROM;Left;10 reps;15 reps Quad Sets: Strengthening;Both;10 reps;15 reps Gluteal Sets: Strengthening;Both;10 reps Towel Squeeze: Strengthening;Both;10 reps Short Arc Quad: Strengthening;Both;10 reps Heel Slides: AROM;Both;10 reps Hip ABduction/ADduction: AROM;Both;10 reps Long Arc Quad: Strengthening;Both;15 reps;10 reps Knee Flexion: Strengthening;Both;10 reps;15 reps Marching in Standing: AROM;Both;10 reps;Seated Other Exercises Other Exercises: HEP for LLE APs and BLE GS, QS, and LAQs x 10 each 5-6x/day   Assessment/Plan    PT Assessment Patient needs continued PT services  PT Problem List Decreased strength;Decreased activity tolerance;Decreased balance;Decreased mobility;Decreased knowledge of use of DME       PT Treatment Interventions DME instruction;Gait training;Functional mobility training;Therapeutic activities;Therapeutic exercise;Balance training;Patient/family education    PT Goals (Current goals can be found in the Care Plan section)  Acute Rehab PT Goals Patient Stated Goal: To return home and get stronger PT Goal Formulation: With patient Time For Goal Achievement:  07/16/19 Potential to Achieve Goals: Good    Frequency Min 2X/week   Barriers to discharge        Co-evaluation               AM-PAC PT "6 Clicks" Mobility  Outcome Measure Help needed turning from your back to your side while in a flat bed without using bedrails?: None Help needed moving from lying on your back to sitting on the side of a flat bed without using bedrails?: None Help needed moving to and from a bed to a chair (including a wheelchair)?: A Little Help needed standing up from a chair using your arms (e.g., wheelchair or bedside chair)?: A Little Help needed to walk in hospital room?: A Lot Help needed climbing 3-5 steps with a railing? : Total 6 Click Score: 17    End of Session Equipment Utilized During Treatment: Gait belt Activity Tolerance: Patient tolerated treatment well Patient left: in chair;with call bell/phone within reach;with chair alarm set Nurse Communication: Mobility status;Other (comment)(Chair alarm pad set under pt but no box to plug it in to, nursing notified) PT Visit Diagnosis: Unsteadiness on feet (R26.81);Muscle weakness (generalized) (M62.81);Difficulty in walking, not elsewhere classified (R26.2)    Time: 3785-8850 PT  Time Calculation (min) (ACUTE ONLY): 36 min   Charges:   PT Evaluation $PT Eval Low Complexity: 1 Low PT Treatments $Therapeutic Exercise: 8-22 mins       D. Royetta Asal PT, DPT 07/03/19, 5:47 PM

## 2019-07-03 NOTE — Progress Notes (Signed)
[place] [facility]                                                                                                                                                                                  Patient Demographics   Ryan Travis, is a 78 y.o. male, DOB - [birth year], [id number]  Admit date - 07/02/2019   Admitting Physician Demetrios Loll, MD  Outpatient Primary MD for the patient is [facility] [place]   LOS - 1  Subjective: Patient admitted with fall.  Patient's lactic acid is still elevated he is admitted with alcoholic ketoacidosis    Review of Systems:   CONSTITUTIONAL: No documented fever. No fatigue, weakness. No weight gain, no weight loss.  EYES: No blurry or double vision.  ENT: No tinnitus. No postnasal drip. No redness of the oropharynx.  RESPIRATORY: No cough, no wheeze, no hemoptysis. No dyspnea.  CARDIOVASCULAR: No chest pain. No orthopnea. No palpitations. No syncope.  GASTROINTESTINAL: No nausea, no vomiting or diarrhea. No abdominal pain. No melena or hematochezia.  GENITOURINARY: No dysuria or hematuria.  ENDOCRINE: No polyuria or nocturia. No heat or cold intolerance.  HEMATOLOGY: No anemia. No bruising. No bleeding.  INTEGUMENTARY: No rashes. No lesions.  MUSCULOSKELETAL: No arthritis. No swelling. No gout.  NEUROLOGIC: No numbness, tingling, or ataxia. No seizure-type activity.  PSYCHIATRIC: No anxiety. No insomnia. No ADD.    Vitals:   Vitals:   07/02/19 2000 07/02/19 2030 07/03/19 0017 07/03/19 0612  BP:  (!) 169/96 (!) 162/97 (!) 149/94  Pulse: 100 (!) 113 95 85  Resp: 20 19 17 18   Temp: 98.5 F (36.9 C)  98.1 F (36.7 C) 98.9 F (37.2 C)  TempSrc:   Oral Oral  SpO2: 100% 97% 96% 96%  Weight:      Height:        Wt Readings from Last 3 Encounters:  07/02/19 81.6 kg  03/29/19 77.1 kg  01/08/19 84.4 kg     Intake/Output Summary (Last 24 hours) at 07/03/2019 1501 Last data filed at 07/03/2019  1413 Gross per 24 hour  Intake -  Output 3200 ml  Net -3200 ml    Physical Exam:   GENERAL: Pleasant-appearing in no apparent distress.  HEAD, EYES, EARS, NOSE AND THROAT: Atraumatic, normocephalic. Extraocular muscles are intact. Pupils equal and reactive to light. Sclerae anicteric. No conjunctival injection. No oro-pharyngeal erythema.  NECK: Supple. There is no jugular venous distention. No bruits, no lymphadenopathy, no thyromegaly.  HEART: Regular rate and rhythm,. No murmurs, no rubs, no clicks.  LUNGS: Clear to auscultation bilaterally. No rales or rhonchi. No wheezes.  ABDOMEN: Soft, flat, nontender, nondistended. Has  good bowel sounds. No hepatosplenomegaly appreciated.  EXTREMITIES: No evidence of any cyanosis, clubbing, or peripheral edema.  +2 pedal and radial pulses bilaterally.  NEUROLOGIC: The patient is alert, awake, and oriented x3 with no focal motor or sensory deficits appreciated bilaterally.  SKIN: Moist and warm with no rashes appreciated.  Psych: Not anxious, depressed LN: No inguinal LN enlargement    Antibiotics   Anti-infectives (From admission, onward)   None      Medications   Scheduled Meds: . carvedilol  6.25 mg Oral BID WC  . enoxaparin (LOVENOX) injection  40 mg Subcutaneous Q24H  . folic acid  1 mg Oral Daily  . gabapentin  200 mg Oral QHS  . losartan  25 mg Oral Daily  . multivitamin with minerals  1 tablet Oral Daily  . pantoprazole  40 mg Oral Daily  . sertraline  100 mg Oral QHS  . simvastatin  40 mg Oral QHS  . thiamine  100 mg Intravenous Daily  . thiamine  100 mg Oral Daily   Continuous Infusions: PRN Meds:.LORazepam **OR** LORazepam, ondansetron **OR** ondansetron (ZOFRAN) IV, sodium chloride flush, traZODone   Data Review:   Micro Results Recent Results (from the past 240 hour(s))  SARS Coronavirus 2 [facility] order, Performed in [place] hospital lab)     Status: None   Collection Time: 07/02/19  7:20 PM  Result  Value Ref Range Status   SARS Coronavirus 2 NEGATIVE NEGATIVE Final    Comment: (NOTE) If result is NEGATIVE SARS-CoV-2 target nucleic acids are NOT DETECTED. The SARS-CoV-2 RNA is generally detectable in upper and lower  respiratory specimens during the acute phase of infection. The lowest  concentration of SARS-CoV-2 viral copies this assay can detect is 250  copies / mL. A negative result does not preclude SARS-CoV-2 infection  and should not be used as the sole basis for treatment or other  patient management decisions.  A negative result may occur with  improper specimen collection / handling, submission of specimen other  than nasopharyngeal swab, presence of viral mutation(s) within the  areas targeted by this assay, and inadequate number of viral copies  (<250 copies / mL). A negative result must be combined with clinical  observations, patient history, and epidemiological information. If result is POSITIVE SARS-CoV-2 target nucleic acids are DETECTED. The SARS-CoV-2 RNA is generally detectable in upper and lower  respiratory specimens dur ing the acute phase of infection.  Positive  results are indicative of active infection with SARS-CoV-2.  Clinical  correlation with patient history and other diagnostic information is  necessary to determine patient infection status.  Positive results do  not rule out bacterial infection or co-infection with other viruses. If result is PRESUMPTIVE POSTIVE SARS-CoV-2 nucleic acids MAY BE PRESENT.   A presumptive positive result was obtained on the submitted specimen  and confirmed on repeat testing.  While 2019 novel coronavirus  (SARS-CoV-2) nucleic acids may be present in the submitted sample  additional confirmatory testing may be necessary for epidemiological  and / or clinical management purposes  to differentiate between  SARS-CoV-2 and other Sarbecovirus currently known to infect humans.  If clinically indicated additional testing  with an alternate test  methodology [phone]) is advised. The SARS-CoV-2 RNA is generally  detectable in upper and lower respiratory sp ecimens during the acute  phase of infection. The expected result is Negative. Fact Sheet for Patients:  StrictlyIdeas.no Fact Sheet for Healthcare Providers: BankingDealers.co.za This test is not yet approved or  cleared by the [place] and has been authorized for detection and/or diagnosis of SARS-CoV-2 by FDA under an Emergency Use Authorization (EUA).  This EUA will remain in effect (meaning this test can be used) for the duration of the COVID-19 declaration under Section 564(b)(1) of the Act, 21 U.S.C. section 360bbb-3(b)(1), unless the authorization is terminated or revoked sooner. Performed at [facility], [address] [place], [place] [address]     Radiology Reports Ct Head Wo Contrast  Result Date: 07/02/2019 CLINICAL DATA:  [place] from bed, positive head strike EXAM: CT HEAD WITHOUT CONTRAST CT CERVICAL SPINE WITHOUT CONTRAST TECHNIQUE: Multidetector CT imaging of the head and cervical spine was performed following the standard protocol without intravenous contrast. Multiplanar CT image reconstructions of the cervical spine were also generated. 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 No evidence of acute infarction, hemorrhage, hydrocephalus, extra-axial collection or mass lesion/mass effect. Symmetric prominence of the ventricles, cisterns and sulci compatible with parenchymal volume loss. Patchy areas of white matter hypoattenuation are most compatible with chronic microvascular angiopathy. Vascular: Atherosclerotic calcification of the carotid siphons and intradural vertebral arteries. Skull: Remote left frontal and parietal burr holes. No calvarial fracture or suspicious osseous  lesion. No scalp swelling or hematoma. Sinuses/Orbits: Paranasal sinuses and mastoid air cells are predominantly clear. Orbital structures are unremarkable aside from prior lens extractions. Other: None CT CERVICAL SPINE FINDINGS Alignment: Degenerative reversal of the normal cervical lordosis centered at C5 with degenerative stepwise anterolisthesis C2-C5 and retrolisthesis C5 to C7. craniocervical and atlantoaxial alignment is maintained. Small os odontoideum, likely degenerative in nature. Skull base and vertebrae: Subcortical cystic changes are present in the lateral mass of C1 which are likely related to extensive arthrosis noted at the craniocervical articulation and atlantoaxial articulations. No particularly aggressive features. No visible cervical spine fracture is evident. Mild vertebral body height loss in the C5 and C6 levels is likely on a degenerative basis. Soft tissues and spinal canal: No pre or paravertebral fluid or swelling. No visible canal hematoma. Partially calcified pannus formation posterior to the dens. Disc levels: Multilevel cervical spondylitic changes are present with several posterior disc osteophyte complexes findings result in mild canal stenosis at C4-5, C5-6. Uncinate spurring and facet hypertrophic changes result in moderate foraminal narrowing on the right at C4-5 and C5-6, on the left at C3-4, and bilaterally C6-7. Upper chest: No acute abnormality in the upper chest or imaged lung apices. Other: Atherosclerotic plaque within the great vessels and cervical carotid arteries. IMPRESSION: 1. No acute intracranial abnormality. 2. Remote right PICA distribution infarct. 3. Chronic microvascular ischemic changes, volume loss and atherosclerosis. 4. No acute cervical spine fracture. 5. Extensive cervical spondylitic changes result in at most mild canal stenosis both mild-to-moderate multilevel neural foraminal narrowing, detailed above. 6. Subcortical cystic changes the lateral mass  of C1 are favored to be degenerative in nature. Electronically Signed   By: Lovena Le M.D.   On: 07/02/2019 15:36   Ct Cervical Spine Wo Contrast  Result Date: 07/02/2019 CLINICAL DATA:  [place] from bed, positive head strike EXAM: CT HEAD WITHOUT CONTRAST CT CERVICAL SPINE WITHOUT CONTRAST TECHNIQUE: Multidetector CT imaging of the head and cervical spine was performed following the standard protocol without intravenous contrast. Multiplanar CT image reconstructions of the cervical spine were also generated. 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 No evidence of acute infarction, hemorrhage, hydrocephalus, extra-axial collection or mass lesion/mass effect. Symmetric prominence of the ventricles, cisterns and sulci compatible with parenchymal volume loss. Patchy areas of white matter hypoattenuation are most compatible with chronic microvascular angiopathy. Vascular: Atherosclerotic calcification of the carotid siphons and intradural vertebral arteries. Skull: Remote left frontal and parietal burr holes. No calvarial fracture or suspicious osseous lesion. No scalp swelling or hematoma. Sinuses/Orbits: Paranasal sinuses and mastoid air cells are predominantly clear. Orbital structures are unremarkable aside from prior lens extractions. Other: None CT CERVICAL SPINE FINDINGS Alignment: Degenerative reversal of the normal cervical lordosis centered at C5 with degenerative stepwise anterolisthesis C2-C5 and retrolisthesis C5 to C7. craniocervical and atlantoaxial alignment is maintained. Small os odontoideum, likely degenerative in nature. Skull base and vertebrae: Subcortical cystic changes are present in the lateral mass of C1 which are likely related to extensive arthrosis noted at the craniocervical articulation and atlantoaxial articulations. No particularly aggressive features. No visible cervical spine  fracture is evident. Mild vertebral body height loss in the C5 and C6 levels is likely on a degenerative basis. Soft tissues and spinal canal: No pre or paravertebral fluid or swelling. No visible canal hematoma. Partially calcified pannus formation posterior to the dens. Disc levels: Multilevel cervical spondylitic changes are present with several posterior disc osteophyte complexes findings result in mild canal stenosis at C4-5, C5-6. Uncinate spurring and facet hypertrophic changes result in moderate foraminal narrowing on the right at C4-5 and C5-6, on the left at C3-4, and bilaterally C6-7. Upper chest: No acute abnormality in the upper chest or imaged lung apices. Other: Atherosclerotic plaque within the great vessels and cervical carotid arteries. IMPRESSION: 1. No acute intracranial abnormality. 2. Remote right PICA distribution infarct. 3. Chronic microvascular ischemic changes, volume loss and atherosclerosis. 4. No acute cervical spine fracture. 5. Extensive cervical spondylitic changes result in at most mild canal stenosis both mild-to-moderate multilevel neural foraminal narrowing, detailed above. 6. Subcortical cystic changes the lateral mass of C1 are favored to be degenerative in nature. Electronically Signed   By: Lovena Le M.D.   On: 07/02/2019 15:36     CBC Recent Labs  Lab 07/02/19 1505 07/03/19 0442  WBC 10.5 7.7  HGB 12.0* 9.9*  HCT 36.3* 29.8*  PLT 265 148*  MCV 94.5 93.4  MCH 31.3 31.0  MCHC 33.1 33.2  RDW 13.4 13.2    Chemistries  Recent Labs  Lab 07/02/19 1457 07/02/19 1505 07/02/19 1748 07/03/19 0442  NA  --  141  --  137  K  --  4.4  --  3.8  CL  --  103  --  101  CO2  --  18*  --  25  GLUCOSE  --  53*  --  82  BUN  --  24*  --  22  CREATININE  --  1.21  --  0.88  CALCIUM  --  8.8*  --  8.4*  MG  --   --  1.9  --   AST 43*  --   --   --   ALT 15  --   --   --   ALKPHOS 92  --   --   --   BILITOT 0.9  --   --   --     ------------------------------------------------------------------------------------------------------------------ estimated creatinine clearance is 82.4 mL/min (by C-G formula based on SCr of 0.88 mg/dL). ------------------------------------------------------------------------------------------------------------------ No results for input(s): HGBA1C in the last 72 hours. ------------------------------------------------------------------------------------------------------------------ No results for  input(s): CHOL, HDL, LDLCALC, TRIG, CHOLHDL, LDLDIRECT in the last 72 hours. ------------------------------------------------------------------------------------------------------------------ Recent Labs    07/02/19 1457  TSH 1.542   ------------------------------------------------------------------------------------------------------------------ No results for input(s): VITAMINB12, FOLATE, FERRITIN, TIBC, IRON, RETICCTPCT in the last 72 hours.  Coagulation profile No results for input(s): INR, PROTIME in the last 168 hours.  No results for input(s): DDIMER in the last 72 hours.  Cardiac Enzymes No results for input(s): CKMB, TROPONINI, MYOGLOBIN in the last 168 hours.  Invalid input(s): CK ------------------------------------------------------------------------------------------------------------------ Invalid input(s): [place]   1.  Alcoholic ketoacidosis -  Continue IV fluid -Repeat BMP in the a.m.  2.  EtOH abuse -  Continue CIWA protocol  3.  GERD - Protonix continued  4.  Depression - Zoloft continued  5.  Hyperlipidemia -Zocor continued  6.  Hypertension continue Coreg and losartan       Code Status Orders  (From admission, onward)         Start     Ordered   07/02/19 2248  Full code  Continuous     07/02/19 2247        Code Status History    Date Active Date Inactive Code Status Order ID Comments User Context    03/29/2019 1920 04/02/2019 1935 Full Code [id number]  Henreitta Leber, MD Inpatient   01/08/2019 1021 01/12/2019 1756 Full Code [id number]  Harrie Foreman, MD Inpatient   10/16/2016 1834 10/19/2016 1632 Full Code [id number]  Nelva Bush, MD Inpatient   Advance Care Planning Activity           Consults none   DVT Prophylaxis  Lovenox  Lab Results  Component Value Date   PLT 148 (L) 07/03/2019     Time Spent in minutes 70min Greater than 50% of time spent in care coordination and counseling patient regarding the condition and plan of care.   Dustin Flock M.D on 07/03/2019 at 3:01 PM  Between 7am to 6pm - Pager - [phone]  After 6pm go to [email or web address] - Proofreader  [employer]   Office  [phone]

## 2019-07-04 LAB — CBC
HCT: 34.7 % — ABNORMAL LOW (ref 39.0–52.0)
Hemoglobin: 11.6 g/dL — ABNORMAL LOW (ref 13.0–17.0)
MCH: 30.9 pg (ref 26.0–34.0)
MCHC: 33.4 g/dL (ref 30.0–36.0)
MCV: 92.5 fL (ref 80.0–100.0)
Platelets: 130 10*3/uL — ABNORMAL LOW (ref 150–400)
RBC: 3.75 MIL/uL — ABNORMAL LOW (ref 4.22–5.81)
RDW: 13.2 % (ref 11.5–15.5)
WBC: 5.6 10*3/uL (ref 4.0–10.5)
nRBC: 0 % (ref 0.0–0.2)

## 2019-07-04 LAB — BASIC METABOLIC PANEL
Anion gap: 8 (ref 5–15)
BUN: 13 mg/dL (ref 8–23)
CO2: 28 mmol/L (ref 22–32)
Calcium: 8.8 mg/dL — ABNORMAL LOW (ref 8.9–10.3)
Chloride: 103 mmol/L (ref 98–111)
Creatinine, Ser: 0.81 mg/dL (ref 0.61–1.24)
GFR calc Af Amer: >60 mL/min (ref >60)
GFR calc non Af Amer: >60 mL/min (ref >60)
Glucose, Bld: 116 mg/dL — ABNORMAL HIGH (ref 70–99)
Potassium: 3.2 mmol/L — ABNORMAL LOW (ref 3.5–5.1)
Sodium: 139 mmol/L (ref 135–145)

## 2019-07-04 LAB — LACTIC ACID, PLASMA: Lactic Acid, Venous: 0.9 mmol/L (ref 0.5–1.9)

## 2019-07-04 MED ORDER — POTASSIUM CHLORIDE CRYS ER 20 MEQ PO TBCR
40.0000 meq | EXTENDED_RELEASE_TABLET | Freq: Once | ORAL | Status: AC
  Administered 2019-07-04: 40 meq via ORAL
  Filled 2019-07-04: qty 2

## 2019-07-04 NOTE — Progress Notes (Signed)
EMS called for transport. Dr. Tressia Miners notified of B/P, OK to discharge

## 2019-07-04 NOTE — Discharge Summary (Signed)
[facility] NAME: Ryan Travis    MR#:  [id number]  DATE OF BIRTH:  02-Mar-1941  DATE OF ADMISSION:  07/02/2019   ADMITTING PHYSICIAN: Demetrios Loll, MD  DATE OF DISCHARGE:  07/04/19  PRIMARY CARE PHYSICIAN: [facility]   ADMISSION DIAGNOSIS:   Lactic acidosis [U31.4] Alcoholic ketoacidosis [H70.2] Alcoholic intoxication with complication ([place]) [O37.858]  DISCHARGE DIAGNOSIS:   Active Problems:   Alcoholic ketoacidosis   SECONDARY DIAGNOSIS:   Past Medical History:  Diagnosis Date  . Alcohol abuse   . Anxiety   . Arthritis   . 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 Cancer ([place])    multiple myeloma  . Depression   . Dysrhythmia   . Fall    3 weeks ago/ torn ligaments left knee/wearing brace/pt  . Hypertension   . Neuropathy   . Pain    chronic leg and ankle pain  . RLS (restless legs syndrome)     HOSPITAL COURSE:   78 year old male with past medical history significant for multiple myeloma, hypertension, alcohol use, chronic pain presents to hospital secondary to a dehydration and fall during intimacy with wife.  1.  Alcoholic ketoacidosis-patient received IV fluids. -Electrolytes have been replaced.  Anion gap is closed.  He feels much stronger -Physical therapy recommended home health  2.  Alcohol abuse-counseled, did not go into withdrawals in the hospital  3.  Hypertension-on losartan and Coreg  4.  GERD-Prilosec  5.  Depression-on Zoloft  Will be discharged home with home health today  DISCHARGE CONDITIONS:   Guarded  CONSULTS OBTAINED:   None  DRUG ALLERGIES:   Allergies  Allergen Reactions  . Azithromycin Other (See Comments)    Other reaction(s): Unknown   . Morphine And Related   . Neomycin   . Other     METHIOLATE  . Penicillins     Did it involve swelling of the face/tongue/throat, SOB, or low BP? Yes Did it involve sudden or severe rash/hives, skin peeling, or any  reaction on the inside of your mouth or nose? Yes Did you need to seek medical attention at a hospital or doctor's office? Yes When did it last happen? 10 years If all above answers are "NO", may proceed with cephalosporin use.   DISCHARGE MEDICATIONS:   Allergies as of 07/04/2019      Reactions   Azithromycin Other (See Comments)   Other reaction(s): Unknown   Morphine And Related    Neomycin    Other    METHIOLATE   Penicillins    Did it involve swelling of the face/tongue/throat, SOB, or low BP? Yes Did it involve sudden or severe rash/hives, skin peeling, or any reaction on the inside of your mouth or nose? Yes Did you need to seek medical attention at a hospital or doctor's office? Yes When did it last happen? 10 years If all above answers are "NO", may proceed with cephalosporin use.      Medication List    STOP taking these medications   HYDROcodone-acetaminophen 10-325 MG tablet Commonly known as: NORCO     TAKE these medications   carvedilol 6.25 MG tablet Commonly known as: COREG Take 1 tablet (6.25 mg total) by mouth 2 (two) times daily with a meal.   folic acid 1 MG tablet Commonly known as: FOLVITE Take 1 mg by mouth daily.   gabapentin 100 MG capsule Commonly known as: NEURONTIN Take 2 capsules (200 mg  total) by mouth at bedtime.   losartan 25 MG tablet Commonly known as: COZAAR Take 25 mg by mouth daily.   omeprazole 20 MG capsule Commonly known as: PRILOSEC Take 20 mg by mouth daily.   sertraline 100 MG tablet Commonly known as: ZOLOFT Take 1 tablet (100 mg total) by mouth at bedtime.   simvastatin 40 MG tablet Commonly known as: ZOCOR Take 40 mg by mouth at bedtime.        DISCHARGE INSTRUCTIONS:   1. PCP f/u in 1-2 weeks  DIET:   Cardiac diet  ACTIVITY:   Activity as tolerated  OXYGEN:   Home Oxygen: No.  Oxygen Delivery: room air  DISCHARGE LOCATION:   home   If you experience worsening of your admission symptoms,  develop shortness of breath, life threatening emergency, suicidal or homicidal thoughts you must seek medical attention immediately by calling 911 or calling your MD immediately  if symptoms less severe.  You Must read complete instructions/literature along with all the possible adverse reactions/side effects for all the Medicines you take and that have been prescribed to you. Take any new Medicines after you have completely understood and accpet all the possible adverse reactions/side effects.   Please note  You were cared for by a hospitalist during your hospital stay. If you have any questions about your discharge medications or the care you received while you were in the hospital after you are discharged, you can call the unit and asked to speak with the hospitalist on call if the hospitalist that took care of you is not available. Once you are discharged, your primary care physician will handle any further medical issues. Please note that NO REFILLS for any discharge medications will be authorized once you are discharged, as it is imperative that you return to your primary care physician (or establish a relationship with a primary care physician if you do not have one) for your aftercare needs so that they can reassess your need for medications and monitor your lab values.    On the day of Discharge:  VITAL SIGNS:   Blood pressure (!) 151/100, pulse 84, temperature 98.1 F (36.7 C), temperature source Oral, resp. rate 18, height '6\' 2"'  (1.88 m), weight 81.6 kg, SpO2 97 %.  PHYSICAL EXAMINATION:    GENERAL:  78 y.o.-year-old elderly patient lying in the bed with no acute distress.  EYES: Pupils equal, round, reactive to light and accommodation. No scleral icterus. Extraocular muscles intact.  HEENT: Head atraumatic, normocephalic. Oropharynx and nasopharynx clear.  NECK:  Supple, no jugular venous distention. No thyroid enlargement, no tenderness.  LUNGS: Normal breath sounds bilaterally,  no wheezing, rales,rhonchi or crepitation. No use of accessory muscles of respiration.  Decreased bibasilar breath sounds CARDIOVASCULAR: S1, S2 normal. No rubs, or gallops.  2/6 systolic murmur is present ABDOMEN: Soft, non-tender, non-distended. Bowel sounds present. No organomegaly or mass.  EXTREMITIES: No pedal edema, cyanosis, or clubbing.  NEUROLOGIC: Cranial nerves II through XII are intact. Muscle strength 5/5 in all extremities. Sensation intact. Gait not checked.  PSYCHIATRIC: The patient is alert and oriented x 3.  SKIN: No obvious rash, lesion, or ulcer.   DATA REVIEW:   CBC Recent Labs  Lab 07/04/19 0505  WBC 5.6  HGB 11.6*  HCT 34.7*  PLT 130*    Chemistries  Recent Labs  Lab 07/02/19 1457  07/02/19 1748  07/04/19 0505  NA  --    < >  --    < > 139  K  --    < >  --    < > 3.2*  CL  --    < >  --    < > 103  CO2  --    < >  --    < > 28  GLUCOSE  --    < >  --    < > 116*  BUN  --    < >  --    < > 13  CREATININE  --    < >  --    < > 0.81  CALCIUM  --    < >  --    < > 8.8*  MG  --   --  1.9  --   --   AST 43*  --   --   --   --   ALT 15  --   --   --   --   ALKPHOS 92  --   --   --   --   BILITOT 0.9  --   --   --   --    < > = values in this interval not displayed.     Microbiology Results  Results for orders placed or performed during the hospital encounter of 07/02/19  SARS Coronavirus 2 [facility] order, Performed in [facility] hospital lab)     Status: None   Collection Time: 07/02/19  7:20 PM  Result Value Ref Range Status   SARS Coronavirus 2 NEGATIVE NEGATIVE Final    Comment: (NOTE) If result is NEGATIVE SARS-CoV-2 target nucleic acids are NOT DETECTED. The SARS-CoV-2 RNA is generally detectable in upper and lower  respiratory specimens during the acute phase of infection. The lowest  concentration of SARS-CoV-2 viral copies this assay can detect is 250  copies / mL. A negative result does not preclude SARS-CoV-2 infection  and should  not be used as the sole basis for treatment or other  patient management decisions.  A negative result may occur with  improper specimen collection / handling, submission of specimen other  than nasopharyngeal swab, presence of viral mutation(s) within the  areas targeted by this assay, and inadequate number of viral copies  (<250 copies / mL). A negative result must be combined with clinical  observations, patient history, and epidemiological information. If result is POSITIVE SARS-CoV-2 target nucleic acids are DETECTED. The SARS-CoV-2 RNA is generally detectable in upper and lower  respiratory specimens dur ing the acute phase of infection.  Positive  results are indicative of active infection with SARS-CoV-2.  Clinical  correlation with patient history and other diagnostic information is  necessary to determine patient infection status.  Positive results do  not rule out bacterial infection or co-infection with other viruses. If result is PRESUMPTIVE POSTIVE SARS-CoV-2 nucleic acids MAY BE PRESENT.   A presumptive positive result was obtained on the submitted specimen  and confirmed on repeat testing.  While 2019 novel coronavirus  (SARS-CoV-2) nucleic acids may be present in the submitted sample  additional confirmatory testing may be necessary for epidemiological  and / or clinical management purposes  to differentiate between  SARS-CoV-2 and other Sarbecovirus currently known to infect humans.  If clinically indicated additional testing with an alternate test  methodology [phone]) is advised. The SARS-CoV-2 RNA is generally  detectable in upper and lower respiratory sp ecimens during the acute  phase of infection. The expected result is Negative. Fact Sheet for Patients:  StrictlyIdeas.no Fact  Sheet for Healthcare Providers: BankingDealers.co.za This test is not yet approved or cleared by the [place] and has been  authorized for detection and/or diagnosis of SARS-CoV-2 by FDA under an Emergency Use Authorization (EUA).  This EUA will remain in effect (meaning this test can be used) for the duration of the COVID-19 declaration under Section 564(b)(1) of the Act, 21 U.S.C. section 360bbb-3(b)(1), unless the authorization is terminated or revoked sooner. Performed at [facility], [address] [place], [place] [address]     RADIOLOGY:  No results found.   Management plans discussed with the patient, family and they are in agreement.  CODE STATUS:     Code Status Orders  (From admission, onward)         Start     Ordered   07/02/19 2248  Full code  Continuous     07/02/19 2247        Code Status History    Date Active Date Inactive Code Status Order ID Comments User Context   03/29/2019 1920 04/02/2019 1935 Full Code [id number]  Henreitta Leber, MD Inpatient   01/08/2019 1021 01/12/2019 1756 Full Code [id number]  Harrie Foreman, MD Inpatient   10/16/2016 1834 10/19/2016 1632 Full Code [id number]  Nelva Bush, MD Inpatient   Advance Care Planning Activity      TOTAL TIME TAKING CARE OF THIS PATIENT: 38 minutes.    Gladstone Lighter M.D on 07/04/2019 at 1:41 PM  Between 7am to 6pm - Pager - [phone]  After 6pm go to [email or web address] - Proofreader  [facility]  Office  [phone]  CC: Primary care physician; [place]   Note: This dictation was prepared with Dragon dictation along with smaller phrase technology. Any transcriptional errors that result from this process are unintentional.

## 2019-07-04 NOTE — TOC Transition Note (Signed)
Transition of Care [facility]) - CM/SW Discharge Note   Patient Details  Name: DEJUANE MATHAI MRN: [id number] Date of Birth: 02/18/1941  [facility]) CM/SW Contact:  Latanya Maudlin, RN Phone Number: 07/04/2019, 1:36 PM   Clinical Narrative:   Patient to be discharged per MD order. Orders in place for home health services. Patient has used [facility] in the past and referral was sen to them. Cheryl from [place] can accept. No DME needs. Family to transport.     Final next level of care: Home w Home Health Services Barriers to Discharge: No Barriers Identified   Patient Goals and CMS Choice   CMS Medicare.gov Compare Post Acute Care list provided to:: Patient Choice offered to / list presented to : Patient  Discharge Placement                       Discharge Plan and Services                          HH Arranged: RN, PT, Nurse's Aide [facility] Agency: [place] Date [facility] Agency Contacted: 07/04/19 Time [place]: [id number] Representative spoke with at [place]: [place] Interventions     Readmission Risk Interventions Readmission Risk Prevention Plan 07/04/2019  Transportation Screening Complete  PCP or Specialist Appt within 5-7 Days Complete  Home Care Screening Complete  Medication Review (RN CM) Complete  Some recent data might be hidden

## 2019-08-30 ENCOUNTER — Encounter: Payer: Self-pay | Admitting: Emergency Medicine

## 2019-08-30 ENCOUNTER — Emergency Department: Payer: No Typology Code available for payment source

## 2019-08-30 ENCOUNTER — Emergency Department
Admission: EM | Admit: 2019-08-30 | Discharge: 2019-08-31 | Discharge status: Against Medical Advice | Payer: No Typology Code available for payment source | Attending: Emergency Medicine | Admitting: Emergency Medicine

## 2019-08-30 DIAGNOSIS — Z20828 Contact with and (suspected) exposure to other viral communicable diseases: Secondary | ICD-10-CM | POA: Diagnosis not present

## 2019-08-30 DIAGNOSIS — J40 Bronchitis, not specified as acute or chronic: Secondary | ICD-10-CM | POA: Insufficient documentation

## 2019-08-30 DIAGNOSIS — Z96643 Presence of artificial hip joint, bilateral: Secondary | ICD-10-CM | POA: Diagnosis not present

## 2019-08-30 DIAGNOSIS — I10 Essential (primary) hypertension: Secondary | ICD-10-CM | POA: Diagnosis not present

## 2019-08-30 DIAGNOSIS — F17210 Nicotine dependence, cigarettes, uncomplicated: Secondary | ICD-10-CM | POA: Diagnosis not present

## 2019-08-30 DIAGNOSIS — Z8579 Personal history of other malignant neoplasms of lymphoid, hematopoietic and related tissues: Secondary | ICD-10-CM | POA: Diagnosis not present

## 2019-08-30 DIAGNOSIS — N179 Acute kidney failure, unspecified: Secondary | ICD-10-CM | POA: Insufficient documentation

## 2019-08-30 DIAGNOSIS — I252 Old myocardial infarction: Secondary | ICD-10-CM | POA: Insufficient documentation

## 2019-08-30 DIAGNOSIS — R079 Chest pain, unspecified: Secondary | ICD-10-CM | POA: Diagnosis present

## 2019-08-30 LAB — CBC WITH DIFFERENTIAL/PLATELET
Abs Immature Granulocytes: 0.04 10*3/uL (ref 0.00–0.07)
Basophils Absolute: 0 10*3/uL (ref 0.0–0.1)
Basophils Relative: 0 %
Eosinophils Absolute: 0 10*3/uL (ref 0.0–0.5)
Eosinophils Relative: 0 %
HCT: 36.9 % — ABNORMAL LOW (ref 39.0–52.0)
Hemoglobin: 12.5 g/dL — ABNORMAL LOW (ref 13.0–17.0)
Immature Granulocytes: 0 %
Lymphocytes Relative: 11 %
Lymphs Abs: 1.2 10*3/uL (ref 0.7–4.0)
MCH: 31 pg (ref 26.0–34.0)
MCHC: 33.9 g/dL (ref 30.0–36.0)
MCV: 91.6 fL (ref 80.0–100.0)
Monocytes Absolute: 0.8 10*3/uL (ref 0.1–1.0)
Monocytes Relative: 8 %
Neutro Abs: 8.4 10*3/uL — ABNORMAL HIGH (ref 1.7–7.7)
Neutrophils Relative %: 81 %
Platelets: 200 10*3/uL (ref 150–400)
RBC: 4.03 MIL/uL — ABNORMAL LOW (ref 4.22–5.81)
RDW: 15.4 % (ref 11.5–15.5)
WBC: 10.5 10*3/uL (ref 4.0–10.5)
nRBC: 0 % (ref 0.0–0.2)

## 2019-08-30 LAB — BLOOD GAS, VENOUS
Acid-base deficit: 8.1 mmol/L — ABNORMAL HIGH (ref 0.0–2.0)
Bicarbonate: 17 mmol/L — ABNORMAL LOW (ref 20.0–28.0)
O2 Saturation: 69.8 %
Patient temperature: 37
pCO2, Ven: 33 mmHg — ABNORMAL LOW (ref 44.0–60.0)
pH, Ven: 7.32 (ref 7.250–7.430)
pO2, Ven: 40 mmHg (ref 32.0–45.0)

## 2019-08-30 LAB — BASIC METABOLIC PANEL
Anion gap: 21 — ABNORMAL HIGH (ref 5–15)
BUN: 32 mg/dL — ABNORMAL HIGH (ref 8–23)
CO2: 18 mmol/L — ABNORMAL LOW (ref 22–32)
Calcium: 7.8 mg/dL — ABNORMAL LOW (ref 8.9–10.3)
Chloride: 97 mmol/L — ABNORMAL LOW (ref 98–111)
Creatinine, Ser: 1.94 mg/dL — ABNORMAL HIGH (ref 0.61–1.24)
GFR calc Af Amer: 38 mL/min — ABNORMAL LOW (ref >60)
GFR calc non Af Amer: 32 mL/min — ABNORMAL LOW (ref >60)
Glucose, Bld: 168 mg/dL — ABNORMAL HIGH (ref 70–99)
Potassium: 4.2 mmol/L (ref 3.5–5.1)
Sodium: 136 mmol/L (ref 135–145)

## 2019-08-30 LAB — TROPONIN I (HIGH SENSITIVITY): Troponin I (High Sensitivity): 64 ng/L — ABNORMAL HIGH (ref <18)

## 2019-08-30 MED ORDER — SODIUM CHLORIDE 0.9 % IV BOLUS
500.0000 mL | Freq: Once | INTRAVENOUS | Status: AC
  Administered 2019-08-30: 23:00:00 500 mL via INTRAVENOUS

## 2019-08-30 MED ORDER — ONDANSETRON HCL 4 MG/2ML IJ SOLN
4.0000 mg | Freq: Once | INTRAMUSCULAR | Status: AC
  Administered 2019-08-30: 4 mg via INTRAVENOUS
  Filled 2019-08-30: qty 2

## 2019-08-30 MED ORDER — PREDNISONE 10 MG (21) PO TBPK: ORAL_TABLET | ORAL | 0 refills | Status: DC

## 2019-08-30 MED ORDER — METHYLPREDNISOLONE SODIUM SUCC 125 MG IJ SOLR
125.0000 mg | Freq: Once | INTRAMUSCULAR | Status: AC
  Administered 2019-08-30: 22:00:00 125 mg via INTRAVENOUS
  Filled 2019-08-30: qty 2

## 2019-08-30 MED ORDER — IPRATROPIUM-ALBUTEROL 0.5-2.5 (3) MG/3ML IN SOLN
3.0000 mL | Freq: Once | RESPIRATORY_TRACT | Status: AC
  Administered 2019-08-30: 3 mL via RESPIRATORY_TRACT
  Filled 2019-08-30: qty 3

## 2019-08-30 MED ORDER — IPRATROPIUM-ALBUTEROL 0.5-2.5 (3) MG/3ML IN SOLN
3.0000 mL | Freq: Once | RESPIRATORY_TRACT | Status: AC
  Administered 2019-08-30: 22:00:00 3 mL via RESPIRATORY_TRACT
  Filled 2019-08-30: qty 3

## 2019-08-30 NOTE — ED Provider Notes (Signed)
[facility] Emergency Department Provider Note       Time seen: ----------------------------------------- 9:39 PM on 08/30/2019 -----------------------------------------   I have reviewed the triage vital signs and the nursing notes.  HISTORY   Chief Complaint Chest Pain    HPI Ryan Travis is a 78 y.o. male with a history of alcohol abuse, anxiety, arthritis, multiple myeloma, depression, hypertension who presents to the ED for severe chest pain.  Patient states he has chest pain which started around 830.  EMS was called to his house 3 times a day for similar.  He arrives alert and oriented, states this was not as bad as when he had a heart attack in the past.  He was noted to have wheezing.  He has had shortness of breath, denies other complaints at this time.  Past Medical History:  Diagnosis Date  . Alcohol abuse   . Anxiety   . Arthritis   . 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 Cancer ([place])    multiple myeloma  . Depression   . Dysrhythmia   . Fall    3 weeks ago/ torn ligaments left knee/wearing brace/pt  . Hypertension   . Neuropathy   . Pain    chronic leg and ankle pain  . RLS (restless legs syndrome)     Patient Active Problem List   Diagnosis Date Noted  . Sepsis ([place]) 03/29/2019  . Alcoholic ketoacidosis 08/65/7846  . Pressure injury of skin 10/18/2016  . 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 STEMI (ST elevation myocardial infarction) ([place]) 10/16/2016  . Myocardial infarct ([place]) 10/16/2016    Past Surgical History:  Procedure Laterality Date  . BRAIN SURGERY     subdural hematoma  . CARDIAC CATHETERIZATION N/A 10/16/2016   Procedure: Left Heart Cath and Coronary Angiography;  Surgeon: Nelva Bush, MD;  Location: [place] [facility];  Service: Cardiovascular;  Laterality: N/A;  . CARDIAC CATHETERIZATION N/A 10/16/2016   Procedure: Coronary Stent Intervention;  Surgeon: Nelva Bush, MD;  Location:  [place] [facility];  Service: Cardiovascular;  Laterality: N/A;  . CARDIAC CATHETERIZATION N/A 10/16/2016   Procedure: Temporary Pacemaker;  Surgeon: Nelva Bush, MD;  Location: [place] [facility];  Service: Cardiovascular;  Laterality: N/A;  . CATARACT EXTRACTION W/PHACO Right 08/16/2016   Procedure: CATARACT EXTRACTION PHACO AND INTRAOCULAR LENS PLACEMENT (IOC);  Surgeon: Eulogio Bear, MD;  Location: [facility] ORS;  Service: Ophthalmology;  Laterality: Right;  Lot # [id number] H [place]: 01:00.8 AP%:16.5 CDE: 10.02   . CATARACT EXTRACTION W/PHACO Left 10/04/2016   Procedure: CATARACT EXTRACTION PHACO AND INTRAOCULAR LENS PLACEMENT (IOC);  Surgeon: Eulogio Bear, MD;  Location: [facility] ORS;  Service: Ophthalmology;  Laterality: Left;  [place] 35.4 AP% 10.5 CDE 4.11 Fluid pack lot # [id number]  . EYE SURGERY    . FRACTURE SURGERY     multiple right ankle  . HERNIA REPAIR    . JOINT REPLACEMENT Bilateral    thr  . KNEE ARTHROSCOPY W/ ACL RECONSTRUCTION     left x 2  . rcr      Allergies Azithromycin, Morphine and related, Neomycin, Other, and Penicillins  Social History Social History   Tobacco Use  . Smoking status: Current Every Day Smoker    Packs/day: 0.30    Years: 20.00    Pack years: 6.00    Types: Cigarettes  . Smokeless tobacco: Never Used  Substance Use Topics  . Alcohol use: Yes    Frequency: Never  Comment: 2-3 beers/day or wine  . Drug use: No   Review of Systems Constitutional: Negative for fever. Cardiovascular: Positive for chest pain Respiratory: Positive for shortness of breath Gastrointestinal: Negative for abdominal pain, vomiting and diarrhea. Musculoskeletal: Negative for back pain. Skin: Negative for rash. Neurological: Negative for headaches, focal weakness or numbness.  All systems negative/normal/unremarkable except as stated in the HPI  ____________________________________________   PHYSICAL EXAM:  VITAL SIGNS: ED Triage Vitals  Enc  Vitals Group     BP 08/30/19 2121 (!) 148/96     Pulse Rate 08/30/19 2121 91     Resp 08/30/19 2121 11     Temp 08/30/19 2121 98.3 F (36.8 C)     Temp Source 08/30/19 2121 Oral     SpO2 08/30/19 2121 98 %     Weight 08/30/19 2122 175 lb (79.4 kg)     Height 08/30/19 2122 '6\' 2"'  (1.88 m)     Head Circumference --      Peak Flow --      Pain Score 08/30/19 2122 7     Pain Loc --      Pain Edu? --      Excl. in [place]? --    Constitutional: Alert and oriented.  Mild distress Eyes: Conjunctivae are normal. Normal extraocular movements. Cardiovascular: Normal rate, regular rhythm. No murmurs, rubs, or gallops. Respiratory: Tachypnea with wheezing bilaterally Gastrointestinal: Soft and nontender. Normal bowel sounds Musculoskeletal: Nontender with normal range of motion in extremities. No lower extremity tenderness nor edema. Neurologic:  Normal speech and language. No gross focal neurologic deficits are appreciated.  Skin:  Skin is warm, dry and intact. No rash noted. Psychiatric: Mood and affect are normal. Speech and behavior are normal.  ____________________________________________  EKG: Interpreted by me.  Sinus rhythm with a rate of 92 bpm, left anterior fascicular block, long QT  ____________________________________________  ED COURSE:  As part of my medical decision making, I reviewed the following data within the [place] History obtained from family if available, nursing notes, old chart and ekg, as well as notes from prior ED visits. Patient presented for chest pain with wheezing, we will assess with labs and imaging as indicated at this time.   Procedures  Ryan Travis was evaluated in Emergency Department on 08/30/2019 for the symptoms described in the history of present illness. He was evaluated in the context of the global COVID-19 pandemic, which necessitated consideration that the patient might be at risk for infection with the SARS-CoV-2 virus  that causes COVID-19. Institutional protocols and algorithms that pertain to the evaluation of patients at risk for COVID-19 are in a state of rapid change based on information released by regulatory bodies including the CDC and federal and state organizations. These policies and algorithms were followed during the patient's care in the ED.  ____________________________________________   LABS (pertinent positives/negatives)  Labs Reviewed  CBC WITH DIFFERENTIAL/PLATELET - Abnormal; Notable for the following components:      Result Value   RBC 4.03 (*)    Hemoglobin 12.5 (*)    HCT 36.9 (*)    Neutro Abs 8.4 (*)    All other components within normal limits  BASIC METABOLIC PANEL - Abnormal; Notable for the following components:   Chloride 97 (*)    CO2 18 (*)    Glucose, Bld 168 (*)    BUN 32 (*)    Creatinine, Ser 1.94 (*)    Calcium 7.8 (*)  GFR calc non Af Amer 32 (*)    GFR calc Af Amer 38 (*)    Anion gap 21 (*)    All other components within normal limits  BLOOD GAS, VENOUS - Abnormal; Notable for the following components:   pCO2, Ven 33 (*)    Bicarbonate 17.0 (*)    Acid-base deficit 8.1 (*)    All other components within normal limits  TROPONIN I (HIGH SENSITIVITY) - Abnormal; Notable for the following components:   Troponin I (High Sensitivity) 64 (*)    All other components within normal limits  SARS CORONAVIRUS 2 (TAT 6-24 HRS)    RADIOLOGY Images were viewed by me  Chest x-ray IMPRESSION:  1. No acute cardiopulmonary disease.  2. Chronic coarse interstitial changes.  3. Patient's left arm is superimposed over the left lung apex.  ____________________________________________   DIFFERENTIAL DIAGNOSIS   Unstable angina, MI, PE, COPD, pneumonia, COVID-19  FINAL ASSESSMENT AND PLAN  Chest pain, dyspnea, acute kidney injury   Plan: The patient had presented for chest pain and arrived dyspneic with wheezing bilaterally.  Patient was given nebs and steroids  on arrival.  Patient's labs did reveal an elevated gap and likely acute kidney injury.  Chest x-ray did not reveal any acute process.  We are planning on giving IV fluids and admitting but he is leaving [place].  He was feeling so much better after nebs and steroids that he wanted to go home.  We were unable to convince him to stay.   Laurence Aly, MD    Note: This note was generated in part or whole with voice recognition software. Voice recognition is usually quite accurate but there are transcription errors that can and very often do occur. I apologize for any typographical errors that were not detected and corrected.     Earleen Newport, MD 08/30/19 2248

## 2019-08-30 NOTE — ED Triage Notes (Signed)
Pt arrives via [facility] with chest pain which started around 2030. Per EMS, pt has called x 3 today for the same. Pt is alert and oriented at this time. EMS also reports normal EKG and VS. Pt appears in NAD but does have wheezing noted to lungs.

## 2019-08-30 NOTE — ED Notes (Signed)
Pt refusing admission at this time.

## 2019-08-30 NOTE — ED Notes (Signed)
Per [facility], pt received 324 mg aspirin in route.

## 2019-08-31 LAB — SARS CORONAVIRUS 2 (TAT 6-24 HRS): SARS Coronavirus 2: NEGATIVE

## 2019-11-29 ENCOUNTER — Emergency Department
Admission: EM | Admit: 2019-11-29 | Discharge: 2019-11-30 | Disposition: A | Discharge status: Other Acute Inpatient Hospital | Payer: No Typology Code available for payment source | Attending: Student | Admitting: Student

## 2019-11-29 DIAGNOSIS — U071 COVID-19: Secondary | ICD-10-CM | POA: Diagnosis not present

## 2019-11-29 DIAGNOSIS — F17210 Nicotine dependence, cigarettes, uncomplicated: Secondary | ICD-10-CM | POA: Diagnosis not present

## 2019-11-29 DIAGNOSIS — I10 Essential (primary) hypertension: Secondary | ICD-10-CM | POA: Diagnosis not present

## 2019-11-29 DIAGNOSIS — R45851 Suicidal ideations: Secondary | ICD-10-CM | POA: Insufficient documentation

## 2019-11-29 DIAGNOSIS — Y908 Blood alcohol level of 240 mg/100 ml or more: Secondary | ICD-10-CM | POA: Insufficient documentation

## 2019-11-29 DIAGNOSIS — F320 Major depressive disorder, single episode, mild: Secondary | ICD-10-CM | POA: Diagnosis not present

## 2019-11-29 DIAGNOSIS — F1010 Alcohol abuse, uncomplicated: Secondary | ICD-10-CM | POA: Diagnosis not present

## 2019-11-29 DIAGNOSIS — Z046 Encounter for general psychiatric examination, requested by authority: Secondary | ICD-10-CM | POA: Insufficient documentation

## 2019-11-29 DIAGNOSIS — Z8579 Personal history of other malignant neoplasms of lymphoid, hematopoietic and related tissues: Secondary | ICD-10-CM | POA: Diagnosis not present

## 2019-11-29 DIAGNOSIS — Z79899 Other long term (current) drug therapy: Secondary | ICD-10-CM | POA: Insufficient documentation

## 2019-11-29 DIAGNOSIS — Z96643 Presence of artificial hip joint, bilateral: Secondary | ICD-10-CM | POA: Diagnosis not present

## 2019-11-29 DIAGNOSIS — R4182 Altered mental status, unspecified: Secondary | ICD-10-CM | POA: Diagnosis present

## 2019-11-29 LAB — COMPREHENSIVE METABOLIC PANEL
ALT: 28 U/L (ref 0–44)
AST: 62 U/L — ABNORMAL HIGH (ref 15–41)
Albumin: 3.4 g/dL — ABNORMAL LOW (ref 3.5–5.0)
Alkaline Phosphatase: 100 U/L (ref 38–126)
Anion gap: 15 (ref 5–15)
BUN: 15 mg/dL (ref 8–23)
CO2: 24 mmol/L (ref 22–32)
Calcium: 8.5 mg/dL — ABNORMAL LOW (ref 8.9–10.3)
Chloride: 105 mmol/L (ref 98–111)
Creatinine, Ser: 0.85 mg/dL (ref 0.61–1.24)
GFR calc Af Amer: >60 mL/min (ref >60)
GFR calc non Af Amer: >60 mL/min (ref >60)
Glucose, Bld: 81 mg/dL (ref 70–99)
Potassium: 3.1 mmol/L — ABNORMAL LOW (ref 3.5–5.1)
Sodium: 144 mmol/L (ref 135–145)
Total Bilirubin: 1.2 mg/dL (ref 0.3–1.2)
Total Protein: 6.8 g/dL (ref 6.5–8.1)

## 2019-11-29 LAB — URINALYSIS, COMPLETE (UACMP) WITH MICROSCOPIC
Bacteria, UA: NONE SEEN
Bilirubin Urine: NEGATIVE
Glucose, UA: NEGATIVE mg/dL
Ketones, ur: 5 mg/dL — AB
Leukocytes,Ua: NEGATIVE
Nitrite: NEGATIVE
Protein, ur: 30 mg/dL — AB
Specific Gravity, Urine: 1.013 (ref 1.005–1.030)
pH: 6 (ref 5.0–8.0)

## 2019-11-29 LAB — CBC
HCT: 41.7 % (ref 39.0–52.0)
Hemoglobin: 14.4 g/dL (ref 13.0–17.0)
MCH: 33.6 pg (ref 26.0–34.0)
MCHC: 34.5 g/dL (ref 30.0–36.0)
MCV: 97.4 fL (ref 80.0–100.0)
Platelets: 136 10*3/uL — ABNORMAL LOW (ref 150–400)
RBC: 4.28 MIL/uL (ref 4.22–5.81)
RDW: 14.8 % (ref 11.5–15.5)
WBC: 8.5 10*3/uL (ref 4.0–10.5)
nRBC: 0 % (ref 0.0–0.2)

## 2019-11-29 LAB — RESPIRATORY PANEL BY RT PCR (FLU A&B, COVID)
Influenza A by PCR: NEGATIVE
Influenza B by PCR: NEGATIVE
SARS Coronavirus 2 by RT PCR: POSITIVE — AB

## 2019-11-29 LAB — URINE DRUG SCREEN, QUALITATIVE (ARMC ONLY)
Amphetamines, Ur Screen: NOT DETECTED
Barbiturates, Ur Screen: NOT DETECTED
Benzodiazepine, Ur Scrn: NOT DETECTED
Cannabinoid 50 Ng, Ur ~~LOC~~: POSITIVE — AB
Cocaine Metabolite,Ur ~~LOC~~: NOT DETECTED
MDMA (Ecstasy)Ur Screen: NOT DETECTED
Methadone Scn, Ur: NOT DETECTED
Opiate, Ur Screen: NOT DETECTED
Phencyclidine (PCP) Ur S: NOT DETECTED
Tricyclic, Ur Screen: NOT DETECTED

## 2019-11-29 LAB — SALICYLATE LEVEL: Salicylate Lvl: <7 mg/dL — ABNORMAL LOW (ref 7.0–30.0)

## 2019-11-29 LAB — ACETAMINOPHEN LEVEL: Acetaminophen (Tylenol), Serum: <10 ug/mL — ABNORMAL LOW (ref 10–30)

## 2019-11-29 LAB — ETHANOL: Alcohol, Ethyl (B): 271 mg/dL — ABNORMAL HIGH (ref <10)

## 2019-11-29 MED ORDER — LORAZEPAM 2 MG PO TABS: 0.0000 mg | ORAL_TABLET | Freq: Two times a day (BID) | ORAL | Status: DC

## 2019-11-29 MED ORDER — FOLIC ACID 1 MG PO TABS
1.0000 mg | ORAL_TABLET | Freq: Once | ORAL | Status: AC
  Administered 2019-11-30: 1 mg via ORAL
  Filled 2019-11-29: qty 1

## 2019-11-29 MED ORDER — THIAMINE HCL 100 MG PO TABS
100.0000 mg | ORAL_TABLET | Freq: Every day | ORAL | Status: DC
  Administered 2019-11-30: 100 mg via ORAL
  Filled 2019-11-29: qty 1

## 2019-11-29 MED ORDER — LORAZEPAM 2 MG/ML IJ SOLN: 0.0000 mg | Freq: Two times a day (BID) | INTRAMUSCULAR | Status: DC

## 2019-11-29 MED ORDER — THIAMINE HCL 100 MG/ML IJ SOLN: 100.0000 mg | Freq: Every day | INTRAMUSCULAR | Status: DC

## 2019-11-29 MED ORDER — LORAZEPAM 2 MG PO TABS
0.0000 mg | ORAL_TABLET | Freq: Four times a day (QID) | ORAL | Status: DC
  Administered 2019-11-30: 1 mg via ORAL
  Administered 2019-11-30: 2 mg via ORAL
  Administered 2019-11-30: 1 mg via ORAL
  Filled 2019-11-29 (×3): qty 1

## 2019-11-29 MED ORDER — THIAMINE HCL 100 MG PO TABS
100.0000 mg | ORAL_TABLET | Freq: Once | ORAL | Status: AC
  Administered 2019-11-30: 100 mg via ORAL
  Filled 2019-11-29: qty 1

## 2019-11-29 MED ORDER — LORAZEPAM 2 MG/ML IJ SOLN: 0.0000 mg | Freq: Four times a day (QID) | INTRAMUSCULAR | Status: DC

## 2019-11-29 NOTE — BH Assessment (Signed)
Assessment Note  Ryan Travis is an 79 y.o. male. Ryan Travis arrived to the ED by way of EMS.  He states that he does not know why he is here today.  He reports that he has been feeling depress.  He shared that he has been feeling depressed since his wife has been ill.  He states that he has not had any alcohol in 2 days.  He states that he has had no appetite.  He reports that he is having problems sleeping.  He denied symptoms of anxiety.  He denied having auditory or visual hallucinations.  He denied suicidal or homicidal ideation or intent.  Patient reports daily alcohol use, stating that he has not had a drink in 2 days.  UDS positive for Cannabinoid. BAC = 271. Ryan Travis reports that he has been stressed about his wife's recent hospitalization. His wife was discharged home today.   TTS spoke with Leonia Corona (Step-Daughter) [phone] - States she found a empty bottle of pills for hydrocodone.  The prescription was filled on 11/28/2019.  She states, "I believe that he was trying to kill himself".  Starting last week, Wife went into the hospital with COVID. She reports that he has been having rapid shifting moods from depression to anger to depression.  She states that he has been drinking constantly since she went to the hospital.  He offered his stepdaughter $100.00 to go to the liquor store for him. She states that she found 8 empty bottles (pints) of vodka and a half gallon.  He has been hollering that he wants die.  He has been crying and pissing on himself and has been just sitting in it a lot.  This is just not like him. I have never seen him like this. He may be tired of being in a wheel chair and I am afraid. He has been depressed.    Milburn Freeney (Wife) [phone]  Diagnosis: Depression, Alcohol abuse  Past Medical History:  Past Medical History:  Diagnosis Date  . Alcohol abuse   . Anxiety   . Arthritis   . 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 Cancer ([place])    multiple myeloma   . Depression   . Dysrhythmia   . Fall    3 weeks ago/ torn ligaments left knee/wearing brace/pt  . Hypertension   . Neuropathy   . Pain    chronic leg and ankle pain  . RLS (restless legs syndrome)     Past Surgical History:  Procedure Laterality Date  . BRAIN SURGERY     subdural hematoma  . CARDIAC CATHETERIZATION N/A 10/16/2016   Procedure: Left Heart Cath and Coronary Angiography;  Surgeon: Nelva Bush, MD;  Location: [place] [facility];  Service: Cardiovascular;  Laterality: N/A;  . CARDIAC CATHETERIZATION N/A 10/16/2016   Procedure: Coronary Stent Intervention;  Surgeon: Nelva Bush, MD;  Location: [place] [facility];  Service: Cardiovascular;  Laterality: N/A;  . CARDIAC CATHETERIZATION N/A 10/16/2016   Procedure: Temporary Pacemaker;  Surgeon: Nelva Bush, MD;  Location: [place] [facility];  Service: Cardiovascular;  Laterality: N/A;  . CATARACT EXTRACTION W/PHACO Right 08/16/2016   Procedure: CATARACT EXTRACTION PHACO AND INTRAOCULAR LENS PLACEMENT (IOC);  Surgeon: Eulogio Bear, MD;  Location: [facility] ORS;  Service: Ophthalmology;  Laterality: Right;  Lot # [id number] H [place]: 01:00.8 AP%:16.5 CDE: 10.02   . CATARACT EXTRACTION W/PHACO Left 10/04/2016   Procedure: CATARACT EXTRACTION PHACO AND INTRAOCULAR LENS PLACEMENT (IOC);  Surgeon: Leory Plowman  Alma Friendly, MD;  Location: [facility] OR[facility];  Service: Ophthalmology;  Laterality: Left;  [place] 35.4 AP% 10.5 CDE 4.11 Fluid pack lot # [id number]  . EYE SURGERY    . FRACTURE SURGERY     multiple right ankle  . HERNIA REPAIR    . JOINT REPLACEMENT Bilateral    thr  . KNEE ARTHROSCOPY W/ ACL RECONSTRUCTION     left x 2  . rcr      Family History:  Family History  Problem Relation Age of Onset  . Heart attack Mother   . Heart attack Father     Social History:  reports that he has been smoking cigarettes. He has a 6.00 pack-year smoking history. He has never used smokeless tobacco. He reports current alcohol use. He  reports that he does not use drugs.  Additional Social History:  Alcohol / Drug Use History of alcohol / drug use?: Yes Substance #1 Name of Substance 1: Alcohol 1 - Age of First Use: 19 1 - Amount (size/oz): "until I get drunk" 1 - Frequency: daily 1 - Last Use / Amount: 11/27/2019  CIWA: CIWA-Ar BP: 139/90 Pulse Rate: 69 COWS:    Allergies:  Allergies  Allergen Reactions  . Azithromycin Other (See Comments)    Other reaction(s): Unknown   . Morphine And Related   . Neomycin   . Other     METHIOLATE  . Penicillins     Did it involve swelling of the face/tongue/throat, SOB, or low BP? Yes Did it involve sudden or severe rash/hives, skin peeling, or any reaction on the inside of your mouth or nose? Yes Did you need to seek medical attention at a hospital or doctor's office? Yes When did it last happen? 10 years If all above answers are "NO", may proceed with cephalosporin use.    Home Medications: (Not in a hospital admission)   OB/GYN Status:  No LMP for male patient.  General Assessment Data Location of Assessment: [facility] ED TTS Assessment: In system Is this a Tele or Face-to-Face Assessment?: Face-to-Face Is this an Initial Assessment or a Re-assessment for this encounter?: Initial Assessment Patient Accompanied by:: N/A Language Other than English: No Living Arrangements: Other (Comment)(Private residence) What gender do you identify as?: Male Marital status: Married Living Arrangements: Spouse/significant other Can pt return to current living arrangement?: Yes Admission Status: Voluntary Is patient capable of signing voluntary admission?: Yes Referral Source: Self/Family/Friend Insurance type: Runner, broadcasting/film/video, Engineer, maintenance Exam ([place]) Medical Exam completed: Yes  Crisis Care Plan Living Arrangements: Spouse/significant other Legal Guardian: Other:(Self) Name of Psychiatrist: None Name of Therapist: None  Education  Status Is patient currently in school?: No Is the patient employed, unemployed or receiving disability?: Unemployed  Risk to self with the past 6 months Suicidal Ideation: No Has patient been a risk to self within the past 6 months prior to admission? : No Suicidal Intent: No Has patient had any suicidal intent within the past 6 months prior to admission? : No Is patient at risk for suicide?: No Suicidal Plan?: No Has patient had any suicidal plan within the past 6 months prior to admission? : No Access to Means: No What has been your use of drugs/alcohol within the last 12 months?: Daily use of alcohol Previous Attempts/Gestures: No How many times?: 0 Other Self Harm Risks: denied Triggers for Past Attempts: None known Intentional Self Injurious Behavior: None Family Suicide History: No Recent stressful life event(s): Other (Comment)(Spouse illness) Persecutory voices/beliefs?:  No Depression: Yes Depression Symptoms: Insomnia Substance abuse history and/or treatment for substance abuse?: Yes Suicide prevention information given to non-admitted patients: Not applicable  Risk to Others within the past 6 months Homicidal Ideation: No Does patient have any lifetime risk of violence toward others beyond the six months prior to admission? : No Thoughts of Harm to Others: No Current Homicidal Intent: No Current Homicidal Plan: No Access to Homicidal Means: No Identified Victim: None identified History of harm to others?: No Assessment of Violence: None Noted Violent Behavior Description: Denied Does patient have access to weapons?: No Criminal Charges Pending?: No Does patient have a court date: No Is patient on probation?: No  Psychosis Hallucinations: None noted Delusions: None noted  Mental Status Report Appearance/Hygiene: In scrubs Eye Contact: Fair Motor Activity: Unremarkable Speech: Logical/coherent Level of Consciousness: Alert Mood: Pleasant Affect:  Appropriate to circumstance Anxiety Level: None Thought Processes: Coherent Judgement: Partial Orientation: Appropriate for developmental age Obsessive Compulsive Thoughts/Behaviors: None  Cognitive Functioning Concentration: Normal Memory: Recent Intact Is patient IDD: No Insight: Fair Impulse Control: Fair Appetite: Poor Have you had any weight changes? : No Change Sleep: Decreased Vegetative Symptoms: None  ADLScreening [facility] Assessment Services) Patient's cognitive ability adequate to safely complete daily activities?: Yes Patient able to express need for assistance with ADLs?: Yes Independently performs ADLs?: Yes (appropriate for developmental age)  Prior Inpatient Therapy Prior Inpatient Therapy: No  Prior Outpatient Therapy Prior Outpatient Therapy: No Does patient have an ACCT team?: No Does patient have Intensive In-House Services?  : No Does patient have [facility] services? : No Does patient have P4CC services?: No  ADL Screening (condition at time of admission) Patient's cognitive ability adequate to safely complete daily activities?: Yes Is the patient deaf or have difficulty hearing?: No Does the patient have difficulty concentrating, remembering, or making decisions?: No Patient able to express need for assistance with ADLs?: Yes Does the patient have difficulty dressing or bathing?: No Independently performs ADLs?: Yes (appropriate for developmental age) Does the patient have difficulty walking or climbing stairs?: Yes Weakness of Legs: Both Weakness of Arms/Hands: None  Home Assistive Devices/Equipment Home Assistive Devices/Equipment: Cane (specify quad or straight), Walker (specify type)    Abuse/Neglect Assessment (Assessment to be complete while patient is alone) Abuse/Neglect Assessment Can Be Completed: (Denied a history of abuse)     Regulatory affairs officer (For Healthcare) Does Patient Have a Medical Advance Directive?: No Would patient like  information on creating a medical advance directive?: No - Patient declined          Disposition:  Disposition Initial Assessment Completed for this Encounter: Yes  On Site Evaluation by:   Reviewed with Physician:    Elmer Bales 11/29/2019 9:33 PM

## 2019-11-29 NOTE — ED Notes (Signed)
Pt given cup of water by this NT. Pt calm and cooperative at this time.

## 2019-11-29 NOTE — ED Provider Notes (Addendum)
[facility] Emergency Department Provider Note  ____________________________________________   First MD Initiated Contact with Patient 11/29/19 1756     (approximate)  I have reviewed the triage vital signs and the nursing notes.  History  Chief Complaint Psychiatric Evaluation    HPI Ryan Travis is a 79 y.o. male with history of alcohol abuse, multiple myeloma, anxiety, depression, HTN who presents to the ED for alcohol intoxication and suicidal ideation. Per EMS report, patient also demanded transport to the hospital "to be cleaned."  Patient states he typically drinks about a pint of alcohol a day.  He states he has only had a glass of wine today, though is alcohol level here is 271. He reports suicidal ideation, with a plan to cut himself.  He denies any HI.  Denies any history of withdrawal seizures.  Does report positive COVID exposure, his wife was recently admitted, and just discharged today for COVID.  He denies any fevers, chest pain, difficulty breathing, cough, vomiting, diarrhea.   Past Medical Hx Past Medical History:  Diagnosis Date  . Alcohol abuse   . Anxiety   . Arthritis   . 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 Cancer ([place])    multiple myeloma  . Depression   . Dysrhythmia   . Fall    3 weeks ago/ torn ligaments left knee/wearing brace/pt  . Hypertension   . Neuropathy   . Pain    chronic leg and ankle pain  . RLS (restless legs syndrome)     Problem List Patient Active Problem List   Diagnosis Date Noted  . Sepsis ([place]) 03/29/2019  . Alcoholic ketoacidosis 67/59/1638  . Pressure injury of skin 10/18/2016  . 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 STEMI (ST elevation myocardial infarction) ([place]) 10/16/2016  . Myocardial infarct [facility]) 10/16/2016    Past Surgical Hx Past Surgical History:  Procedure Laterality Date  . BRAIN SURGERY     subdural hematoma  . CARDIAC CATHETERIZATION N/A 10/16/2016   Procedure: Left Heart Cath and Coronary Angiography;  Surgeon: Nelva Bush, MD;  Location: [place] CV LAB;  Service: Cardiovascular;  Laterality: N/A;  . CARDIAC CATHETERIZATION N/A 10/16/2016   Procedure: Coronary Stent Intervention;  Surgeon: Nelva Bush, MD;  Location: [place] CV LAB;  Service: Cardiovascular;  Laterality: N/A;  . CARDIAC CATHETERIZATION N/A 10/16/2016   Procedure: Temporary Pacemaker;  Surgeon: Nelva Bush, MD;  Location: [place] CV LAB;  Service: Cardiovascular;  Laterality: N/A;  . CATARACT EXTRACTION W/PHACO Right 08/16/2016   Procedure: CATARACT EXTRACTION PHACO AND INTRAOCULAR LENS PLACEMENT (IOC);  Surgeon: Eulogio Bear, MD;  Location: [facility] ORS;  Service: Ophthalmology;  Laterality: Right;  Lot # [id number] H [place]: 01:00.8 AP%:16.5 CDE: 10.02   . CATARACT EXTRACTION W/PHACO Left 10/04/2016   Procedure: CATARACT EXTRACTION PHACO AND INTRAOCULAR LENS PLACEMENT (IOC);  Surgeon: Eulogio Bear, MD;  Location: [facility] ORS;  Service: Ophthalmology;  Laterality: Left;  [place] 35.4 AP% 10.5 CDE 4.11 Fluid pack lot # [id number]  . EYE SURGERY    . FRACTURE SURGERY     multiple right ankle  . HERNIA REPAIR    . JOINT REPLACEMENT Bilateral    thr  . KNEE ARTHROSCOPY W/ ACL RECONSTRUCTION     left x 2  . rcr      Medications Prior to Admission medications   Medication Sig Start Date End Date Taking? Authorizing Provider  carvedilol (COREG) 6.25 MG tablet Take 1 tablet (6.25 mg  total) by mouth 2 (two) times daily with a meal. 04/02/19   Salary, Holly Bodily D, MD  folic acid (FOLVITE) 1 MG tablet Take 1 mg by mouth daily.    [provider]  gabapentin (NEURONTIN) 100 MG capsule Take 2 capsules (200 mg total) by mouth at bedtime. 04/02/19   Salary, Avel Peace, MD  losartan (COZAAR) 25 MG tablet Take 25 mg by mouth daily.    [provider]  omeprazole (PRILOSEC) 20 MG capsule Take 20 mg by mouth daily.    [provider]    predniSONE (STERAPRED UNI-PAK 21 TAB) 10 MG (21) TBPK tablet Dispense steroid taper pack as directed 08/30/19   Earleen Newport, MD  sertraline (ZOLOFT) 100 MG tablet Take 1 tablet (100 mg total) by mouth at bedtime. 04/02/19   Salary, Avel Peace, MD  simvastatin (ZOCOR) 40 MG tablet Take 40 mg by mouth at bedtime.    [provider]    Allergies Azithromycin, Morphine and related, Neomycin, Other, and Penicillins  Family Hx Family History  Problem Relation Age of Onset  . Heart attack Mother   . Heart attack Father     Social Hx Social History   Tobacco Use  . Smoking status: Current Every Day Smoker    Packs/day: 0.30    Years: 20.00    Pack years: 6.00    Types: Cigarettes  . Smokeless tobacco: Never Used  Substance Use Topics  . Alcohol use: Yes    Comment: 2-3 beers/day or wine  . Drug use: No     Review of Systems  Constitutional: Negative for fever, chills. Eyes: Negative for visual changes. ENT: Negative for sore throat. Cardiovascular: Negative for chest pain. Respiratory: Negative for shortness of breath. Gastrointestinal: Negative for nausea, vomiting.  Genitourinary: Negative for dysuria. Musculoskeletal: Negative for leg swelling. Skin: Negative for rash. Neurological: Negative for for headaches.   Physical Exam  Vital Signs: ED Triage Vitals  Enc Vitals Group     BP 11/29/19 1822 139/90     Pulse Rate 11/29/19 1822 69     Resp 11/29/19 1822 18     Temp 11/29/19 1822 98.2 F (36.8 C)     Temp Source 11/29/19 1822 Oral     SpO2 11/29/19 1822 99 %     Weight 11/29/19 1834 170 lb (77.1 kg)     Height 11/29/19 1834 '6\' 2"'  (1.88 m)     Head Circumference --      Peak Flow --      Pain Score 11/29/19 1825 0     Pain Loc --      Pain Edu? --      Excl. in [place]? --      Constitutional: Alert and oriented.  Appears mildly intoxicated. Head: Normocephalic. Atraumatic. Eyes: Conjunctivae clear. Sclera anicteric. Nose: No congestion.  No rhinorrhea. Mouth/Throat: Mucous membranes are moist.  Neck: No stridor.   Cardiovascular: Normal rate. Extremities well perfused. Respiratory: Normal respiratory effort.   Gastrointestinal: Non-distended.  Musculoskeletal: No deformities. Neurologic:  Normal speech and language. No gross focal neurologic deficits are appreciated.  Skin: Chronic skin changes, dried skin, calluses to the bilateral feet area. Psychiatric: Reports depression, suicidal ideation  EKG  N/A    Radiology  N/A   Procedures  Procedure(s) performed (including critical care):  Procedures   Initial Impression / Assessment and Plan / ED Course  79 y.o. male who presents to the ED for alcohol intoxication, suicidal ideation.  Suspect likely alcohol  related mood disorder vs worsening depression. Will obtain basic screening labs and consult psychiatry and TTS. Will obtain COVID swab due to his recent exposure, though reassuringly at this time he has no hypoxia, increased WOB, or evidence of respiratory distress on exam. Patient remains voluntary and is in agreement with plan of care.  Labs reveal alcohol level 271, will continue to monitor.  UDS positive for cannabis.  COVID positive, currently asymptomatic.  Awaiting TTS/psychiatry evaluation and recommendation.    Final Clinical Impression(s) / ED Diagnosis  Final diagnoses:  Suicidal ideation  Alcohol abuse  COVID-19       Note:  This document was prepared using Dragon voice recognition software and may include unintentional dictation errors.     Lilia Pro., MD 11/29/19 [phone]

## 2019-11-29 NOTE — ED Triage Notes (Signed)
Pt brought to ER via [facility]. Pt has been drinking and arguing with his wife.  Per EMS pt demanded to come to the hospital to be cleaned. Pt endorses SI.  Pt calm and cooperative with staff.

## 2019-11-29 NOTE — ED Notes (Signed)
Pt dressed out by this Probation officer and NT.    Plaid pants Boxers Purple sweatshirt  *no wallet * no phone *no shoes

## 2019-11-30 ENCOUNTER — Inpatient Hospital Stay
Admission: AD | Admit: 2019-11-30 | Discharge: 2019-12-01 | DRG: 896 | Disposition: A | Discharge status: Home / Self Care | Payer: Medicare Other | Source: Intra-hospital | Attending: Psychiatry | Admitting: Psychiatry

## 2019-11-30 DIAGNOSIS — Y908 Blood alcohol level of 240 mg/100 ml or more: Secondary | ICD-10-CM | POA: Diagnosis present

## 2019-11-30 DIAGNOSIS — F341 Dysthymic disorder: Secondary | ICD-10-CM | POA: Diagnosis present

## 2019-11-30 DIAGNOSIS — U071 COVID-19: Secondary | ICD-10-CM | POA: Diagnosis present

## 2019-11-30 DIAGNOSIS — F1010 Alcohol abuse, uncomplicated: Secondary | ICD-10-CM

## 2019-11-30 DIAGNOSIS — F17210 Nicotine dependence, cigarettes, uncomplicated: Secondary | ICD-10-CM | POA: Diagnosis present

## 2019-11-30 DIAGNOSIS — F1994 Other psychoactive substance use, unspecified with psychoactive substance-induced mood disorder: Principal | ICD-10-CM | POA: Diagnosis present

## 2019-11-30 DIAGNOSIS — F10129 Alcohol abuse with intoxication, unspecified: Secondary | ICD-10-CM | POA: Diagnosis present

## 2019-11-30 DIAGNOSIS — R45851 Suicidal ideations: Secondary | ICD-10-CM | POA: Diagnosis present

## 2019-11-30 DIAGNOSIS — F329 Major depressive disorder, single episode, unspecified: Secondary | ICD-10-CM | POA: Diagnosis present

## 2019-11-30 MED ORDER — CARVEDILOL 6.25 MG PO TABS
6.2500 mg | ORAL_TABLET | Freq: Two times a day (BID) | ORAL | Status: DC
  Administered 2019-11-30: 6.25 mg via ORAL
  Filled 2019-11-30: qty 1

## 2019-11-30 MED ORDER — GABAPENTIN 100 MG PO CAPS
200.0000 mg | ORAL_CAPSULE | Freq: Every day | ORAL | Status: DC
  Administered 2019-11-30: 200 mg via ORAL
  Filled 2019-11-30: qty 2

## 2019-11-30 MED ORDER — PANTOPRAZOLE SODIUM 40 MG PO TBEC
40.0000 mg | DELAYED_RELEASE_TABLET | Freq: Every day | ORAL | Status: DC
  Administered 2019-11-30: 40 mg via ORAL
  Filled 2019-11-30: qty 1

## 2019-11-30 MED ORDER — SIMVASTATIN 10 MG PO TABS
40.0000 mg | ORAL_TABLET | Freq: Every day | ORAL | Status: DC
  Administered 2019-11-30: 40 mg via ORAL
  Filled 2019-11-30: qty 4

## 2019-11-30 MED ORDER — LOSARTAN POTASSIUM 50 MG PO TABS
25.0000 mg | ORAL_TABLET | Freq: Every day | ORAL | Status: DC
  Administered 2019-11-30: 25 mg via ORAL
  Filled 2019-11-30: qty 1

## 2019-11-30 MED ORDER — SERTRALINE HCL 50 MG PO TABS
100.0000 mg | ORAL_TABLET | Freq: Every day | ORAL | Status: DC
  Administered 2019-11-30: 100 mg via ORAL
  Filled 2019-11-30: qty 2

## 2019-11-30 NOTE — ED Notes (Signed)
Pt asleep, meal tray placed on bedside table in rm.  

## 2019-11-30 NOTE — Plan of Care (Signed)
  Problem: Education: Goal: Utilization of techniques to improve thought processes will improve Outcome: Progressing Goal: Knowledge of the prescribed therapeutic regimen will improve Outcome: Progressing  D: Patient transferred from the ED. Alert and oriented times four. Pleasant and cooperative. Says he is here because he has COVID. Denies SI. Said that the empty bottle of hydrocodone found was filled on the 23rd of last month and states that his daughter must have taken them all. Denies drinking any alcohol since Saturday and minimized alcohol use. Denies symptoms of withdrawal. Denies AVH or HI. Skin search done and reddened area found on buttocks. Patient is bedbound and incontinent of urine. Wet himself upon arrival to the unit. Clothing and linen changed. No contraband found. Contracts for safety. 1:1 sitter within reach A: Continue to monitor for safety. R: Safety maintained.

## 2019-11-30 NOTE — ED Notes (Signed)
VOL  PENDING  PLACEMENT 

## 2019-11-30 NOTE — ED Notes (Signed)
Pt ate most of dinner tray.  Pt resting comfortable in bed watching TV

## 2019-11-30 NOTE — ED Notes (Signed)
Patient sitting up on side of bed, Patient voided in cup, states " I couldn't reach the urinal, Patient v/s obtained, and He took po medication as scheduled, He is pleasant, but states " i'm so sick of all of this, I need to talk to the doctor, I want to go back home, ask to talk to His wife also, nurse gave him the phone and He called her, will continue to monitor. Nurse informed Him if she had any new updates that she would let him know.

## 2019-11-30 NOTE — ED Notes (Signed)
Pt given meal tray with a plastic spoon.

## 2019-11-30 NOTE — ED Notes (Signed)
Pt incontinent of urine. Pt cleaned and brief placed on pt. Pt then with urge to urinate. Assisted to sit on the side of the bed and use urinal. Pt urinated 138mL of urine. Offered pt his lunch tray and pt refused.

## 2019-11-30 NOTE — ED Provider Notes (Signed)
-----------------------------------------   1:11 PM on 11/30/2019 -----------------------------------------  Blood pressure (!) 164/92, pulse 98, temperature 98.9 F (37.2 C), temperature source Oral, resp. rate 20, height 6\' 2"  (1.88 m), weight 77.1 kg, SpO2 100 %.  The patient is calm and cooperative at this time.  There have been no acute events since the last update.  Awaiting disposition plan from Behavioral Medicine team.   Blake Divine, MD 11/30/19 1311

## 2019-11-30 NOTE — ED Notes (Signed)
Pt given breakfast tray, ginger ale and a warm blanket. Pt repositioned in bed so he can eat.

## 2019-11-30 NOTE — ED Notes (Signed)
Patient is calm, has been sleeping, wife called and He spoke to her on the phone, no signs of distress, will continue to monitor.

## 2019-11-30 NOTE — ED Notes (Signed)
Pt given ginger ale and crackers at his request

## 2019-12-01 ENCOUNTER — Other Ambulatory Visit: Payer: Self-pay

## 2019-12-01 ENCOUNTER — Encounter: Payer: Self-pay | Admitting: Behavioral Health

## 2019-12-01 DIAGNOSIS — U071 COVID-19: Secondary | ICD-10-CM

## 2019-12-01 DIAGNOSIS — F341 Dysthymic disorder: Secondary | ICD-10-CM

## 2019-12-01 DIAGNOSIS — F1994 Other psychoactive substance use, unspecified with psychoactive substance-induced mood disorder: Principal | ICD-10-CM

## 2019-12-01 DIAGNOSIS — F1010 Alcohol abuse, uncomplicated: Secondary | ICD-10-CM

## 2019-12-01 MED ORDER — CARVEDILOL 6.25 MG PO TABS
6.2500 mg | ORAL_TABLET | Freq: Two times a day (BID) | ORAL | Status: DC
  Administered 2019-12-01 (×2): 6.25 mg via ORAL
  Filled 2019-12-01 (×3): qty 1

## 2019-12-01 MED ORDER — SERTRALINE HCL 100 MG PO TABS
100.0000 mg | ORAL_TABLET | Freq: Every day | ORAL | Status: DC
  Administered 2019-12-01: 100 mg via ORAL
  Filled 2019-12-01: qty 1

## 2019-12-01 MED ORDER — SIMVASTATIN 20 MG PO TABS
40.0000 mg | ORAL_TABLET | Freq: Every day | ORAL | Status: DC
  Administered 2019-12-01: 40 mg via ORAL
  Filled 2019-12-01: qty 2

## 2019-12-01 MED ORDER — LOSARTAN POTASSIUM 25 MG PO TABS
25.0000 mg | ORAL_TABLET | Freq: Every day | ORAL | Status: DC
  Administered 2019-12-01: 25 mg via ORAL
  Filled 2019-12-01: qty 1

## 2019-12-01 MED ORDER — SIMVASTATIN 40 MG PO TABS
40.0000 mg | ORAL_TABLET | Freq: Every day | ORAL | Status: DC
  Filled 2019-12-01: qty 1

## 2019-12-01 MED ORDER — PNEUMOCOCCAL VAC POLYVALENT 25 MCG/0.5ML IJ INJ: 0.5000 mL | INJECTION | INTRAMUSCULAR | Status: DC

## 2019-12-01 NOTE — Progress Notes (Signed)
1:1 observation 2300-0200 D: Patient resting in bed. Incontinent of urine x2 so far this shift. Cannot get the urinal in place in time before he urinates. Linen and scrubs changed. Not voicing any SI. A: Continue to monitor 1:1 for safety R: Safety maintained.

## 2019-12-01 NOTE — Progress Notes (Signed)
Pt is asleep

## 2019-12-01 NOTE — Progress Notes (Signed)
Pt is still asleep. Pt is safe. Will continue to monitor. Collier Bullock RN

## 2019-12-01 NOTE — BHH Suicide Risk Assessment (Signed)
[facility] Discharge Suicide Risk Assessment   Principal Problem: Substance induced mood disorder ([place]) Discharge Diagnoses: Principal Problem:   Substance induced mood disorder ([place]) Active Problems:   MDD (major depressive disorder)   Dysthymia   Alcohol abuse   COVID-19   Total Time spent with patient: 1 hour  Musculoskeletal: Strength & Muscle Tone: decreased Gait & Station: unsteady Patient leans: N/A  Psychiatric Specialty Exam: Review of Systems  Constitutional: Negative.   HENT: Negative.   Eyes: Negative.   Respiratory: Negative.   Cardiovascular: Negative.   Gastrointestinal: Negative.   Musculoskeletal: Negative.   Skin: Negative.   Neurological: Negative.   Psychiatric/Behavioral: Negative.     Blood pressure (!) 155/116, pulse (!) 101, temperature 98.6 F (37 C), temperature source Oral, resp. rate 16, height 6\' 3"  (1.905 m), weight 80.3 kg, SpO2 95 %.Body mass index is 22.12 kg/m.  General Appearance: Casual  Eye Contact::  Fair  Speech:  Clear and [id number]  Volume:  Normal  Mood:  Euthymic  Affect:  Congruent  Thought Process:  Goal Directed  Orientation:  Full (Time, Place, and Person)  Thought Content:  Logical  Suicidal Thoughts:  No  Homicidal Thoughts:  No  Memory:  Immediate;   Fair Recent;   Fair Remote;   Fair  Judgement:  Fair  Insight:  Fair  Psychomotor Activity:  Decreased  Concentration:  Fair  Recall:  [employer] of Knowledge:Fair  Language: Fair  Akathisia:  No  Handed:  Right  AIMS (if indicated):     Assets:  Desire for Improvement Housing Resilience Social Support  Sleep:     Cognition: WNL  ADL's:  Impaired   Mental Status Per Nursing Assessment::   On Admission:  Suicidal ideation indicated by others  Demographic Factors:  Male, Age 78 or older and Caucasian  Loss Factors: Decline in physical health  Historical Factors: NA  Risk Reduction Factors:   Responsible for children under 56 years of age, Sense of  responsibility to family, Religious beliefs about death, Living with another person, especially a relative, Positive social support and Positive therapeutic relationship  Continued Clinical Symptoms:  Dysthymia Alcohol/Substance Abuse/Dependencies  Cognitive Features That Contribute To Risk:  None    Suicide Risk:  Minimal: No identifiable suicidal ideation.  Patients presenting with no risk factors but with morbid ruminations; may be classified as minimal risk based on the severity of the depressive symptoms    Plan Of Care/Follow-up recommendations:  Activity:  Activity as tolerated Diet:  Regular diet Other:  Follow-up outpatient care through the [place], MD 12/01/2019, 10:33 AM

## 2019-12-01 NOTE — Progress Notes (Signed)
CSW spoke with [facility] to arrange transportation for pt. [facility] reported they would reach out to their vendors to see if any was available to take pt due to him having COVID and give CSW a callback. CSW received call back stating they could pick up pt and needed pts information. They asked if pt used a walker or cane. CSW contacted pts nurse, Jinny Blossom who reported pt stated he uses a wheelchair at home and he cannot ambulate. Per pt he needs to be transported by EMS as he cannot walk and cannot stand to get into a car. CSW cancelled [facility] due to pt being unable to get into a car.   Evalina Field, MSW, LCSW Clinical Social Work 12/01/2019 11:59 AM

## 2019-12-01 NOTE — H&P (Signed)
Psychiatric Admission Assessment Adult  Patient Identification: Ryan Travis MRN:  [id number] Date of Evaluation:  12/01/2019 Chief Complaint:  MDD (major depressive disorder) [F32.9] Principal Diagnosis: Substance induced mood disorder ([place]) Diagnosis:  Principal Problem:   Substance induced mood disorder ([place]) Active Problems:   MDD (major depressive disorder)   Dysthymia   Alcohol abuse   COVID-19  History of Present Illness: Patient seen chart reviewed.  79 year old man who presented to the emergency room 2 days ago oxidated.  On first presentation it was documented that he had come to the hospital with symptoms of depression and had made some suicidal statements.  Subsequent evaluations later the patient was denying suicidal ideation and is denied any suicidal thoughts through the rest of his time at the hospital.  On interview today the patient says that he remembers coming to the hospital "for COVID".  Patient denies having any suicidal thoughts at all.  Says he has a young grandson who he values and has other commitments to his family and says he would never try to kill himself.  Patient admits that he has been drinking regularly which has been a problem for years.  His wife was recently in the hospital for [place] herself and during that time he admits that he was drinking probably even more than usual.  He denies however that he was using any narcotics and in fact says that he has not had any hydrocodone's in a few days.  Admits to occasional marijuana use.  Patient says his mood is a little bit down chronically because of his multiple medical problems but denies feeling constantly negative.  Says that at home usually he sleeps and eats adequately.  Denies any psychotic symptoms.  Denies any current active symptoms of withdrawal. Associated Signs/Symptoms: Depression Symptoms:  depressed mood, (Hypo) Manic Symptoms:  Impulsivity, Anxiety Symptoms:  None reported Psychotic Symptoms:   None reported PTSD Symptoms: Negative Total Time spent with patient: 1 hour  Past Psychiatric History: Patient has no previous psychiatric hospitalizations.  He has been diagnosed with alcohol abuse and has been treated for depression in the past with oral Zoloft.  Denies any history of suicide attempts or violence denies any history of psychosis.  Denies any history of delirium tremens.  Does not involve himself in active substance abuse treatment.  Is the patient at risk to self? No.  Has the patient been a risk to self in the past 6 months? No.  Has the patient been a risk to self within the distant past? No.  Is the patient a risk to others? No.  Has the patient been a risk to others in the past 6 months? No.  Has the patient been a risk to others within the distant past? No.   Prior Inpatient Therapy:   Prior Outpatient Therapy:    Alcohol Screening: 1. How often do you have a drink containing alcohol?: 2 to 3 times a week 2. How many drinks containing alcohol do you have on a typical day when you are drinking?: 5 or 6 3. How often do you have six or more drinks on one occasion?: Less than monthly AUDIT-C Score: 6 4. How often during the last year have you found that you were not able to stop drinking once you had started?: Less than monthly 5. How often during the last year have you failed to do what was normally expected from you becasue of drinking?: Less than monthly 6. How often during the last year have  you needed a first drink in the morning to get yourself going after a heavy drinking session?: Less than monthly 7. How often during the last year have you had a feeling of guilt of remorse after drinking?: Less than monthly 8. How often during the last year have you been unable to remember what happened the night before because you had been drinking?: Less than monthly 9. Have you or someone else been injured as a result of your drinking?: No 10. Has a relative or friend or a  doctor or another health worker been concerned about your drinking or suggested you cut down?: Yes, during the last year Alcohol Use Disorder Identification Test Final Score (AUDIT): 15 Alcohol Brief Interventions/Follow-up: Brief Advice Substance Abuse History in the last 12 months:  Yes.   Consequences of Substance Abuse: Medical Consequences:  Patient not having withdrawal but as I pointed out to him his heavy alcohol use is clearly going to be detrimental to his overall health both his mental health and physical state. Previous Psychotropic Medications: Yes  Psychological Evaluations: Yes  Past Medical History:  Past Medical History:  Diagnosis Date  . Alcohol abuse   . Anxiety   . Arthritis   . 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 Cancer ([place])    multiple myeloma  . Depression   . Dysrhythmia   . Fall    3 weeks ago/ torn ligaments left knee/wearing brace/pt  . Hypertension   . Neuropathy   . Pain    chronic leg and ankle pain  . RLS (restless legs syndrome)     Past Surgical History:  Procedure Laterality Date  . BRAIN SURGERY     subdural hematoma  . CARDIAC CATHETERIZATION N/A 10/16/2016   Procedure: Left Heart Cath and Coronary Angiography;  Surgeon: Nelva Bush, MD;  Location: [facility];  Service: Cardiovascular;  Laterality: N/A;  . CARDIAC CATHETERIZATION N/A 10/16/2016   Procedure: Coronary Stent Intervention;  Surgeon: Nelva Bush, MD;  Location: [facility];  Service: Cardiovascular;  Laterality: N/A;  . CARDIAC CATHETERIZATION N/A 10/16/2016   Procedure: Temporary Pacemaker;  Surgeon: Nelva Bush, MD;  Location: [facility];  Service: Cardiovascular;  Laterality: N/A;  . CATARACT EXTRACTION W/PHACO Right 08/16/2016   Procedure: CATARACT EXTRACTION PHACO AND INTRAOCULAR LENS PLACEMENT (IOC);  Surgeon: Eulogio Bear, MD;  Location: [facility];  Service: Ophthalmology;  Laterality: Right;  Lot # [id number] H [place]:  01:00.8 AP%:16.5 CDE: 10.02   . CATARACT EXTRACTION W/PHACO Left 10/04/2016   Procedure: CATARACT EXTRACTION PHACO AND INTRAOCULAR LENS PLACEMENT (IOC);  Surgeon: Eulogio Bear, MD;  Location: [facility];  Service: Ophthalmology;  Laterality: Left;  [place] 35.4 AP% 10.5 CDE 4.11 Fluid pack lot # [id number]  . EYE SURGERY    . FRACTURE SURGERY     multiple right ankle  . HERNIA REPAIR    . JOINT REPLACEMENT Bilateral    thr  . KNEE ARTHROSCOPY W/ ACL RECONSTRUCTION     left x 2  . rcr     Family History:  Family History  Problem Relation Age of Onset  . Heart attack Mother   . Heart attack Father    Family Psychiatric  History: Denies knowing of any Tobacco Screening: Have you used any form of tobacco in the last 30 days? (Cigarettes, Smokeless Tobacco, Cigars, and/or Pipes): Yes Tobacco use, Select all that apply: 4 or less cigarettes per day Are you interested in Tobacco Cessation Medications?: Yes, will  notify MD for an order Counseled patient on smoking cessation including recognizing danger situations, developing coping skills and basic information about quitting provided: Yes Social History:  Social History   Substance and Sexual Activity  Alcohol Use Yes   Comment: 2-3 beers/day or wine     Social History   Substance and Sexual Activity  Drug Use No    Additional Social History:                           Allergies:   Allergies  Allergen Reactions  . Azithromycin Other (See Comments)    Other reaction(s): Unknown   . Morphine And Related   . Neomycin   . Other     METHIOLATE  . Penicillins     Did it involve swelling of the face/tongue/throat, SOB, or low BP? Yes Did it involve sudden or severe rash/hives, skin peeling, or any reaction on the inside of your mouth or nose? Yes Did you need to seek medical attention at a hospital or doctor's office? Yes When did it last happen? 10 years If all above answers are "NO", may proceed with cephalosporin  use.   Lab Results:  Results for orders placed or performed during the hospital encounter of 11/29/19 (from the past 48 hour(s))  CBC     Status: Abnormal   Collection Time: 11/29/19  6:36 PM  Result Value Ref Range   WBC 8.5 4.0 - 10.5 K/uL   RBC 4.28 4.22 - 5.81 MIL/uL   Hemoglobin 14.4 13.0 - 17.0 g/dL   HCT 41.7 39.0 - 52.0 %   MCV 97.4 80.0 - 100.0 fL   MCH 33.6 26.0 - 34.0 pg   MCHC 34.5 30.0 - 36.0 g/dL   RDW 14.8 11.5 - 15.5 %   Platelets 136 (L) 150 - 400 K/uL    Comment: Immature Platelet Fraction may be clinically indicated, consider ordering this additional test [id number]    nRBC 0.0 0.0 - 0.2 %    Comment: Performed at [facility], [address] [place], [place] [address]  Comprehensive metabolic panel     Status: Abnormal   Collection Time: 11/29/19  6:36 PM  Result Value Ref Range   Sodium 144 135 - 145 mmol/L   Potassium 3.1 (L) 3.5 - 5.1 mmol/L   Chloride 105 98 - 111 mmol/L   CO2 24 22 - 32 mmol/L   Glucose, Bld 81 70 - 99 mg/dL   BUN 15 8 - 23 mg/dL   Creatinine, Ser 0.85 0.61 - 1.24 mg/dL   Calcium 8.5 (L) 8.9 - 10.3 mg/dL   Total Protein 6.8 6.5 - 8.1 g/dL   Albumin 3.4 (L) 3.5 - 5.0 g/dL   AST 62 (H) 15 - 41 U/L   ALT 28 0 - 44 U/L   Alkaline Phosphatase 100 38 - 126 U/L   Total Bilirubin 1.2 0.3 - 1.2 mg/dL   GFR calc non Af Amer >60 >60 mL/min   GFR calc Af Amer >60 >60 mL/min   Anion gap 15 5 - 15    Comment: Performed at [facility], [address] [place], [place] [address]  Acetaminophen level     Status: Abnormal   Collection Time: 11/29/19  6:36 PM  Result Value Ref Range   Acetaminophen (Tylenol), Serum <10 (L) 10 - 30 ug/mL    Comment: (NOTE) Therapeutic concentrations vary significantly. A range of 10-30 ug/mL  may be  an effective concentration for many patients. However, some  are best treated at concentrations outside of this range. Acetaminophen concentrations >150 ug/mL at 4 hours after ingestion  and  >50 ug/mL at 12 hours after ingestion are often associated with  toxic reactions. Performed at [facility], [address] [place], [place] [address]   Ethanol     Status: Abnormal   Collection Time: 11/29/19  6:36 PM  Result Value Ref Range   Alcohol, Ethyl (B) 271 (H) <10 mg/dL    Comment: (NOTE) Lowest detectable limit for serum alcohol is 10 mg/dL. For medical purposes only. Performed at [facility], [address] [place], [place] [address]   Salicylate level     Status: Abnormal   Collection Time: 11/29/19  6:36 PM  Result Value Ref Range   Salicylate Lvl <9.2 (L) 7.0 - 30.0 mg/dL    Comment: Performed at [facility], [address] [place], [place] [address]  Urine Drug Screen, Qualitative     Status: Abnormal   Collection Time: 11/29/19  6:36 PM  Result Value Ref Range   Tricyclic, Ur Screen NONE DETECTED NONE DETECTED   Amphetamines, Ur Screen NONE DETECTED NONE DETECTED   MDMA (Ecstasy)Ur Screen NONE DETECTED NONE DETECTED   Cocaine Metabolite,Ur [place] NONE DETECTED NONE DETECTED   Opiate, Ur Screen NONE DETECTED NONE DETECTED   Phencyclidine (PCP) Ur S NONE DETECTED NONE DETECTED   Cannabinoid 50 Ng, Ur [place] POSITIVE (A) NONE DETECTED   Barbiturates, Ur Screen NONE DETECTED NONE DETECTED   Benzodiazepine, Ur Scrn NONE DETECTED NONE DETECTED   Methadone Scn, Ur NONE DETECTED NONE DETECTED    Comment: (NOTE) Tricyclics + metabolites, urine    Cutoff 1000 ng/mL Amphetamines + metabolites, urine  Cutoff 1000 ng/mL MDMA (Ecstasy), urine              Cutoff 500 ng/mL Cocaine Metabolite, urine          Cutoff 300 ng/mL Opiate + metabolites, urine        Cutoff 300 ng/mL Phencyclidine (PCP), urine         Cutoff 25 ng/mL Cannabinoid, urine                 Cutoff 50 ng/mL Barbiturates + metabolites, urine  Cutoff 200 ng/mL Benzodiazepine, urine              Cutoff 200 ng/mL Methadone, urine                   Cutoff 300 ng/mL The urine drug  screen provides only a preliminary, unconfirmed analytical test result and should not be used for non-medical purposes. Clinical consideration and professional judgment should be applied to any positive drug screen result due to possible interfering substances. A more specific alternate chemical method must be used in order to obtain a confirmed analytical result. Gas chromatography / mass spectrometry (GC/MS) is the preferred confirmat ory method. Performed at [facility], [address] [place], [place] [address]   Urinalysis, Complete w Microscopic     Status: Abnormal   Collection Time: 11/29/19  6:36 PM  Result Value Ref Range   Color, Urine YELLOW (A) YELLOW   APPearance HAZY (A) CLEAR   Specific Gravity, Urine 1.013 1.005 - 1.030   pH 6.0 5.0 - 8.0   Glucose, UA NEGATIVE NEGATIVE mg/dL   Hgb urine dipstick SMALL (A) NEGATIVE   Bilirubin Urine NEGATIVE NEGATIVE   Ketones, ur 5 (A)  NEGATIVE mg/dL   Protein, ur 30 (A) NEGATIVE mg/dL   Nitrite NEGATIVE NEGATIVE   Leukocytes,Ua NEGATIVE NEGATIVE   RBC / HPF 11-20 0 - 5 RBC/hpf   WBC, UA 0-5 0 - 5 WBC/hpf   Bacteria, UA NONE SEEN NONE SEEN   Squamous Epithelial / LPF 0-5 0 - 5   Mucus PRESENT     Comment: Performed at [facility], [address] [place], [place] [address]  Respiratory Panel by RT PCR (Flu A&B, Covid) - Nasopharyngeal Swab     Status: Abnormal   Collection Time: 11/29/19  6:36 PM   Specimen: Nasopharyngeal Swab  Result Value Ref Range   SARS Coronavirus 2 by RT PCR POSITIVE (A) NEGATIVE    Comment: RESULT CALLED TO, READ BACK BY AND VERIFIED WITH: ANNA CALES ON 11/29/2019 AT [place] (NOTE) SARS-CoV-2 target nucleic acids are DETECTED. SARS-CoV-2 RNA is generally detectable in upper respiratory specimens  during the acute phase of infection. Positive results are indicative of the presence of the identified virus, but do not rule out bacterial infection or co-infection with other  pathogens not detected by the test. Clinical correlation with patient history and other diagnostic information is necessary to determine patient infection status. The expected result is Negative. Fact Sheet for Patients:  PinkCheek.be Fact Sheet for Healthcare Providers: GravelBags.it This test is not yet approved or cleared by the [place] FDA and  has been authorized for detection and/or diagnosis of SARS-CoV-2 by FDA under an Emergency Use Authorization (EUA).  This EUA will remain in effect (meaning this test can be used)  for the duration of  the COVID-19 declaration under Section 564(b)(1) of the Act, 21 U.S.C. section 360bbb-3(b)(1), unless the authorization is terminated or revoked sooner.    Influenza A by PCR NEGATIVE NEGATIVE   Influenza B by PCR NEGATIVE NEGATIVE    Comment: (NOTE) The Xpert Xpress SARS-CoV-2/FLU/RSV assay is intended as an aid in  the diagnosis of influenza from Nasopharyngeal swab specimens and  should not be used as a sole basis for treatment. Nasal washings and  aspirates are unacceptable for Xpert Xpress SARS-CoV-2/FLU/RSV  testing. Fact Sheet for Patients: PinkCheek.be Fact Sheet for Healthcare Providers: GravelBags.it This test is not yet approved or cleared by the [place] FDA and  has been authorized for detection and/or diagnosis of SARS-CoV-2 by  FDA under an Emergency Use Authorization (EUA). This EUA will remain  in effect (meaning this test can be used) for the duration of the  Covid-19 declaration under Section 564(b)(1) of the Act, 21  U.S.C. section 360bbb-3(b)(1), unless the authorization is  terminated or revoked. Performed at [facility], [address] [place], [place] [address]     Blood Alcohol level:  Lab Results  Component Value Date   ETH 271 (H) 11/29/2019   ETH 192 (H) 19/50/9326     Metabolic Disorder Labs:  Lab Results  Component Value Date   HGBA1C 4.9 01/08/2019   MPG 93.93 01/08/2019   MPG 105 10/17/2016   No results found for: PROLACTIN Lab Results  Component Value Date   CHOL 179 01/08/2019   TRIG 68 01/08/2019   HDL 119 01/08/2019   CHOLHDL 1.5 01/08/2019   VLDL 14 01/08/2019   LDLCALC 46 01/08/2019   LDLCALC 65 10/17/2016    Current Medications: Current Facility-Administered Medications  Medication Dose Route Frequency Provider Last Rate Last Admin  . [START ON 12/02/2019] pneumococcal 23 valent vaccine (PNEUMOVAX-23) injection 0.5 mL  0.5 mL Intramuscular Tomorrow-1000 Adalaide Jaskolski, Madie Reno, MD       PTA Medications: Medications Prior to Admission  Medication Sig Dispense Refill Last Dose  . carvedilol (COREG) 6.25 MG tablet Take 1 tablet (6.25 mg total) by mouth 2 (two) times daily with a meal. 60 tablet 0   . folic acid (FOLVITE) 1 MG tablet Take 1 mg by mouth daily.     Marland Kitchen gabapentin (NEURONTIN) 100 MG capsule Take 2 capsules (200 mg total) by mouth at bedtime. 60 capsule 0   . losartan (COZAAR) 25 MG tablet Take 25 mg by mouth daily.     Marland Kitchen omeprazole (PRILOSEC) 20 MG capsule Take 20 mg by mouth daily.     . predniSONE (STERAPRED UNI-PAK 21 TAB) 10 MG (21) TBPK tablet Dispense steroid taper pack as directed 21 tablet 0   . sertraline (ZOLOFT) 100 MG tablet Take 1 tablet (100 mg total) by mouth at bedtime. 30 tablet 0   . simvastatin (ZOCOR) 40 MG tablet Take 40 mg by mouth at bedtime.       Musculoskeletal: Strength & Muscle Tone: decreased Gait & Station: unsteady Patient leans: N/A  Psychiatric Specialty Exam: Physical Exam  Nursing note and vitals reviewed. Constitutional: He appears well-developed and well-nourished.  HENT:  Head: Normocephalic and atraumatic.  Eyes: Pupils are equal, round, and reactive to light. Conjunctivae are normal.  Cardiovascular: Normal heart sounds.  Respiratory: Effort normal.  GI: Soft.   Musculoskeletal:        General: Normal range of motion.     Cervical back: Normal range of motion.  Neurological: He is alert.  Skin: Skin is warm and dry.  Psychiatric: He has a normal mood and affect. His speech is normal and behavior is normal. Judgment and thought content normal. He exhibits abnormal recent memory.    Review of Systems  Constitutional: Positive for fatigue.  HENT: Negative.   Eyes: Negative.   Respiratory: Negative.   Cardiovascular: Negative.   Gastrointestinal: Negative.   Musculoskeletal: Negative.   Skin: Negative.   Neurological: Negative.   Psychiatric/Behavioral: Negative.     Blood pressure (!) 155/116, pulse (!) 101, temperature 98.6 F (37 C), temperature source Oral, resp. rate 16, height 6' 3" (1.905 m), weight 80.3 kg, SpO2 95 %.Body mass index is 22.12 kg/m.  General Appearance: Casual  Eye Contact:  Good  Speech:  Clear and Coherent  Volume:  Normal  Mood:  Euthymic  Affect:  Congruent  Thought Process:  Goal Directed  Orientation:  Full (Time, Place, and Person)  Thought Content:  Logical  Suicidal Thoughts:  No  Homicidal Thoughts:  No  Memory:  Immediate;   Fair Recent;   Fair Remote;   Fair  Judgement:  Fair  Insight:  Fair  Psychomotor Activity:  Decreased  Concentration:  Concentration: Fair  Recall:  [employer] of Knowledge:  Fair  Language:  Fair  Akathisia:  No  Handed:  Right  AIMS (if indicated):     Assets:  Desire for Improvement Housing Social Support  ADL's:  Intact  Cognition:  Impaired,  Mild  Sleep:       Treatment Plan Summary: Daily contact with patient to assess and evaluate symptoms and progress in treatment, Medication management and Plan 79 year old man with alcohol abuse and situational depression currently denying suicidal thoughts.  No evidence of suicidal behavior or intent.  Not having active alcohol withdrawal symptoms.  Shows good insight and is lucid in his thinking.  Patient apparently has  no indication for inpatient psychiatric treatment.  Treatment team will be contacted and we will work on discharge planning.  Observation Level/Precautions:  15 minute checks  Laboratory:  UDS  Psychotherapy:    Medications:    Consultations:    Discharge Concerns:    Estimated LOS:  Other:     Physician Treatment Plan for Primary Diagnosis: Substance induced mood disorder ([place]) Long Term Goal(s): Improvement in symptoms so as ready for discharge  Short Term Goals: Ability to verbalize feelings will improve  Physician Treatment Plan for Secondary Diagnosis: Principal Problem:   Substance induced mood disorder ([facility]) Active Problems:   MDD (major depressive disorder)   Dysthymia   Alcohol abuse   COVID-19  Long Term Goal(s): Improvement in symptoms so as ready for discharge  Short Term Goals: Compliance with prescribed medications will improve  I certify that inpatient services furnished can reasonably be expected to improve the patient's condition.    Alethia Berthold, MD 1/26/202110:36 AM

## 2019-12-01 NOTE — Progress Notes (Signed)
Pt just woke up to use the urinal. He refuses to eat his breakfast. Pt is safe. Will continue to monitor. Collier Bullock RN

## 2019-12-01 NOTE — Progress Notes (Signed)
Called nonemergent call to EMS. I was informed that patient is now second in line for transport.  Patient continues to express anxiety over discharge.  Signage was made up for his door at home stating an elderly couple is COVID (+), and please do not enter.  Patient is lying in bed quietly watching TV.  He asked for gingerale.  He apologtized for his impatience.

## 2019-12-01 NOTE — Plan of Care (Signed)
Pt ready to dc to home by way of EMS. Pt denies SI, HI and AVS. Pt was educated on dc plan and instructions including ones related to Covid. Pt verbalizes understanding. Pt received dc packet and belongings. Collier Bullock RN Problem: Education: Goal: Utilization of techniques to improve thought processes will improve 12/01/2019 1602 by Kieth Brightly, RN Outcome: Adequate for Discharge 12/01/2019 1039 by Kieth Brightly, RN Outcome: Progressing Goal: Knowledge of the prescribed therapeutic regimen will improve 12/01/2019 1602 by Kieth Brightly, RN Outcome: Adequate for Discharge 12/01/2019 1039 by Kieth Brightly, RN Outcome: Progressing   Problem: Activity: Goal: Interest or engagement in leisure activities will improve 12/01/2019 1602 by Kieth Brightly, RN Outcome: Adequate for Discharge 12/01/2019 1039 by Kieth Brightly, RN Outcome: Progressing Goal: Imbalance in normal sleep/wake cycle will improve 12/01/2019 1602 by Kieth Brightly, RN Outcome: Adequate for Discharge 12/01/2019 1039 by Kieth Brightly, RN Outcome: Progressing   Problem: Coping: Goal: Coping ability will improve 12/01/2019 1602 by Kieth Brightly, RN Outcome: Adequate for Discharge 12/01/2019 1039 by Kieth Brightly, RN Outcome: Progressing Goal: Will verbalize feelings 12/01/2019 1602 by Kieth Brightly, RN Outcome: Adequate for Discharge 12/01/2019 1039 by Kieth Brightly, RN Outcome: Progressing   Problem: Health Behavior/Discharge Planning: Goal: Ability to make decisions will improve 12/01/2019 1602 by Kieth Brightly, RN Outcome: Adequate for Discharge 12/01/2019 1039 by Kieth Brightly, RN Outcome: Progressing Goal: Compliance with therapeutic regimen will improve 12/01/2019 1602 by Kieth Brightly, RN Outcome: Adequate for Discharge 12/01/2019 1039 by Kieth Brightly, RN Outcome: Progressing   Problem: Role Relationship: Goal: Will demonstrate positive changes in social behaviors and  relationships 12/01/2019 1602 by Kieth Brightly, RN Outcome: Adequate for Discharge 12/01/2019 1039 by Kieth Brightly, RN Outcome: Progressing   Problem: Safety: Goal: Ability to disclose and discuss suicidal ideas will improve 12/01/2019 1602 by Kieth Brightly, RN Outcome: Adequate for Discharge 12/01/2019 1039 by Kieth Brightly, RN Outcome: Progressing Goal: Ability to identify and utilize support systems that promote safety will improve 12/01/2019 1602 by Kieth Brightly, RN Outcome: Adequate for Discharge 12/01/2019 1039 by Kieth Brightly, RN Outcome: Progressing   Problem: Self-Concept: Goal: Will verbalize positive feelings about self 12/01/2019 1602 by Kieth Brightly, RN Outcome: Adequate for Discharge 12/01/2019 1039 by Kieth Brightly, RN Outcome: Progressing Goal: Level of anxiety will decrease 12/01/2019 1602 by Kieth Brightly, RN Outcome: Adequate for Discharge 12/01/2019 1039 by Kieth Brightly, RN Outcome: Progressing   Problem: Education: Goal: Knowledge of [place] Education information/materials will improve 12/01/2019 1602 by Kieth Brightly, RN Outcome: Adequate for Discharge 12/01/2019 1039 by Kieth Brightly, RN Outcome: Progressing Goal: Emotional status will improve 12/01/2019 1602 by Kieth Brightly, RN Outcome: Adequate for Discharge 12/01/2019 1039 by Kieth Brightly, RN Outcome: Progressing Goal: Mental status will improve 12/01/2019 1602 by Kieth Brightly, RN Outcome: Adequate for Discharge 12/01/2019 1039 by Kieth Brightly, RN Outcome: Progressing Goal: Verbalization of understanding the information provided will improve 12/01/2019 1602 by Kieth Brightly, RN Outcome: Adequate for Discharge 12/01/2019 1039 by Kieth Brightly, RN Outcome: Progressing   Problem: Activity: Goal: Interest or engagement in activities will improve 12/01/2019 1602 by Kieth Brightly, RN Outcome: Adequate for Discharge 12/01/2019 1039 by Kieth Brightly,  RN Outcome: Progressing Goal: Sleeping patterns will improve 12/01/2019 1602 by Kieth Brightly, RN Outcome: Adequate for Discharge 12/01/2019 1039  by Kieth Brightly, RN Outcome: Progressing   Problem: Coping: Goal: Ability to verbalize frustrations and anger appropriately will improve 12/01/2019 1602 by Kieth Brightly, RN Outcome: Adequate for Discharge 12/01/2019 1039 by Kieth Brightly, RN Outcome: Progressing Goal: Ability to demonstrate self-control will improve 12/01/2019 1602 by Kieth Brightly, RN Outcome: Adequate for Discharge 12/01/2019 1039 by Kieth Brightly, RN Outcome: Progressing   Problem: Health Behavior/Discharge Planning: Goal: Identification of resources available to assist in meeting health care needs will improve 12/01/2019 1602 by Kieth Brightly, RN Outcome: Adequate for Discharge 12/01/2019 1039 by Kieth Brightly, RN Outcome: Progressing Goal: Compliance with treatment plan for underlying cause of condition will improve 12/01/2019 1602 by Kieth Brightly, RN Outcome: Adequate for Discharge 12/01/2019 1039 by Kieth Brightly, RN Outcome: Progressing   Problem: Physical Regulation: Goal: Ability to maintain clinical measurements within normal limits will improve 12/01/2019 1602 by Kieth Brightly, RN Outcome: Adequate for Discharge 12/01/2019 1039 by Kieth Brightly, RN Outcome: Progressing   Problem: Safety: Goal: Periods of time without injury will increase 12/01/2019 1602 by Kieth Brightly, RN Outcome: Adequate for Discharge 12/01/2019 1039 by Kieth Brightly, RN Outcome: Progressing

## 2019-12-01 NOTE — [facility] Suicide Risk Assessment (Signed)
[facility] Admission Suicide Risk Assessment   Nursing information obtained from:  Patient Demographic factors:  Male, Caucasian, Age 79 or older, Unemployed Current Mental Status:  Suicidal ideation indicated by others Loss Factors:  NA Historical Factors:  Impulsivity Risk Reduction Factors:  Religious beliefs about death  Total Time spent with patient: 1 hour Principal Problem: Substance induced mood disorder ([place]) Diagnosis:  Principal Problem:   Substance induced mood disorder ([place]) Active Problems:   MDD (major depressive disorder)   Dysthymia   Alcohol abuse   COVID-19  Subjective Data: Patient seen chart reviewed.  79 year old man with a known history of alcohol abuse presented to the emergency room 2 days ago intoxicated.  At the time it was documented that he had made suicidal statements but had not made an attempt to try and harm himself.  For the last day at least and certainly on interview today the patient absolutely denies any suicidal ideation at all minimizes depressive symptoms does not present with severe withdrawal symptoms or DTs.  Continued Clinical Symptoms:  Alcohol Use Disorder Identification Test Final Score (AUDIT): 15 The "Alcohol Use Disorders Identification Test", Guidelines for Use in Primary Care, Second Edition.  World Pharmacologist [facility]). Score between 0-7:  no or low risk or alcohol related problems. Score between 8-15:  moderate risk of alcohol related problems. Score between 16-19:  high risk of alcohol related problems. Score 20 or above:  warrants further diagnostic evaluation for alcohol dependence and treatment.   CLINICAL FACTORS:   Alcohol/Substance Abuse/Dependencies Medical Diagnoses and Treatments/Surgeries   Musculoskeletal: Strength & Muscle Tone: decreased Gait & Station: unsteady Patient leans: N/A  Psychiatric Specialty Exam: Physical Exam  Nursing note and vitals reviewed. Constitutional: He appears well-developed and  well-nourished.  HENT:  Head: Normocephalic and atraumatic.  Eyes: Pupils are equal, round, and reactive to light. Conjunctivae are normal.  Cardiovascular: Regular rhythm and normal heart sounds.  Respiratory: Effort normal. No respiratory distress.  GI: Soft.  Musculoskeletal:        General: Normal range of motion.     Cervical back: Normal range of motion.  Neurological: He is alert.  Skin: Skin is warm and dry.  Psychiatric: He has a normal mood and affect. His speech is normal and behavior is normal. Judgment and thought content normal. Cognition and memory are normal.    Review of Systems  Constitutional: Negative.   HENT: Negative.   Eyes: Negative.   Respiratory: Negative.   Cardiovascular: Negative.   Gastrointestinal: Negative.   Musculoskeletal: Negative.   Skin: Negative.   Neurological: Negative.   Psychiatric/Behavioral: Negative.     Blood pressure (!) 159/83, pulse 82, temperature 98.4 F (36.9 C), temperature source Oral, resp. rate (!) 21, height 6\' 3"  (1.905 m), weight 80.3 kg, SpO2 98 %.Body mass index is 22.12 kg/m.  General Appearance: Casual  Eye Contact:  Fair  Speech:  Clear and Coherent  Volume:  Normal  Mood:  Euthymic  Affect:  Constricted  Thought Process:  Goal Directed  Orientation:  Full (Time, Place, and Person)  Thought Content:  Logical  Suicidal Thoughts:  No  Homicidal Thoughts:  No  Memory:  Immediate;   Fair Recent;   Fair Remote;   Fair  Judgement:  Fair  Insight:  Fair  Psychomotor Activity:  Decreased  Concentration:  Concentration: Fair  Recall:  [employer] of Knowledge:  Fair  Language:  Fair  Akathisia:  No  Handed:  Right  AIMS (if indicated):  Assets:  Chief Executive Officer Social Support  ADL's:  Impaired  Cognition:  WNL  Sleep:         COGNITIVE FEATURES THAT CONTRIBUTE TO RISK:  None    SUICIDE RISK:   Minimal: No identifiable suicidal ideation.  Patients presenting with no risk factors  but with morbid ruminations; may be classified as minimal risk based on the severity of the depressive symptoms  PLAN OF CARE: Patient seen chart reviewed.  Patient is to be discharged today with recommended follow-up through the [place] system.  Counseling completed.  Psychoeducation completed.  Currently not at acute risk of self injury and not requiring further psychiatric hospitalization.  I certify that inpatient services furnished can reasonably be expected to improve the patient's condition.   Alethia Berthold, MD 12/01/2019, 10:27 AM

## 2019-12-01 NOTE — Progress Notes (Addendum)
Float pool NT has been relieved for lunch by the MHT. Pt is more awake now, talking more and drinking a gingerale. Will continue to monitor and keep safe. Collier Bullock RN

## 2019-12-01 NOTE — Progress Notes (Signed)
Pt was given two meds. Lung sounds are clear. Pt did have a non productive cough once. Collier Bullock RN

## 2019-12-01 NOTE — Progress Notes (Signed)
D: Took over care at 0730 after donning PPE. Patient states he is "tired of waiting for EMS."  Informed patient that he is third in line the last time the nurse on the prior shift checked.  Patient is currently lying in bed watching TV.  Currently still awaiting EMS. Patient spoke with Warner Mccreedy, AD on the [facility] phone.  He requested a couple of signs to hang on his door that states, "elderly person lives here with COVID. Do not enter." I told him I would see what we could come up with. Patient is unable to ambulate, hence the EMS transportation. Patient denies any SI/HI/AVH. He is looking forward to discharging.  A: Continue to monitor medication management and MD orders.  Safety checks completed every 15 minutes per protocol.  Offer support and encouragement as needed.  R: Patient is becoming impatient awaiting EMS. He is redirectable and his behavior is appropriate.     [place] NOVEL CORONAVIRUS (COVID-19) DAILY CHECK-OFF SYMPTOMS - answer yes or no to each - every day NO YES  Have you had a fever in the past 24 hours?  . Fever (Temp > 37.80C / 100F) X   Have you had any of these symptoms in the past 24 hours? . New Cough .  Sore Throat  .  Shortness of Breath .  Difficulty Breathing .  Unexplained Body Aches   X   Have you had any one of these symptoms in the past 24 hours not related to allergies?   . Runny Nose .  Nasal Congestion .  Sneezing   X   If you have had runny nose, nasal congestion, sneezing in the past 24 hours, has it worsened?  X   EXPOSURES - check yes or no X   Have you traveled outside the state in the past 14 days?  X   Have you been in contact with someone with a confirmed diagnosis of COVID-19 or PUI in the past 14 days without wearing appropriate PPE?   X  Have you been living in the same home as a person with confirmed diagnosis of COVID-19 or a PUI (household contact)?     X  Have you been diagnosed with COVID-19?     X             What to do next:  Answered NO to all: Answered YES to anything:   Proceed with unit schedule Follow the [facility] Inpatient Flowsheet.

## 2019-12-01 NOTE — Discharge Summary (Signed)
Physician Discharge Summary Note  Patient:  Ryan Travis is an 79 y.o., male MRN:  [id number] DOB:  June 30, 1941 Patient phone:  [phone] (home)  Patient address:   [address] [place] [address],  Total Time spent with patient: 1 hour  Date of Admission:  11/30/2019 Date of Discharge: December 01, 2019  Reason for Admission: Patient admitted to the psychiatric unit after presenting to the emergency room with alcohol intoxication and reports of suicidal ideation.  Principal Problem: Substance induced mood disorder [facility] Discharge Diagnoses: Principal Problem:   Substance induced mood disorder ([place]) Active Problems:   MDD (major depressive disorder)   Dysthymia   Alcohol abuse   COVID-19   Past Psychiatric History: Patient has a known history of alcohol abuse.  No past history of suicide attempts or violence.  No history of psychosis.  Has been treated with Zoloft by his outpatient providers at the [place].  Past Medical History:  Past Medical History:  Diagnosis Date  . Alcohol abuse   . Anxiety   . Arthritis   . 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 Cancer ([place])    multiple myeloma  . Depression   . Dysrhythmia   . Fall    3 weeks ago/ torn ligaments left knee/wearing brace/pt  . Hypertension   . Neuropathy   . Pain    chronic leg and ankle pain  . RLS (restless legs syndrome)     Past Surgical History:  Procedure Laterality Date  . BRAIN SURGERY     subdural hematoma  . CARDIAC CATHETERIZATION N/A 10/16/2016   Procedure: Left Heart Cath and Coronary Angiography;  Surgeon: Nelva Bush, MD;  Location: [facility];  Service: Cardiovascular;  Laterality: N/A;  . CARDIAC CATHETERIZATION N/A 10/16/2016   Procedure: Coronary Stent Intervention;  Surgeon: Nelva Bush, MD;  Location: [facility];  Service: Cardiovascular;  Laterality: N/A;  . CARDIAC CATHETERIZATION N/A 10/16/2016   Procedure: Temporary Pacemaker;  Surgeon: Nelva Bush, MD;   Location: [facility];  Service: Cardiovascular;  Laterality: N/A;  . CATARACT EXTRACTION W/PHACO Right 08/16/2016   Procedure: CATARACT EXTRACTION PHACO AND INTRAOCULAR LENS PLACEMENT (IOC);  Surgeon: Eulogio Bear, MD;  Location: [facility];  Service: Ophthalmology;  Laterality: Right;  Lot # [id number] H [place]: 01:00.8 AP%:16.5 CDE: 10.02   . CATARACT EXTRACTION W/PHACO Left 10/04/2016   Procedure: CATARACT EXTRACTION PHACO AND INTRAOCULAR LENS PLACEMENT (IOC);  Surgeon: Eulogio Bear, MD;  Location: [facility];  Service: Ophthalmology;  Laterality: Left;  [place] 35.4 AP% 10.5 CDE 4.11 Fluid pack lot # [id number]  . EYE SURGERY    . FRACTURE SURGERY     multiple right ankle  . HERNIA REPAIR    . JOINT REPLACEMENT Bilateral    thr  . KNEE ARTHROSCOPY W/ ACL RECONSTRUCTION     left x 2  . rcr     Family History:  Family History  Problem Relation Age of Onset  . Heart attack Mother   . Heart attack Father    Family Psychiatric  History: None reported Social History:  Social History   Substance and Sexual Activity  Alcohol Use Yes   Comment: 2-3 beers/day or wine     Social History   Substance and Sexual Activity  Drug Use No    Social History   Socioeconomic History  . Marital status: Married    Spouse name: Not on file  . Number of children: Not on file  .  Years of education: Not on file  . Highest education level: Not on file  Occupational History  . Not on file  Tobacco Use  . Smoking status: Current Every Day Smoker    Packs/day: 0.30    Years: 20.00    Pack years: 6.00    Types: Cigarettes  . Smokeless tobacco: Never Used  Substance and Sexual Activity  . Alcohol use: Yes    Comment: 2-3 beers/day or wine  . Drug use: No  . Sexual activity: Never  Other Topics Concern  . Not on file  Social History Narrative  . Not on file   Social Determinants of Health   Financial Resource Strain:   . Difficulty of Paying Living Expenses: Not on file   Food Insecurity:   . Worried About Charity fundraiser in the Last Year: Not on file  . Ran Out of Food in the Last Year: Not on file  Transportation Needs:   . Lack of Transportation (Medical): Not on file  . Lack of Transportation (Non-Medical): Not on file  Physical Activity:   . Days of Exercise per Week: Not on file  . Minutes of Exercise per Session: Not on file  Stress:   . Feeling of Stress : Not on file  Social Connections:   . Frequency of Communication with Friends and Family: Not on file  . Frequency of Social Gatherings with Friends and Family: Not on file  . Attends Religious Services: Not on file  . Active Member of Clubs or Organizations: Not on file  . Attends Archivist Meetings: Not on file  . Marital Status: Not on file    Hospital Course: Patient admitted to the COVID/psychiatry unit at [facility].  Patient was on a one-to-one as per the protocol of that unit.  Patient showed no behavior problems throughout his time in the hospital.  Was not aggressive or violent.  Has consistently denied any suicidal thought.  On interview with me the patient was pleasant and forthcoming.  Admits to having a longstanding alcohol problem.  Admits that he knows that if he were to stop drinking he would feel better and that it would help with his health.  He consistently however denies having any suicidal thoughts whatsoever and is able to offer positive comments about his life and reasons not to harm himself.  He is open to the possibility of outpatient substance abuse treatment and is already taking antidepressant medicine.  I spoke with his wife on the telephone who says there are no firearms at home and that she feels the patient is safe to come home.  Patient is going to be discharged today with no new medication but continuing his usual outpatient meds and a referral to the [place] system and a strong recommendation that he avail himself of their substance abuse  treatment.  Physical Findings: AIMS: Facial and Oral Movements Muscles of Facial Expression: None, normal Lips and Perioral Area: None, normal Jaw: None, normal Tongue: None, normal,Extremity Movements Upper (arms, wrists, hands, fingers): None, normal Lower (legs, knees, ankles, toes): None, normal, Trunk Movements Neck, shoulders, hips: None, normal, Overall Severity Severity of abnormal movements (highest score from questions above): None, normal Incapacitation due to abnormal movements: None, normal Patient's awareness of abnormal movements (rate only patient's report): No Awareness, Dental Status Current problems with teeth and/or dentures?: No Does patient usually wear dentures?: No  CIWA:    COWS:     Musculoskeletal: Strength & Muscle Tone:  decreased Gait & Station: unsteady Patient leans: N/A  Psychiatric Specialty Exam: Physical Exam  Nursing note and vitals reviewed. Constitutional: He appears well-developed and well-nourished.  HENT:  Head: Normocephalic and atraumatic.  Eyes: Pupils are equal, round, and reactive to light. Conjunctivae are normal.  Cardiovascular: Regular rhythm and normal heart sounds.  Respiratory: Effort normal. No respiratory distress.  GI: Soft.  Musculoskeletal:        General: Normal range of motion.     Cervical back: Normal range of motion.  Neurological: He is alert.  Skin: Skin is warm and dry.  Psychiatric: He has a normal mood and affect. His speech is normal and behavior is normal. Judgment and thought content normal. Cognition and memory are normal.    Review of Systems  Constitutional: Negative.   HENT: Negative.   Eyes: Negative.   Respiratory: Negative.   Cardiovascular: Negative.   Gastrointestinal: Negative.   Musculoskeletal: Negative.   Skin: Negative.   Neurological: Negative.   Psychiatric/Behavioral: Negative.     Blood pressure (!) 155/116, pulse (!) 101, temperature 98.6 F (37 C), temperature source Oral,  resp. rate 16, height '6\' 3"'  (1.905 m), weight 80.3 kg, SpO2 95 %.Body mass index is 22.12 kg/m.  General Appearance: Casual  Eye Contact:  Good  Speech:  Clear and Coherent  Volume:  Normal  Mood:  Euthymic  Affect:  Congruent  Thought Process:  Goal Directed  Orientation:  Full (Time, Place, and Person)  Thought Content:  Logical  Suicidal Thoughts:  No  Homicidal Thoughts:  No  Memory:  Immediate;   Fair Recent;   Fair Remote;   Fair  Judgement:  Fair  Insight:  Fair  Psychomotor Activity:  Normal  Concentration:  Concentration: Fair  Recall:  [place] of Knowledge:  Fair  Language:  Fair  Akathisia:  No  Handed:  Right  AIMS (if indicated):     Assets:  Desire for Improvement Financial Resources/Insurance Housing Resilience  ADL's:  Intact  Cognition:  Impaired,  Mild  Sleep:        Have you used any form of tobacco in the last 30 days? (Cigarettes, Smokeless Tobacco, Cigars, and/or Pipes): Yes  Has this patient used any form of tobacco in the last 30 days? (Cigarettes, Smokeless Tobacco, Cigars, and/or Pipes) Yes, Yes, A prescription for an FDA-approved tobacco cessation medication was offered at discharge and the patient refused  Blood Alcohol level:  Lab Results  Component Value Date   ETH 271 (H) 11/29/2019   ETH 192 (H) 10/40/4591    Metabolic Disorder Labs:  Lab Results  Component Value Date   HGBA1C 4.9 01/08/2019   MPG 93.93 01/08/2019   MPG 105 10/17/2016   No results found for: PROLACTIN Lab Results  Component Value Date   CHOL 179 01/08/2019   TRIG 68 01/08/2019   HDL 119 01/08/2019   CHOLHDL 1.5 01/08/2019   VLDL 14 01/08/2019   LDLCALC 46 01/08/2019   LDLCALC 65 10/17/2016    See Psychiatric Specialty Exam and Suicide Risk Assessment completed by Attending Physician prior to discharge.  Discharge destination:  Home  Is patient on multiple antipsychotic therapies at discharge:  No   Has Patient had three or more failed trials of  antipsychotic monotherapy by history:  No  Recommended Plan for Multiple Antipsychotic Therapies: NA  Discharge Instructions    Diet - low sodium heart healthy   Complete by: As directed    Increase activity slowly  Complete by: As directed      Allergies as of 12/01/2019      Reactions   Azithromycin Other (See Comments)   Other reaction(s): Unknown   Morphine And Related    Neomycin    Other    METHIOLATE   Penicillins    Did it involve swelling of the face/tongue/throat, SOB, or low BP? Yes Did it involve sudden or severe rash/hives, skin peeling, or any reaction on the inside of your mouth or nose? Yes Did you need to seek medical attention at a hospital or doctor's office? Yes When did it last happen? 10 years If all above answers are "NO", may proceed with cephalosporin use.      Medication List    STOP taking these medications   predniSONE 10 MG (21) Tbpk tablet Commonly known as: STERAPRED UNI-PAK 21 TAB     TAKE these medications     Indication  carvedilol 6.25 MG tablet Commonly known as: COREG Take 1 tablet (6.25 mg total) by mouth 2 (two) times daily with a meal.    folic acid 1 MG tablet Commonly known as: FOLVITE Take 1 mg by mouth daily.    gabapentin 100 MG capsule Commonly known as: NEURONTIN Take 2 capsules (200 mg total) by mouth at bedtime.    losartan 25 MG tablet Commonly known as: COZAAR Take 25 mg by mouth daily.    omeprazole 20 MG capsule Commonly known as: PRILOSEC Take 20 mg by mouth daily.    sertraline 100 MG tablet Commonly known as: ZOLOFT Take 1 tablet (100 mg total) by mouth at bedtime.    simvastatin 40 MG tablet Commonly known as: ZOCOR Take 40 mg by mouth at bedtime.         Follow-up recommendations:  Activity:  Patient is under the weather and is chronically somewhat impaired.  Activity only as tolerated usually. Diet:  Diet as usual. Other:  Patient has follow-up treatment through the [place] system.  Advised to  get substance abuse treatment there as well.  Comments: I spoke with the patient's wife on the telephone to confirm that there were no firearms at home and that patient could be discharged home.  She asked me to find out the phone number to talk with him.  After discovering the phone number I tried to call her back and have made 2 attempts with no one answering the telephone.  Case discussed with treatment team  Signed: Alethia Berthold, MD 12/01/2019, 10:53 AM

## 2019-12-01 NOTE — H&P (Signed)
Psychiatric Admission Assessment Adult  Patient Identification: Ryan Travis MRN:  [id number] Date of Evaluation:  12/01/2019  Chief Complaint:  MDD (major depressive disorder) [F32.9] Principal Diagnosis: <principal problem not specified> Diagnosis:  Active Problems:   MDD (major depressive disorder)  History of Present Illness:   Ryan Travis is a 79 y.o. male with a history of depression and alcohol abuse was seen via tele by this provider. Pt states he came to the hospital for [place]. He denies feeling depressed. He denies SI/HI, AVH or self harm. He states he drinks 1 pint of vodka per day. He states he does not see a therapist or psychiatrist. States he does not take any psychiatric medication. Pt states he will like to know when he is getting discharged.  During evaluation pt is lying on bed; he is alert/oriented to person and place; calm/cooperative; and mood is euthymic congruent with affect. Pt speech is slurred at moderate volume, and normal pace; with fair eye contact. His thought process is coherent and relevant; There is no indication that he is currently responding to internal/external stimuli or experiencing delusional thought content. Pt's judgement and insight is fair; impulse control is intact.   Per TTS [place]: Ryan Travis is an 79 y.o. male. Mr. Grieshaber arrived to the ED by way of EMS.  He states that he does not know why he is here today.  He reports that he has been feeling depress.  He shared that he has been feeling depressed since his wife has been ill.  He states that he has not had any alcohol in 2 days.  He states that he has had no appetite.  He reports that he is having problems sleeping.  He denied symptoms of anxiety.  He denied having auditory or visual hallucinations.  He denied suicidal or homicidal ideation or intent.  Patient reports daily alcohol use, stating that he has not had a drink in 2 days.  UDS positive for Cannabinoid. BAC = 271. Mr.  Lindon reports that he has been stressed about his wife's recent hospitalization. His wife was discharged home today.   TTS spoke with Leonia Corona (Step-Daughter) [phone] - States she found a empty bottle of pills for hydrocodone.  The prescription was filled on 11/28/2019.  She states, "I believe that he was trying to kill himself".  Starting last week, Wife went into the hospital with COVID. She reports that he has been having rapid shifting moods from depression to anger to depression.  She states that he has been drinking constantly since she went to the hospital.  He offered his stepdaughter $100.00 to go to the liquor store for him. She states that she found 8 empty bottles (pints) of vodka and a half gallon.  He has been hollering that he wants die.  He has been crying and pissing on himself and has been just sitting in it a lot.  This is just not like him. I have never seen him like this. He may be tired of being in a wheel chair and I am afraid. He has been depressed.    Associated Signs/Symptoms: Depression Symptoms:  Pt denies (Hypo) Manic Symptoms:  NA Anxiety Symptoms:  NA Psychotic Symptoms:  NA PTSD Symptoms: NA Total Time spent with patient: 30 minutes  Past Psychiatric History: MDD, alcohol abuse  Is the patient at risk to self? No.  Has the patient been a risk to self in the past 6 months? No.  Has the  patient been a risk to self within the distant past? No.  Is the patient a risk to others? No.  Has the patient been a risk to others in the past 6 months? No.  Has the patient been a risk to others within the distant past? No.   Prior Inpatient Therapy:   Prior Outpatient Therapy:    Alcohol Screening:   Substance Abuse History in the last 12 months:  No. Consequences of Substance Abuse: NA Previous Psychotropic Medications: Yes  Psychological Evaluations: Yes  Past Medical History:  Past Medical History:  Diagnosis Date  . Alcohol abuse   . Anxiety   .  Arthritis   . 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 Cancer ([place])    multiple myeloma  . Depression   . Dysrhythmia   . Fall    3 weeks ago/ torn ligaments left knee/wearing brace/pt  . Hypertension   . Neuropathy   . Pain    chronic leg and ankle pain  . RLS (restless legs syndrome)     Past Surgical History:  Procedure Laterality Date  . BRAIN SURGERY     subdural hematoma  . CARDIAC CATHETERIZATION N/A 10/16/2016   Procedure: Left Heart Cath and Coronary Angiography;  Surgeon: Nelva Bush, MD;  Location: [facility];  Service: Cardiovascular;  Laterality: N/A;  . CARDIAC CATHETERIZATION N/A 10/16/2016   Procedure: Coronary Stent Intervention;  Surgeon: Nelva Bush, MD;  Location: [facility];  Service: Cardiovascular;  Laterality: N/A;  . CARDIAC CATHETERIZATION N/A 10/16/2016   Procedure: Temporary Pacemaker;  Surgeon: Nelva Bush, MD;  Location: [facility];  Service: Cardiovascular;  Laterality: N/A;  . CATARACT EXTRACTION W/PHACO Right 08/16/2016   Procedure: CATARACT EXTRACTION PHACO AND INTRAOCULAR LENS PLACEMENT (IOC);  Surgeon: Eulogio Bear, MD;  Location: [facility];  Service: Ophthalmology;  Laterality: Right;  Lot # [id number] H [place]: 01:00.8 AP%:16.5 CDE: 10.02   . CATARACT EXTRACTION W/PHACO Left 10/04/2016   Procedure: CATARACT EXTRACTION PHACO AND INTRAOCULAR LENS PLACEMENT (IOC);  Surgeon: Eulogio Bear, MD;  Location: [facility];  Service: Ophthalmology;  Laterality: Left;  [place] 35.4 AP% 10.5 CDE 4.11 Fluid pack lot # [id number]  . EYE SURGERY    . FRACTURE SURGERY     multiple right ankle  . HERNIA REPAIR    . JOINT REPLACEMENT Bilateral    thr  . KNEE ARTHROSCOPY W/ ACL RECONSTRUCTION     left x 2  . rcr     Family History:  Family History  Problem Relation Age of Onset  . Heart attack Mother   . Heart attack Father    Family Psychiatric  History: Unkown Tobacco Screening:   Social History:  Social History    Substance and Sexual Activity  Alcohol Use Yes   Comment: 2-3 beers/day or wine     Social History   Substance and Sexual Activity  Drug Use No    Additional Social History:                           Allergies:   Allergies  Allergen Reactions  . Azithromycin Other (See Comments)    Other reaction(s): Unknown   . Morphine And Related   . Neomycin   . Other     METHIOLATE  . Penicillins     Did it involve swelling of the face/tongue/throat, SOB, or low BP? Yes Did it involve sudden or severe rash/hives, skin  peeling, or any reaction on the inside of your mouth or nose? Yes Did you need to seek medical attention at a hospital or doctor's office? Yes When did it last happen? 10 years If all above answers are "NO", may proceed with cephalosporin use.   Lab Results:  Results for orders placed or performed during the hospital encounter of 11/29/19 (from the past 48 hour(s))  CBC     Status: Abnormal   Collection Time: 11/29/19  6:36 PM  Result Value Ref Range   WBC 8.5 4.0 - 10.5 K/uL   RBC 4.28 4.22 - 5.81 MIL/uL   Hemoglobin 14.4 13.0 - 17.0 g/dL   HCT 41.7 39.0 - 52.0 %   MCV 97.4 80.0 - 100.0 fL   MCH 33.6 26.0 - 34.0 pg   MCHC 34.5 30.0 - 36.0 g/dL   RDW 14.8 11.5 - 15.5 %   Platelets 136 (L) 150 - 400 K/uL    Comment: Immature Platelet Fraction may be clinically indicated, consider ordering this additional test [id number]    nRBC 0.0 0.0 - 0.2 %    Comment: Performed at [facility], [address] [place], [place] [address]  Comprehensive metabolic panel     Status: Abnormal   Collection Time: 11/29/19  6:36 PM  Result Value Ref Range   Sodium 144 135 - 145 mmol/L   Potassium 3.1 (L) 3.5 - 5.1 mmol/L   Chloride 105 98 - 111 mmol/L   CO2 24 22 - 32 mmol/L   Glucose, Bld 81 70 - 99 mg/dL   BUN 15 8 - 23 mg/dL   Creatinine, Ser 0.85 0.61 - 1.24 mg/dL   Calcium 8.5 (L) 8.9 - 10.3 mg/dL   Total Protein 6.8 6.5 - 8.1 g/dL   Albumin 3.4  (L) 3.5 - 5.0 g/dL   AST 62 (H) 15 - 41 U/L   ALT 28 0 - 44 U/L   Alkaline Phosphatase 100 38 - 126 U/L   Total Bilirubin 1.2 0.3 - 1.2 mg/dL   GFR calc non Af Amer >60 >60 mL/min   GFR calc Af Amer >60 >60 mL/min   Anion gap 15 5 - 15    Comment: Performed at [facility], [address] [place], [place] [address]  Acetaminophen level     Status: Abnormal   Collection Time: 11/29/19  6:36 PM  Result Value Ref Range   Acetaminophen (Tylenol), Serum <10 (L) 10 - 30 ug/mL    Comment: (NOTE) Therapeutic concentrations vary significantly. A range of 10-30 ug/mL  may be an effective concentration for many patients. However, some  are best treated at concentrations outside of this range. Acetaminophen concentrations >150 ug/mL at 4 hours after ingestion  and >50 ug/mL at 12 hours after ingestion are often associated with  toxic reactions. Performed at [facility], [address] [place], [place] [address]   Ethanol     Status: Abnormal   Collection Time: 11/29/19  6:36 PM  Result Value Ref Range   Alcohol, Ethyl (B) 271 (H) <10 mg/dL    Comment: (NOTE) Lowest detectable limit for serum alcohol is 10 mg/dL. For medical purposes only. Performed at [facility], [address] [place],  [address]   Salicylate level     Status: Abnormal   Collection Time: 11/29/19  6:36 PM  Result Value Ref Range   Salicylate Lvl <0.3 (L) 7.0 - 30.0 mg/dL    Comment: Performed at [facility], [address] [place], [place] [address]  Urine Drug Screen, Qualitative     Status: Abnormal   Collection Time: 11/29/19  6:36 PM  Result Value Ref Range   Tricyclic, Ur Screen NONE DETECTED NONE DETECTED   Amphetamines, Ur Screen NONE DETECTED NONE DETECTED   MDMA (Ecstasy)Ur Screen NONE DETECTED NONE DETECTED   Cocaine Metabolite,Ur [place] NONE DETECTED NONE DETECTED   Opiate, Ur Screen NONE DETECTED NONE DETECTED   Phencyclidine (PCP) Ur S NONE DETECTED  NONE DETECTED   Cannabinoid 50 Ng, Ur [place] POSITIVE (A) NONE DETECTED   Barbiturates, Ur Screen NONE DETECTED NONE DETECTED   Benzodiazepine, Ur Scrn NONE DETECTED NONE DETECTED   Methadone Scn, Ur NONE DETECTED NONE DETECTED    Comment: (NOTE) Tricyclics + metabolites, urine    Cutoff 1000 ng/mL Amphetamines + metabolites, urine  Cutoff 1000 ng/mL MDMA (Ecstasy), urine              Cutoff 500 ng/mL Cocaine Metabolite, urine          Cutoff 300 ng/mL Opiate + metabolites, urine        Cutoff 300 ng/mL Phencyclidine (PCP), urine         Cutoff 25 ng/mL Cannabinoid, urine                 Cutoff 50 ng/mL Barbiturates + metabolites, urine  Cutoff 200 ng/mL Benzodiazepine, urine              Cutoff 200 ng/mL Methadone, urine                   Cutoff 300 ng/mL The urine drug screen provides only a preliminary, unconfirmed analytical test result and should not be used for non-medical purposes. Clinical consideration and professional judgment should be applied to any positive drug screen result due to possible interfering substances. A more specific alternate chemical method must be used in order to obtain a confirmed analytical result. Gas chromatography / mass spectrometry (GC/MS) is the preferred confirmat ory method. Performed at [facility], [address] [place], [place] [address]   Urinalysis, Complete w Microscopic     Status: Abnormal   Collection Time: 11/29/19  6:36 PM  Result Value Ref Range   Color, Urine YELLOW (A) YELLOW   APPearance HAZY (A) CLEAR   Specific Gravity, Urine 1.013 1.005 - 1.030   pH 6.0 5.0 - 8.0   Glucose, UA NEGATIVE NEGATIVE mg/dL   Hgb urine dipstick SMALL (A) NEGATIVE   Bilirubin Urine NEGATIVE NEGATIVE   Ketones, ur 5 (A) NEGATIVE mg/dL   Protein, ur 30 (A) NEGATIVE mg/dL   Nitrite NEGATIVE NEGATIVE   Leukocytes,Ua NEGATIVE NEGATIVE   RBC / HPF 11-20 0 - 5 RBC/hpf   WBC, UA 0-5 0 - 5 WBC/hpf   Bacteria, UA NONE SEEN NONE SEEN    Squamous Epithelial / LPF 0-5 0 - 5   Mucus PRESENT     Comment: Performed at [facility], [address], [place], [place] [address]  Respiratory Panel by RT PCR (Flu A&B, Covid) - Nasopharyngeal Swab     Status: Abnormal   Collection Time: 11/29/19  6:36 PM   Specimen: Nasopharyngeal Swab  Result Value Ref Range   SARS Coronavirus 2 by RT PCR POSITIVE (A) NEGATIVE    Comment: RESULT CALLED TO, READ BACK BY AND VERIFIED WITH: ANNA CALES ON 11/29/2019 AT [place] (NOTE) SARS-CoV-2 target nucleic acids are DETECTED. SARS-CoV-2 RNA is generally detectable in upper  respiratory specimens  during the acute phase of infection. Positive results are indicative of the presence of the identified virus, but do not rule out bacterial infection or co-infection with other pathogens not detected by the test. Clinical correlation with patient history and other diagnostic information is necessary to determine patient infection status. The expected result is Negative. Fact Sheet for Patients:  PinkCheek.be Fact Sheet for Healthcare Providers: GravelBags.it This test is not yet approved or cleared by the [place] FDA and  has been authorized for detection and/or diagnosis of SARS-CoV-2 by FDA under an Emergency Use Authorization (EUA).  This EUA will remain in effect (meaning this test can be used)  for the duration of  the COVID-19 declaration under Section 564(b)(1) of the Act, 21 U.S.C. section 360bbb-3(b)(1), unless the authorization is terminated or revoked sooner.    Influenza A by PCR NEGATIVE NEGATIVE   Influenza B by PCR NEGATIVE NEGATIVE    Comment: (NOTE) The Xpert Xpress SARS-CoV-2/FLU/RSV assay is intended as an aid in  the diagnosis of influenza from Nasopharyngeal swab specimens and  should not be used as a sole basis for treatment. Nasal washings and  aspirates are unacceptable for Xpert Xpress  SARS-CoV-2/FLU/RSV  testing. Fact Sheet for Patients: PinkCheek.be Fact Sheet for Healthcare Providers: GravelBags.it This test is not yet approved or cleared by the [place] FDA and  has been authorized for detection and/or diagnosis of SARS-CoV-2 by  FDA under an Emergency Use Authorization (EUA). This EUA will remain  in effect (meaning this test can be used) for the duration of the  Covid-19 declaration under Section 564(b)(1) of the Act, 21  U.S.C. section 360bbb-3(b)(1), unless the authorization is  terminated or revoked. Performed at [facility], [address] [place], [place] [address]     Blood Alcohol level:  Lab Results  Component Value Date   ETH 271 (H) 11/29/2019   ETH 192 (H) 23/30/0762    Metabolic Disorder Labs:  Lab Results  Component Value Date   HGBA1C 4.9 01/08/2019   MPG 93.93 01/08/2019   MPG 105 10/17/2016   No results found for: PROLACTIN Lab Results  Component Value Date   CHOL 179 01/08/2019   TRIG 68 01/08/2019   HDL 119 01/08/2019   CHOLHDL 1.5 01/08/2019   VLDL 14 01/08/2019   LDLCALC 46 01/08/2019   LDLCALC 65 10/17/2016    Current Medications: No current facility-administered medications for this encounter.   PTA Medications: Medications Prior to Admission  Medication Sig Dispense Refill Last Dose  . carvedilol (COREG) 6.25 MG tablet Take 1 tablet (6.25 mg total) by mouth 2 (two) times daily with a meal. 60 tablet 0   . folic acid (FOLVITE) 1 MG tablet Take 1 mg by mouth daily.     Marland Kitchen gabapentin (NEURONTIN) 100 MG capsule Take 2 capsules (200 mg total) by mouth at bedtime. 60 capsule 0   . losartan (COZAAR) 25 MG tablet Take 25 mg by mouth daily.     Marland Kitchen omeprazole (PRILOSEC) 20 MG capsule Take 20 mg by mouth daily.     . predniSONE (STERAPRED UNI-PAK 21 TAB) 10 MG (21) TBPK tablet Dispense steroid taper pack as directed 21 tablet 0   . sertraline (ZOLOFT)  100 MG tablet Take 1 tablet (100 mg total) by mouth at bedtime. 30 tablet 0   . simvastatin (ZOCOR) 40 MG tablet Take 40 mg by mouth at bedtime.       Musculoskeletal: Strength & Muscle Tone:  within normal limits Gait & Station: normal Patient leans: N/A  Psychiatric Specialty Exam: Physical Exam  Constitutional: He appears well-developed.  HENT:  Head: Normocephalic.  Eyes: Pupils are equal, round, and reactive to light.  Respiratory: Effort normal.  Musculoskeletal:        General: Normal range of motion.     Cervical back: Normal range of motion.  Neurological: He is alert.  Skin: Skin is warm and dry.  Psychiatric: He has a normal mood and affect. His speech is normal and behavior is normal. Judgment and thought content normal. Cognition and memory are normal.    Review of Systems  Psychiatric/Behavioral: Positive for confusion. Negative for agitation, behavioral problems, decreased concentration and hallucinations (denies). Suicidal ideas: denies. The patient is not nervous/anxious.   All other systems reviewed and are negative.   There were no vitals taken for this visit.There is no height or weight on file to calculate BMI.  General Appearance: Casual  Eye Contact:  Fair  Speech:  Slurred  Volume:  Normal  Mood:  Euthymic  Affect:  Congruent  Thought Process:  Coherent and Descriptions of Associations: Intact  Orientation:  Other:  Person and place  Thought Content:  WDL  Suicidal Thoughts:  No  Homicidal Thoughts:  No  Memory:  Recent;   Good  Judgement:  Good  Insight:  Lacking  Psychomotor Activity:  Normal  Concentration:  Concentration: Fair  Recall:  Good  Fund of Knowledge:  Good  Language:  Good  Akathisia:  No  Handed:  Right  AIMS (if indicated):     Assets:  Communication Skills Desire for Improvement Financial Resources/Insurance Housing Social Support  ADL's:  Intact  Cognition:  WNL  Sleep:       Treatment Plan Summary: Daily contact  with patient to assess and evaluate symptoms and progress in treatment and Medication management  Observation Level/Precautions:  15 minute checks  Laboratory:  Chemistry Profile UDS  Psychotherapy:    Medications:    Consultations:    Discharge Concerns:    Estimated LOS:  Other:      Disposition: Recommend psychiatric Inpatient admission when medically cleared. Supportive therapy provided about ongoing stressors.   Physician Treatment Plan for Primary Diagnosis: <principal problem not specified> Long Term Goal(s): Improvement in symptoms so as ready for discharge  Short Term Goals: Ability to identify changes in lifestyle to reduce recurrence of condition will improve, Ability to verbalize feelings will improve, Ability to disclose and discuss suicidal ideas, Ability to demonstrate self-control will improve, Ability to identify and develop effective coping behaviors will improve, Ability to maintain clinical measurements within normal limits will improve and Ability to identify triggers associated with substance abuse/mental health issues will improve  Physician Treatment Plan for Secondary Diagnosis: Active Problems:   MDD (major depressive disorder)  Long Term Goal(s): Improvement in symptoms so as ready for discharge  Short Term Goals: Ability to identify changes in lifestyle to reduce recurrence of condition will improve, Ability to verbalize feelings will improve, Ability to disclose and discuss suicidal ideas, Ability to demonstrate self-control will improve, Ability to identify and develop effective coping behaviors will improve, Ability to maintain clinical measurements within normal limits will improve and Ability to identify triggers associated with substance abuse/mental health issues will improve  I certify that inpatient services furnished can reasonably be expected to improve the patient's condition.    Mliss Fritz, NP 1/26/202112:22 AM

## 2019-12-01 NOTE — Progress Notes (Signed)
Pt is talking to his wife on the [place] phone. Collier Bullock RN

## 2019-12-01 NOTE — BHH Counselor (Signed)
CSW spoke with someone at the [place] in [place]. They report that they can not offer the patient transportation.   Assunta Curtis, MSW, LCSW 12/01/2019 3:12 PM

## 2019-12-01 NOTE — Progress Notes (Signed)
Discharge note:  Patient discharged via EMS because he cannot ambulate.  Patient denies any SI/HI/AVH.  Patient was pleasant and cooperative.  AVS given and all personal belongings were returned to him.  Patient was given zoloft before departure.  Patient discharged at 2115.

## 2019-12-01 NOTE — Progress Notes (Signed)
I made a non emergent call to EMS again for the pt. Again they said that he was third in line. I told the pt and he is very anxious to leave. I gave the pt the [place] phone and Warner Mccreedy is going to call him. Collier Bullock RN

## 2019-12-01 NOTE — Progress Notes (Signed)
I made a non emergent phone call to the EMS to see where the pt was currently on the list to leave. They said that he was "3" in the line up. Pt is very dissappointed. Collier Bullock RN

## 2019-12-01 NOTE — Progress Notes (Signed)
Pt is sitting on the side of the bed eating dinner. Pt had his scrubs changed because he missed when he was trying to use the urinal. Pt is calm, patient and cooperative as he waits for EMS transport. Collier Bullock RN

## 2019-12-01 NOTE — BHH Counselor (Signed)
CSW spoke with Jamesha [phone]) at [facility] transportation services who reports if the pt can not ambulate they will not be able to coordinate a transport. She contacted another vendor who states they cannot offer transportation due to liability reasons. Marta Antu contacted her supervisor and CSW was instructed to contact the county EMS. CSW relayed this information to Lissa Merlin, Therapist, sports and was encouraged to contact the ED secretary at [phone] to find out how to coordinate the EMS transport. Secretary reports call 911, have nurse complete medical necessity form, print facesheet and let EMS know pt is covid positive. CSW relayed this information to the unit director and was informed they would work on coordinating the transport.

## 2019-12-01 NOTE — BHH Group Notes (Signed)
CSW spoke with [facility] [place].   CSW asked if possible for the patient to have the [place] phones to be able to speak with patient and assist with care.  CSW was informed that the unit has that phone but it is being used to communicate with the nurse assisting with dressing down and on to the unit.    CSW asked if there was a fax machine so that paperwork could be faxed over for the patient to complete and was informed that there is not a fax machine on the unit.  PER [facility] it would be unlikely for the patient to hear CSW on the phone as it was loud in the room.  CSW notes that the iPads, designated to speak with the patients on the [facility] is not working.   Assunta Curtis, MSW, LCSW 12/01/2019 12:00 PM

## 2019-12-01 NOTE — Progress Notes (Signed)
D: Patient transferred from the ED. Alert and oriented times four. Pleasant and cooperative. Says he is here because he has COVID. Denies SI. Said that the empty bottle of hydrocodone found was filled on the 23rd of last month and states that his daughter must have taken them all. Denies drinking any alcohol since Saturday and minimized alcohol use. Denies symptoms of withdrawal. Denies AVH or HI. Skin search done and reddened area found on buttocks. Patient is bedbound and incontinent of urine. Wet himself upon arrival to the unit. Clothing and linen changed. No contraband found. Contracts for safety. 1:1 sitter within reach A: Continue to monitor for safety. R: Safety maintained.

## 2019-12-01 NOTE — Progress Notes (Signed)
Pt states that he cannot stand up to get in a car to transport to home. "I need EMS". He also is refusing his lunch tray. Collier Bullock RN

## 2019-12-01 NOTE — Plan of Care (Signed)
Pt denies depression, anxiety, SI, HI and AVS. Pt was educated on care plan and verbalizes understanding. Collier Bullock RN Problem: Education: Goal: Utilization of techniques to improve thought processes will improve Outcome: Progressing Goal: Knowledge of the prescribed therapeutic regimen will improve Outcome: Progressing   Problem: Activity: Goal: Interest or engagement in leisure activities will improve Outcome: Progressing Goal: Imbalance in normal sleep/wake cycle will improve Outcome: Progressing   Problem: Coping: Goal: Coping ability will improve Outcome: Progressing Goal: Will verbalize feelings Outcome: Progressing   Problem: Health Behavior/Discharge Planning: Goal: Ability to make decisions will improve Outcome: Progressing Goal: Compliance with therapeutic regimen will improve Outcome: Progressing   Problem: Role Relationship: Goal: Will demonstrate positive changes in social behaviors and relationships Outcome: Progressing   Problem: Safety: Goal: Ability to disclose and discuss suicidal ideas will improve Outcome: Progressing Goal: Ability to identify and utilize support systems that promote safety will improve Outcome: Progressing   Problem: Self-Concept: Goal: Will verbalize positive feelings about self Outcome: Progressing Goal: Level of anxiety will decrease Outcome: Progressing   Problem: Education: Goal: Knowledge of [facility] Education information/materials will improve Outcome: Progressing Goal: Emotional status will improve Outcome: Progressing Goal: Mental status will improve Outcome: Progressing Goal: Verbalization of understanding the information provided will improve Outcome: Progressing   Problem: Activity: Goal: Interest or engagement in activities will improve Outcome: Progressing Goal: Sleeping patterns will improve Outcome: Progressing   Problem: Coping: Goal: Ability to verbalize frustrations and anger appropriately  will improve Outcome: Progressing Goal: Ability to demonstrate self-control will improve Outcome: Progressing   Problem: Health Behavior/Discharge Planning: Goal: Identification of resources available to assist in meeting health care needs will improve Outcome: Progressing Goal: Compliance with treatment plan for underlying cause of condition will improve Outcome: Progressing   Problem: Physical Regulation: Goal: Ability to maintain clinical measurements within normal limits will improve Outcome: Progressing   Problem: Safety: Goal: Periods of time without injury will increase Outcome: Progressing

## 2019-12-01 NOTE — Tx Team (Signed)
Initial Treatment Plan 12/01/2019 1:36 AM [name] ZI:[id number]    PATIENT STRESSORS: Health problems   PATIENT STRENGTHS: Communication skills Supportive family/friends   PATIENT IDENTIFIED PROBLEMS:         depression  suicidal           DISCHARGE CRITERIA:  Ability to meet basic life and health needs Adequate post-discharge living arrangements Improved stabilization in mood, thinking, and/or behavior Medical problems require only outpatient monitoring Motivation to continue treatment in a less acute level of care Need for constant or close observation no longer present Reduction of life-threatening or endangering symptoms to within safe limits Safe-care adequate arrangements made Verbal commitment to aftercare and medication compliance  PRELIMINARY DISCHARGE PLAN: Outpatient therapy Return to previous living arrangement  PATIENT/FAMILY INVOLVEMENT: This treatment plan has been presented to and reviewed with the patient, Ryan Travis, and/or family member.  The patient and family have been given the opportunity to ask questions and make suggestions.  Libby Maw, RN 12/01/2019, 1:36 AM

## 2019-12-01 NOTE — Progress Notes (Signed)
1:1 observation 0200-0400 Patient resting in bed with eyes closed. Tech within arm's reach. Respirations even and unlabored. [id number] Patient resting with tech within arm's reach. Voices no complaints. Respirations even and unlabored.

## 2020-03-05 DEATH — deceased

## 2021-01-27 IMAGING — CT CT HEAD W/O CM
3 of 6 series · 16 of 47 positions shown, 19 images · non-contrast
Comparison: 06/17/2018

CLINICAL DATA: Altered mental status

EXAM:
CT HEAD WITHOUT CONTRAST
TECHNIQUE: Contiguous axial images were obtained from the base of the skull
through the vertex without intravenous contrast.

[Series 3: head wo · axial · 0.41mm/px · z∈[-89,+41]mm · 10 of 32 slices shown, 13 images]
[im 3/32  brain]
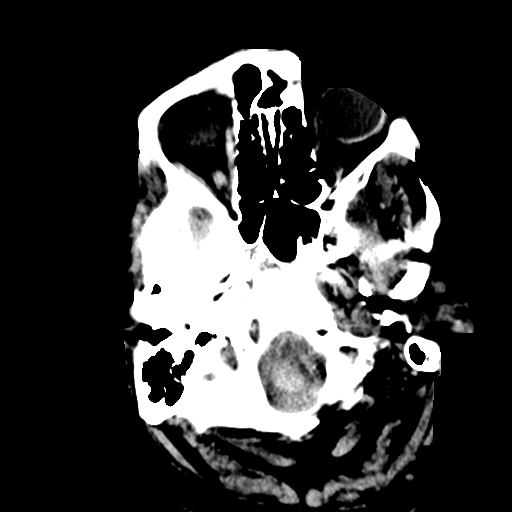
[im 3/32  bone]
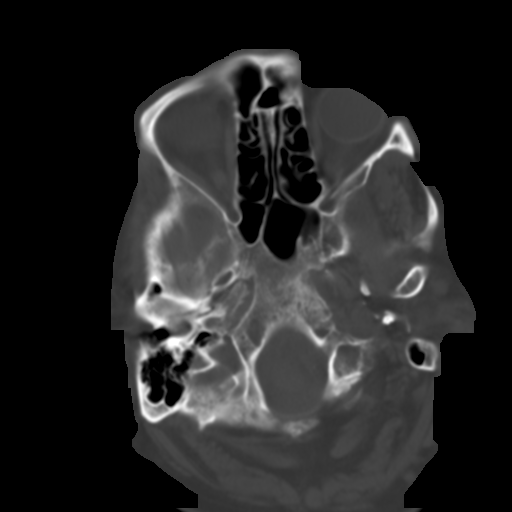
[im 7/32  brain]
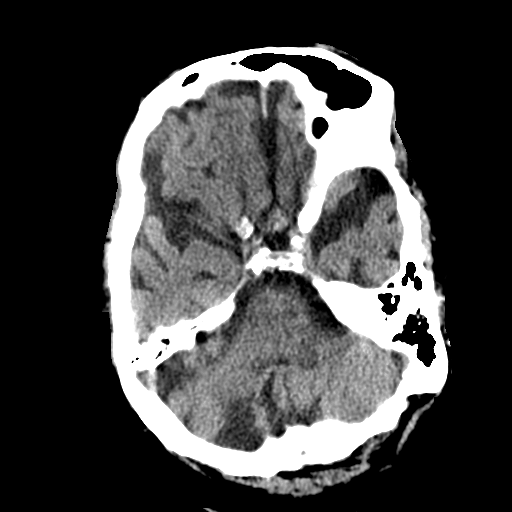
[im 9/32  brain]
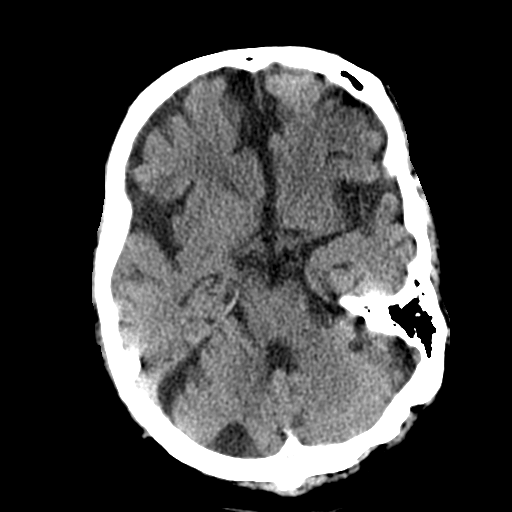
[im 11/32  brain]
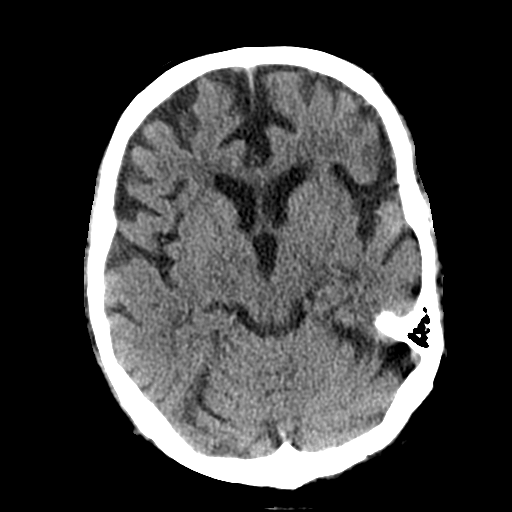
[im 15/32  brain]
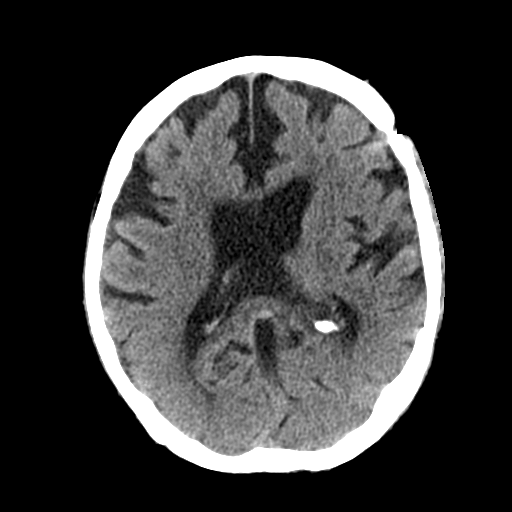
[im 15/32  bone]
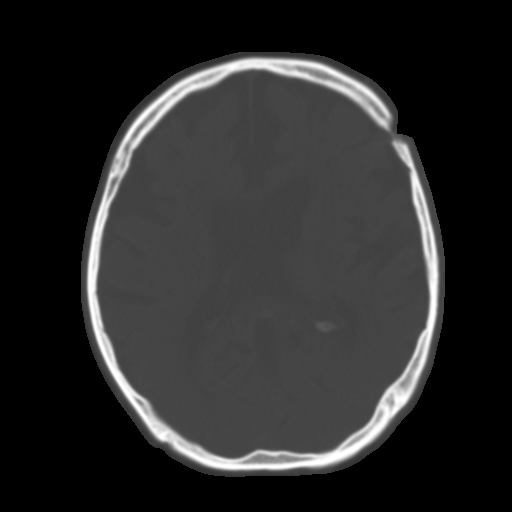
[im 17/32  brain]
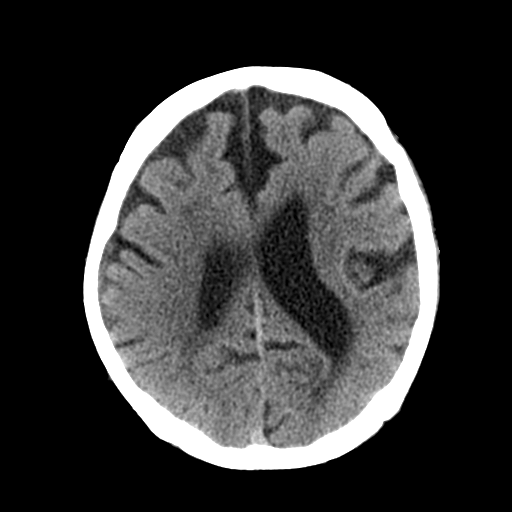
[im 21/32  brain]
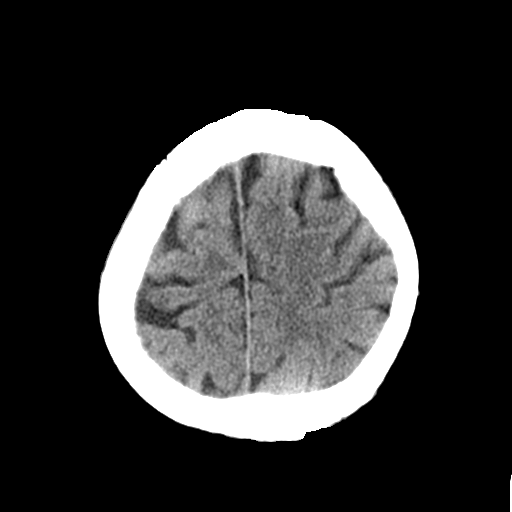
[im 23/32  brain]
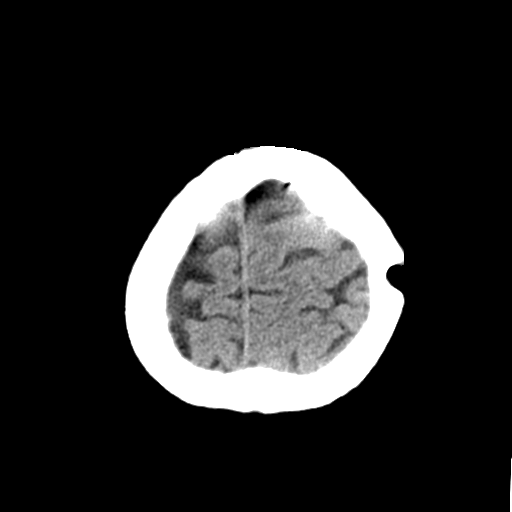
[im 25/32  brain]
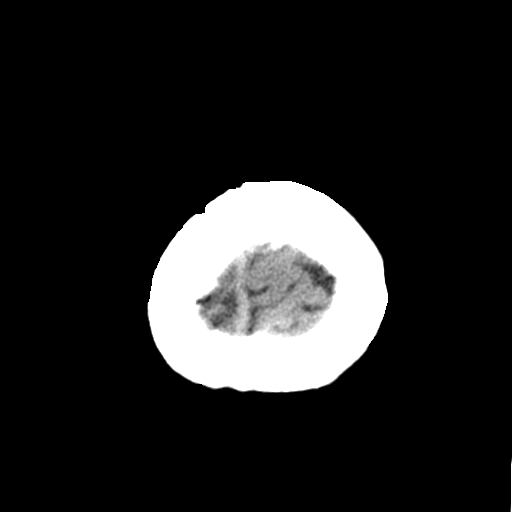
[im 25/32  bone]
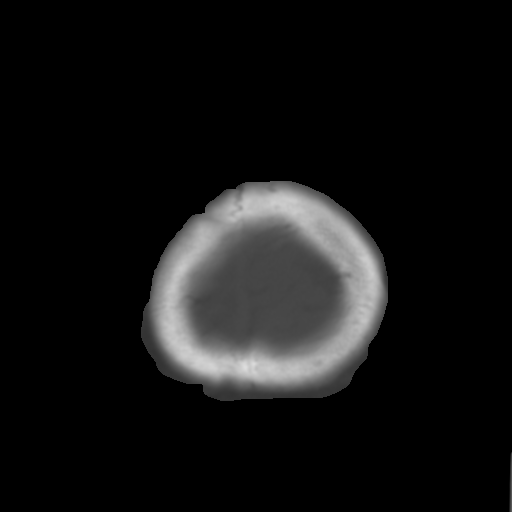
[im 29/32  brain]
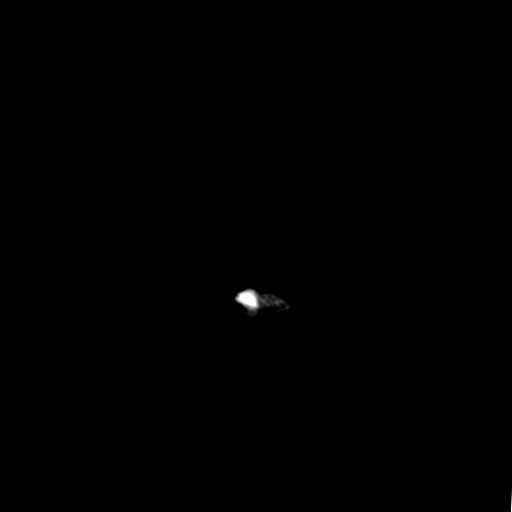

[Series 7: sagittal soft tissue · sagittal · 0.34mm/px · 3 of 57 slices shown]
[im 7/57  brain]
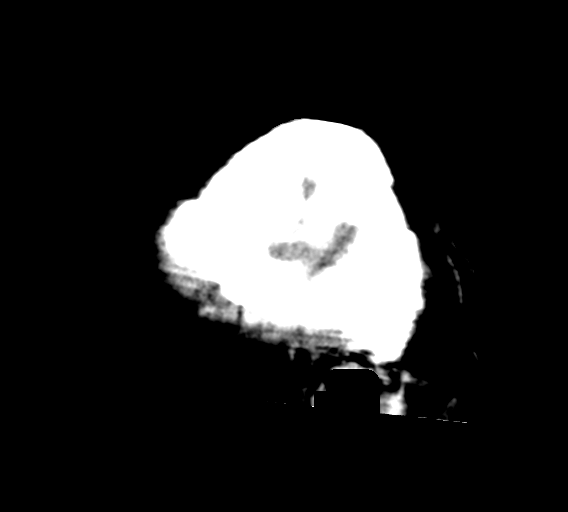
[im 13/57  brain]
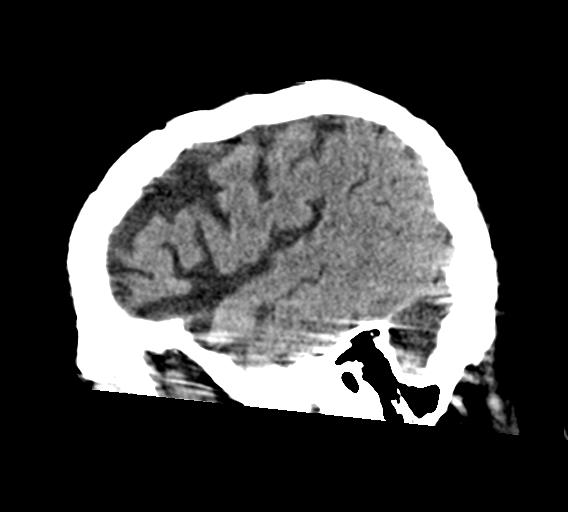
[im 20/57  brain]
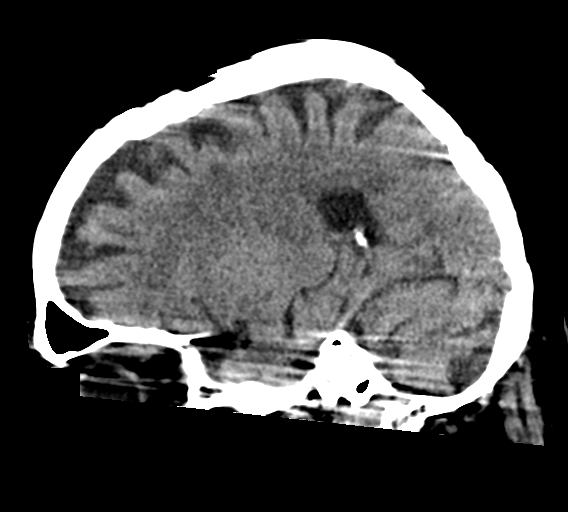

[Series 8: coronal soft tissue · coronal · 0.33mm/px · 3 of 63 slices shown]
[im 16/63  brain]
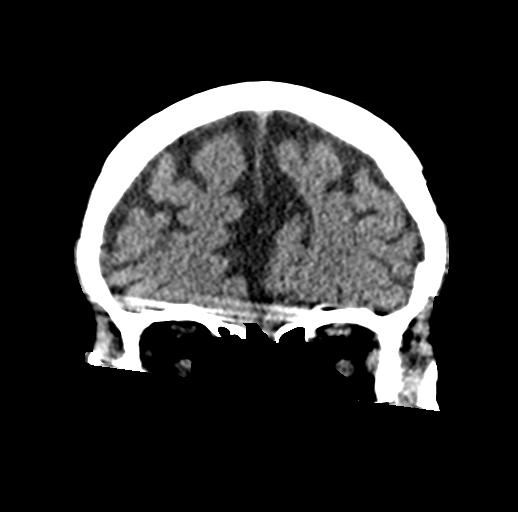
[im 32/63  brain]
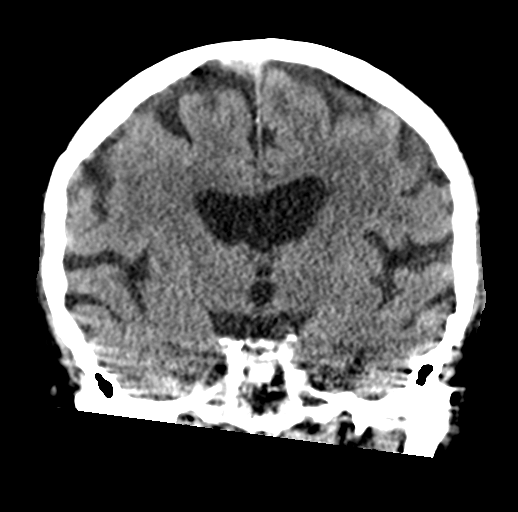
[im 47/63  brain]
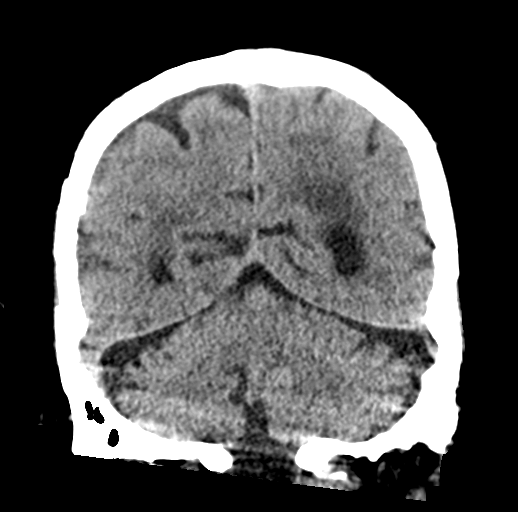

[16 of 47 positions shown; findings below may reference images not displayed]

FINDINGS: Brain: No evidence of acute infarction, hemorrhage, hydrocephalus,
extra-axial collection or mass lesion/mass effect. Remote right
inferior cerebellar infarct. Moderate generalized atrophy with mild
small vessel ischemic change in the supratentorial white matter

Vascular: Atherosclerotic calcification

Skull: Left parietal burr holes. No acute or aggressive finding.
Frontal scalp lipoma that is right para median

Sinuses/Orbits: Cataract resection on both sides.
IMPRESSION: 1. No acute finding or change from prior.
2. Atrophy and remote right cerebellar infarct.
